# Patient Record
Sex: Female | Born: 1955
Health system: Southern US, Community
[De-identification: ages and names within clinical notes are randomized; demographics above are authoritative.]

## PROBLEM LIST (undated history)

## (undated) DIAGNOSIS — K219 Gastro-esophageal reflux disease without esophagitis: Secondary | ICD-10-CM

## (undated) DIAGNOSIS — G5600 Carpal tunnel syndrome, unspecified upper limb: Secondary | ICD-10-CM

## (undated) DIAGNOSIS — M545 Low back pain, unspecified: Secondary | ICD-10-CM

## (undated) DIAGNOSIS — K259 Gastric ulcer, unspecified as acute or chronic, without hemorrhage or perforation: Secondary | ICD-10-CM

## (undated) DIAGNOSIS — G43909 Migraine, unspecified, not intractable, without status migrainosus: Secondary | ICD-10-CM

## (undated) DIAGNOSIS — R232 Flushing: Secondary | ICD-10-CM

## (undated) DIAGNOSIS — M79671 Pain in right foot: Secondary | ICD-10-CM

## (undated) DIAGNOSIS — I10 Essential (primary) hypertension: Secondary | ICD-10-CM

## (undated) DIAGNOSIS — M62838 Other muscle spasm: Secondary | ICD-10-CM

## (undated) DIAGNOSIS — M79672 Pain in left foot: Secondary | ICD-10-CM

## (undated) DIAGNOSIS — F329 Major depressive disorder, single episode, unspecified: Secondary | ICD-10-CM

## (undated) HISTORY — DX: Pain in left foot: M79.672

## (undated) HISTORY — PX: REVISION TOTAL KNEE ARTHROPLASTY: SHX767

## (undated) HISTORY — DX: Gastric ulcer, unspecified as acute or chronic, without hemorrhage or perforation: K25.9

## (undated) HISTORY — DX: Pain in right foot: M79.671

## (undated) HISTORY — DX: Low back pain, unspecified: M54.50

## (undated) HISTORY — DX: Gastro-esophageal reflux disease without esophagitis: K21.9

## (undated) HISTORY — DX: Low back pain: M54.5

## (undated) HISTORY — DX: Carpal tunnel syndrome, unspecified upper limb: G56.00

## (undated) HISTORY — PX: TONSILLECTOMY: SUR1361

## (undated) HISTORY — DX: Major depressive disorder, single episode, unspecified: F32.9

## (undated) HISTORY — DX: Flushing: R23.2

## (undated) HISTORY — PX: CARPAL TUNNEL RELEASE: SHX101

## (undated) HISTORY — DX: Other muscle spasm: M62.838

## (undated) HISTORY — DX: Migraine, unspecified, not intractable, without status migrainosus: G43.909

## (undated) HISTORY — DX: Essential (primary) hypertension: I10

---

## 1999-02-22 ENCOUNTER — Other Ambulatory Visit: Admission: RE | Admit: 1999-02-22 | Discharge: 1999-02-22 | Payer: Self-pay | Admitting: *Deleted

## 2000-10-22 ENCOUNTER — Encounter (INDEPENDENT_AMBULATORY_CARE_PROVIDER_SITE_OTHER): Payer: Self-pay | Admitting: Specialist

## 2000-10-22 ENCOUNTER — Ambulatory Visit (HOSPITAL_BASED_OUTPATIENT_CLINIC_OR_DEPARTMENT_OTHER): Admission: RE | Admit: 2000-10-22 | Discharge: 2000-10-22 | Payer: Self-pay | Admitting: Plastic Surgery

## 2001-02-11 ENCOUNTER — Other Ambulatory Visit: Admission: RE | Admit: 2001-02-11 | Discharge: 2001-02-11 | Payer: Self-pay | Admitting: Gynecology

## 2001-09-30 ENCOUNTER — Ambulatory Visit (HOSPITAL_COMMUNITY): Admission: RE | Admit: 2001-09-30 | Discharge: 2001-09-30 | Payer: Self-pay | Admitting: Gastroenterology

## 2002-05-18 ENCOUNTER — Other Ambulatory Visit: Admission: RE | Admit: 2002-05-18 | Discharge: 2002-05-18 | Payer: Self-pay | Admitting: Gynecology

## 2004-01-25 ENCOUNTER — Ambulatory Visit: Payer: Self-pay | Admitting: Internal Medicine

## 2004-02-08 ENCOUNTER — Ambulatory Visit: Payer: Self-pay | Admitting: Internal Medicine

## 2004-05-10 ENCOUNTER — Other Ambulatory Visit: Admission: RE | Admit: 2004-05-10 | Discharge: 2004-05-10 | Payer: Self-pay | Admitting: Gynecology

## 2005-03-23 ENCOUNTER — Other Ambulatory Visit: Admission: RE | Admit: 2005-03-23 | Discharge: 2005-03-23 | Payer: Self-pay | Admitting: Obstetrics and Gynecology

## 2006-01-01 ENCOUNTER — Ambulatory Visit: Payer: Self-pay | Admitting: Internal Medicine

## 2006-01-08 ENCOUNTER — Encounter: Admission: RE | Admit: 2006-01-08 | Discharge: 2006-01-08 | Payer: Self-pay | Admitting: Internal Medicine

## 2006-02-04 ENCOUNTER — Ambulatory Visit: Payer: Self-pay | Admitting: Internal Medicine

## 2006-07-16 ENCOUNTER — Ambulatory Visit: Payer: Self-pay | Admitting: Internal Medicine

## 2006-09-02 ENCOUNTER — Ambulatory Visit: Payer: Self-pay | Admitting: Internal Medicine

## 2006-09-02 LAB — CONVERTED CEMR LAB
AST: 18 units/L (ref 0–37)
Albumin: 3.6 g/dL (ref 3.5–5.2)
Alkaline Phosphatase: 85 units/L (ref 39–117)
Basophils Absolute: 0 10*3/uL (ref 0.0–0.1)
Chloride: 113 meq/L — ABNORMAL HIGH (ref 96–112)
Creatinine, Ser: 0.8 mg/dL (ref 0.4–1.2)
Eosinophils Relative: 2.5 % (ref 0.0–5.0)
HCT: 38.5 % (ref 36.0–46.0)
Hgb A1c MFr Bld: 5.6 % (ref 4.6–6.0)
LDL Cholesterol: 96 mg/dL (ref 0–99)
MCHC: 35.4 g/dL (ref 30.0–36.0)
Neutrophils Relative %: 56.6 % (ref 43.0–77.0)
RBC: 4.4 M/uL (ref 3.87–5.11)
RDW: 12.3 % (ref 11.5–14.6)
Sodium: 143 meq/L (ref 135–145)
Total Bilirubin: 0.5 mg/dL (ref 0.3–1.2)
Total CHOL/HDL Ratio: 3.5
WBC: 6.1 10*3/uL (ref 4.5–10.5)

## 2006-09-10 ENCOUNTER — Ambulatory Visit: Payer: Self-pay | Admitting: Internal Medicine

## 2006-09-30 DIAGNOSIS — J309 Allergic rhinitis, unspecified: Secondary | ICD-10-CM | POA: Insufficient documentation

## 2006-10-16 ENCOUNTER — Ambulatory Visit: Payer: Self-pay | Admitting: Internal Medicine

## 2007-03-24 ENCOUNTER — Ambulatory Visit: Payer: Self-pay | Admitting: Internal Medicine

## 2007-03-24 DIAGNOSIS — N951 Menopausal and female climacteric states: Secondary | ICD-10-CM | POA: Insufficient documentation

## 2007-03-24 DIAGNOSIS — F32A Depression, unspecified: Secondary | ICD-10-CM | POA: Insufficient documentation

## 2007-03-24 DIAGNOSIS — R51 Headache: Secondary | ICD-10-CM | POA: Insufficient documentation

## 2007-03-24 DIAGNOSIS — F339 Major depressive disorder, recurrent, unspecified: Secondary | ICD-10-CM | POA: Insufficient documentation

## 2007-03-24 DIAGNOSIS — R519 Headache, unspecified: Secondary | ICD-10-CM | POA: Insufficient documentation

## 2007-03-24 DIAGNOSIS — F329 Major depressive disorder, single episode, unspecified: Secondary | ICD-10-CM | POA: Insufficient documentation

## 2007-03-24 HISTORY — DX: Depression, unspecified: F32.A

## 2007-05-20 ENCOUNTER — Ambulatory Visit: Payer: Self-pay | Admitting: Internal Medicine

## 2007-05-20 DIAGNOSIS — I10 Essential (primary) hypertension: Secondary | ICD-10-CM | POA: Insufficient documentation

## 2007-07-28 ENCOUNTER — Encounter: Payer: Self-pay | Admitting: Internal Medicine

## 2007-09-10 ENCOUNTER — Telehealth: Payer: Self-pay | Admitting: Internal Medicine

## 2007-09-11 ENCOUNTER — Ambulatory Visit: Payer: Self-pay | Admitting: Internal Medicine

## 2007-10-09 ENCOUNTER — Ambulatory Visit: Payer: Self-pay | Admitting: Internal Medicine

## 2007-12-24 ENCOUNTER — Encounter: Payer: Self-pay | Admitting: Internal Medicine

## 2008-01-21 ENCOUNTER — Telehealth: Payer: Self-pay | Admitting: Internal Medicine

## 2008-01-22 ENCOUNTER — Ambulatory Visit: Payer: Self-pay | Admitting: Internal Medicine

## 2008-01-29 ENCOUNTER — Telehealth: Payer: Self-pay | Admitting: *Deleted

## 2008-02-11 ENCOUNTER — Telehealth: Payer: Self-pay | Admitting: *Deleted

## 2008-02-18 ENCOUNTER — Ambulatory Visit: Payer: Self-pay

## 2008-10-11 ENCOUNTER — Encounter: Payer: Self-pay | Admitting: Internal Medicine

## 2009-01-27 ENCOUNTER — Telehealth: Payer: Self-pay | Admitting: Internal Medicine

## 2009-02-15 ENCOUNTER — Ambulatory Visit: Payer: Self-pay | Admitting: Internal Medicine

## 2009-12-21 ENCOUNTER — Encounter: Payer: Self-pay | Admitting: Internal Medicine

## 2010-01-09 ENCOUNTER — Telehealth: Payer: Self-pay | Admitting: Internal Medicine

## 2010-02-24 ENCOUNTER — Ambulatory Visit: Payer: Self-pay | Admitting: Family Medicine

## 2010-03-10 ENCOUNTER — Telehealth: Payer: Self-pay | Admitting: Internal Medicine

## 2010-04-11 NOTE — Consult Note (Signed)
Summary: [facility]  [facility] [place]   Imported By: Maryln Gottron 12/29/2009 13:54:09  _____________________________________________________________________  External Attachment:    Type:   Image     Comment:   External Document

## 2010-04-11 NOTE — Progress Notes (Signed)
Summary: see my son tomorrow?  Phone Note Call from Patient   Caller: vm Summary of Call: Need sport physical for my son.  Any way I can get him in tomorrow pm?  Debra Trevino, 4 yr. old, Also to schedulers.   Initial call taken by: Rudy Jew, RN,  January 09, 2010 4:51 PM  Follow-up for Phone Call        may add at 4 pm Follow-up by: Stacie Glaze MD,  January 09, 2010 5:06 PM

## 2010-04-13 NOTE — Assessment & Plan Note (Signed)
Summary: EAR PAIN // RS   Vital Signs:  Patient profile:   55 year old female Weight:      148 pounds Temp:     97.8 degrees F oral BP sitting:   112 / 70  (left arm) Cuff size:   regular  Vitals Entered By: Sid Falcon LPN (February 24, 2010 2:47 PM)  Hearing Screen  20db HL: Left  500 hz: 20db 1000 hz: 20db 2000 hz: 20db 4000 hz: 5 Right  500 hz: 15 1000 hz: 15 2000 hz: 15 4000 hz: 10   Hearing Testing Entered By: Sid Falcon LPN (February 24, 2010 3:14 PM)   History of Present Illness: Several months hx of R ear pain and pressure.  ?mild loss of hearing. No vertigo.  Chronic sinus congestion.  No tinnitus. Pain is poorly localized.  Not exacerbated with eating. Aleve helped.  No other alleviating factors.  Allergies: 1)  ! Penicillin 2)  ! Codeine  Past History:  Past Medical History: Last updated: 05/20/2007 Allergic rhinitis memnopause Depression Headache carple tunnel Hypertension  Past Surgical History: Last updated: 09/30/2006 Tonsillectomy EGD-09/30/2001  Family History: Last updated: 03/24/2007 mother    MVA   renal cell CA father    CMT neuromuscular dz  Social History: Last updated: 09/30/2006 Never Smoked Alcohol use-yes Drug use-no Regular exercise-yes Married  Risk Factors: Exercise: yes (09/30/2006)  Risk Factors: Smoking Status: never (02/15/2009) Passive Smoke Exposure: no (03/24/2007) PMH-FH-SH reviewed for relevance  Review of Systems  The patient denies anorexia, fever, weight loss, vision loss, chest pain, and headaches.    Physical Exam  General:  Well-developed,well-nourished,in no acute distress; alert,appropriate and cooperative throughout examination Head:  Normocephalic and atraumatic without obvious abnormalities. No apparent alopecia or balding.  Ears:  External ear exam shows no significant lesions or deformities.  Otoscopic examination reveals clear canals, tympanic membranes are intact bilaterally  without bulging, retraction, inflammation or discharge. Hearing is grossly normal bilaterally. Mouth:  Oral mucosa and oropharynx without lesions or exudates.  Teeth in good repair.  Click with palpation over R TMJ.\par Neck:  No deformities, masses, or tenderness noted. Lungs:  Normal respiratory effort, chest expands symmetrically. Lungs are clear to auscultation, no crackles or wheezes. Heart:  Normal rate and regular rhythm. S1 and S2 normal without gallop, murmur, click, rub or other extra sounds. Cervical Nodes:  No lymphadenopathy noted   Impression & Recommendations:  Problem # 1:  FACIAL PAIN, ATYPICAL (ICD-350.2) Assessment New suspect sec to TMJ.  No signs of ear infection. Try antiinflammatory.  consider night use of mouth guard. Oral surgeon referral if persists.  Problem # 2:  DECREASED HEARING (ICD-389.9) subjective.  Audiometry does not confirm hearing loss. Orders: Audiometry [phone])  Complete Medication List: 1)  Prempro 0.45-1.5 Mg Tabs (Conj estrog-medroxyprogest ace) .... .take as directed 2)  Ziac 2.5-6.25 Mg Tabs (Bisoprolol-hydrochlorothiazide) .... Once daily 3)  Pristiq 50 Mg Tb24 (Desvenlafaxine succinate) .... One by mouth daily 4)  Meloxicam 15 Mg Tabs (Meloxicam) .... One by mouth once daily as needed  Patient Instructions: 1)  Continue antiinflammatory such as Aleve. 2)  Avoid hard to chew foods. 3)  Consider mouth guard for any teeth grinding. Prescriptions: MELOXICAM 15 MG TABS (MELOXICAM) one by mouth once daily as needed  #30 x 2   Entered and Authorized by:   Evelena Peat MD   Signed by:   Evelena Peat MD on 02/24/2010   Method used:   Electronically to  [facility] # [address] (retail)       [address]       [place], [place]  [address]       Ph: [phone]       Fax: [phone]   RxID:   [id number]    Orders Added: 1)  Audiometry [92552] 2)  Est. Patient Level III [84132]

## 2010-04-13 NOTE — Progress Notes (Signed)
Summary: cough  Phone Note Call from Patient   Caller: Patient Call For: Dr. Caryl Never Reason for Call: Referral Summary of Call: [facility] [phone] Has had URI and now a non productive cough that keeps her up all night. Asking for a cough syrup. Initial call taken by: Lynann Beaver CMA AAMA,  March 10, 2010 10:56 AM  Follow-up for Phone Call        try Mucinex DM.  Needs to be seen if that is not sufficient Follow-up by: Evelena Peat MD,  March 10, 2010 1:09 PM    New/Updated Medications: MUCINEX DM 30-600 MG XR12H-TAB (DEXTROMETHORPHAN-GUAIFENESIN) as directed Prescriptions: MUCINEX DM 30-600 MG XR12H-TAB (DEXTROMETHORPHAN-GUAIFENESIN) as directed  #20 x prn   Entered by:   Lynann Beaver CMA AAMA   Authorized by:   Evelena Peat MD   Signed by:   Lynann Beaver CMA AAMA on 03/10/2010   Method used:   Electronically to        [id number] (retail)       [address]       [place], [place]  [address]       Ph: [phone]       Fax: [phone]   RxID:   [id number]

## 2010-04-13 NOTE — Therapy (Signed)
Summary: Audiometry/[facility]  Audiometry/[facility]   Imported By: Lanelle Bal 03/03/2010 09:16:27  _____________________________________________________________________  External Attachment:    Type:   Image     Comment:   External Document

## 2010-07-21 ENCOUNTER — Other Ambulatory Visit: Payer: Self-pay | Admitting: Internal Medicine

## 2010-07-28 NOTE — Op Note (Signed)
[place]  Patient:    Debra Trevino, Debra Trevino                         MRN: [id number] Proc. Date: 10/22/00 Attending:  Mary A. Contogiannis, M.D.                           Operative Report  PREOPERATIVE DIAGNOSIS:  A 1.3 cm basal cell carcinoma, left lower leg.  POSTOPERATIVE DIAGNOSIS:  A 1.5 cm basal cell carcinoma, left lower leg.  PROCEDURES: 1. Excision of 1.5 cm basal cell carcinoma, left lower leg, with    intraoperative frozen section diagnosis. 2. Complex closure of 4.5 cm left lower leg incision.  SURGEON:  Mary A. Contogiannis, M.D.  ANESTHESIA:  1% lidocaine with epinephrine.  COMPLICATIONS:  None.  INDICATIONS FOR PROCEDURE:  The patient is a 55 year old Caucasian female who had a skin lesion on the lower leg biopsied by Dr. Campbell Stall.  The biopsy indicates it is a basal cell carcinoma.  She presents to have this lesion re-excised.  DESCRIPTION OF PROCEDURE:  The patient was brought in the operating room and placed on the OR table in supine position.  The left lower extremity was prepped with Betadine and draped in a sterile fashion.  The skin and subcutaneous tissues in the area of the basal cell cancer were injected with 1% lidocaine with epinephrine.  After adequate hemostasis and anesthesia had taken effect, the procedure was begun.  Using loupe magnification, the skin lesion was excised with what appeared to be 1 mm of normal skin all around the cancer.  This was excised full-thickness through the skin into the subcutaneous tissues.  The specimen was then marked at the 12 oclock position and sent to the pathology department for an intraoperative frozen section diagnosis.  After consultation with the pathologist, Dr. Francoise Schaumann, he was able to clear the 12 and 6 oclock margins.  However, the pathologist noted that he could not fully clear the 3 and 9 oclock margins, mostly because of difficulty with preparing the frozen section  specimen.  He, however, felt that if I took another millimeter or so of skin for a margin in this area and sent it for permanent pathologic section evaluation, that I would be clear of the cancer.  This new margin would be too small to prepare an adequate frozen section on it.  I discussed this with the patient, and she understood and was in agreement with it.  I therefore proceeded to re-excise the 12 oclock to 6 oclock and then the 6 oclock to 12 oclock margins, which included the 3 oclock and 9 oclock margin, respectively.  These were then properly marked with ink on the internal aspect of the margin.  The wound was then undermined for closure.  The incision was closed in complex fashion.  The deeper subcutaneous and superficial fascial layers were closed using 3-0 Monocryl suture.  The dermal layer was then closed with 3-0 Monocryl suture.  The skin was closed with 4-0 Monocryl in an intracuticular stitch.  The incision did have to be lengthened a little in order to provide a nice closure for the patient.  The incision was then dressed with benzoin and Steri-Strips, followed by 4 x 4 gauze.  There were no complications.  The patient tolerated the procedure well.  The patient was then taught proper wound care and discharged home in stable condition.  Follow-up appointment will be tomorrow in the office. DD:  10/22/00 TD:  10/23/00 Job: [id number]

## 2010-09-05 ENCOUNTER — Other Ambulatory Visit: Payer: Self-pay | Admitting: Family Medicine

## 2010-09-05 ENCOUNTER — Other Ambulatory Visit: Payer: Self-pay | Admitting: Internal Medicine

## 2010-09-07 NOTE — Telephone Encounter (Signed)
rx already sent. 

## 2010-09-08 ENCOUNTER — Other Ambulatory Visit: Payer: Self-pay | Admitting: *Deleted

## 2010-09-08 MED ORDER — MELOXICAM 15 MG PO TABS: 15.0000 mg | ORAL_TABLET | Freq: Every day | ORAL | Status: DC

## 2010-11-25 ENCOUNTER — Other Ambulatory Visit: Payer: Self-pay | Admitting: Internal Medicine

## 2010-12-22 ENCOUNTER — Encounter: Payer: Self-pay | Admitting: Internal Medicine

## 2010-12-30 ENCOUNTER — Other Ambulatory Visit: Payer: Self-pay | Admitting: Internal Medicine

## 2011-02-20 ENCOUNTER — Other Ambulatory Visit: Payer: Self-pay | Admitting: Internal Medicine

## 2011-04-01 ENCOUNTER — Other Ambulatory Visit: Payer: Self-pay | Admitting: Internal Medicine

## 2011-04-19 ENCOUNTER — Other Ambulatory Visit: Payer: Self-pay | Admitting: Internal Medicine

## 2011-06-01 ENCOUNTER — Other Ambulatory Visit: Payer: Self-pay | Admitting: Internal Medicine

## 2011-07-01 ENCOUNTER — Other Ambulatory Visit: Payer: Self-pay | Admitting: Internal Medicine

## 2011-11-08 ENCOUNTER — Other Ambulatory Visit: Payer: Self-pay | Admitting: Internal Medicine

## 2011-11-13 ENCOUNTER — Telehealth: Payer: Self-pay | Admitting: Internal Medicine

## 2011-11-13 MED ORDER — LORAZEPAM 0.5 MG PO TABS: 0.5000 mg | ORAL_TABLET | Freq: Two times a day (BID) | ORAL | Status: AC | PRN

## 2011-11-13 MED ORDER — MELOXICAM 15 MG PO TABS: 15.0000 mg | ORAL_TABLET | Freq: Every day | ORAL | Status: DC

## 2011-11-13 NOTE — Telephone Encounter (Signed)
Caller: [name]/Patient; Patient Name: Debra Trevino; PCP: Darryll Capers (Adults only); Best Callback Phone Number: [phone]; Is having anxiety and is requesting medication for this and adjustment with Primpro 0.45-1.5. Initially was given the Primpro for headaches that controlled them but the headaches have started coming back-Primpro does not seem to help anymore. Also requesting refill of Mobic 15 mg. She believes Dr. Lovell Sheehan is the presciber for it.  She has a few pills left and only takes prn.  Life events with teenagers making patient not sleeping well and stomach is churning; Triaged using Anxiety with a disposition to see in 24 hours due to feeling overwhelmed and unable to calm down. Care advice given. Patient demonstrated understanding.  Per protocol, with Dr. Lovell Sheehan being out of office,  attempted to schedule in 24 hours with Adline Mango PA with no appointments available. Patient's last appointment with office was on 02/24/2010.  OFFICE: PLEASE FOLLOW UP WITH THE PATIENT FOR THE FOLLOWING; 1- HAS NEED TO BE SEEN WITHIN 24 HOURS FOR ANXIETY WITH NO APPOINTMENT AVAILABLE WITH EITHER PROVIDER TO BE USED PER PROTOCOL; 2-REQUESTING PRIMPRO 0.45-1.5 TO POSSIBLE BE ADJUSTED BECAUSE IS NO LONGER CONTROLLING HEADACHES; 3-MOBIC 15 MG NEEDS REFILLING-PATIENT HAS SEVERAL LEFT AND USES PRN.  DR. Lovell Sheehan PRESCRIBED FOR BOTH MEDICA TONS.  LAST APPOINTMENT IS 02/24/2010.  [facility] AT [phone].

## 2011-11-13 NOTE — Telephone Encounter (Signed)
Per Dr. Amador Cunas pt's Mobic can be refilled and lorazepam 0.5 mg take bid can be called in for anxiety. Pt is to schedule f/u with Dr. Lovell Sheehan in 2-4 weeks.  Called and spoke with pt and pt is aware.    Mobic and Lorazepam called in to pharmacy.

## 2011-11-27 ENCOUNTER — Encounter: Payer: Self-pay | Admitting: Internal Medicine

## 2011-11-27 ENCOUNTER — Ambulatory Visit (INDEPENDENT_AMBULATORY_CARE_PROVIDER_SITE_OTHER): Payer: BC Managed Care – PPO | Admitting: Internal Medicine

## 2011-11-27 VITALS — BP 110/70 | HR 72 | Temp 98.2°F | Resp 16 | Ht 65.0 in | Wt 144.0 lb

## 2011-11-27 DIAGNOSIS — Z23 Encounter for immunization: Secondary | ICD-10-CM

## 2011-11-27 DIAGNOSIS — I10 Essential (primary) hypertension: Secondary | ICD-10-CM

## 2011-11-27 DIAGNOSIS — G44019 Episodic cluster headache, not intractable: Secondary | ICD-10-CM

## 2011-11-27 DIAGNOSIS — N951 Menopausal and female climacteric states: Secondary | ICD-10-CM

## 2011-11-27 MED ORDER — CITALOPRAM HYDROBROMIDE 20 MG PO TABS: 10.0000 mg | ORAL_TABLET | Freq: Every day | ORAL | Status: DC

## 2011-11-27 MED ORDER — ALPRAZOLAM 0.25 MG PO TABS: 0.2500 mg | ORAL_TABLET | Freq: Two times a day (BID) | ORAL | Status: DC | PRN

## 2011-11-27 MED ORDER — CONJ ESTROG-MEDROXYPROGEST ACE 0.625-2.5 MG PO TABS: 1.0000 | ORAL_TABLET | Freq: Every day | ORAL | Status: DC

## 2011-11-27 NOTE — Progress Notes (Signed)
Subjective:    Patient ID: Debra Trevino, female    DOB: 08-12-1955, 56 y.o.   MRN: [id number]  HPI Increased HA with prior relationship to HRT dosing also complicated by increased stressors in her life she states that she may children have caused quite a bit of stress and anxiety lately and she is having episodic panic attacks.  She called prior to this office visit to reestablish and asks for something to be called in Ativan was called and she states that she had minor hallucinosis with Ativan     Review of Systems  Constitutional: Negative for activity change, appetite change and fatigue.  HENT: Negative for ear pain, congestion, neck pain, postnasal drip and sinus pressure.   Eyes: Negative for redness and visual disturbance.  Respiratory: Negative for cough, shortness of breath and wheezing.   Gastrointestinal: Negative for abdominal pain and abdominal distention.  Genitourinary: Negative for dysuria, frequency and menstrual problem.  Musculoskeletal: Negative for myalgias, joint swelling and arthralgias.  Skin: Negative for rash and wound.  Neurological: Negative for dizziness, weakness and headaches.  Hematological: Negative for adenopathy. Does not bruise/bleed easily.  Psychiatric/Behavioral: Negative for disturbed wake/sleep cycle and decreased concentration.   No past medical history on file.  History   Social History  . Marital Status: Married    Spouse Name: N/A    Number of Children: N/A  . Years of Education: N/A   Occupational History  . Not on file.   Social History Main Topics  . Smoking status: Never Smoker   . Smokeless tobacco: Not on file  . Alcohol Use: Not on file  . Drug Use: Not on file  . Sexually Active: Not on file   Other Topics Concern  . Not on file   Social History Narrative  . No narrative on file    No past surgical history on file.  No family history on file.  Allergies  Allergen Reactions  . Codeine   . Penicillins      Current Outpatient Prescriptions on File Prior to Visit  Medication Sig Dispense Refill  . bisoprolol-hydrochlorothiazide (ZIAC) 2.5-6.25 MG per tablet TAKE ONE TABLET BY MOUTH ONE TIME DAILY  30 tablet  6  . citalopram (CELEXA) 20 MG tablet Take 0.5 tablets (10 mg total) by mouth daily.  30 tablet  3  . meloxicam (MOBIC) 15 MG tablet Take 1 tablet (15 mg total) by mouth daily.  30 tablet  1    BP 110/70  Pulse 72  Temp 98.2 F (36.8 C)  Resp 16  Ht 5\' 5"  (1.651 m)  Wt 144 lb (65.318 kg)  BMI 23.96 kg/m2        Objective:   Physical Exam  Constitutional: She is oriented to person, place, and time. She appears well-developed and well-nourished. No distress.  HENT:  Head: Normocephalic and atraumatic.  Right Ear: External ear normal.  Left Ear: External ear normal.  Nose: Nose normal.  Mouth/Throat: Oropharynx is clear and moist.  Eyes: Conjunctivae normal and EOM are normal. Pupils are equal, round, and reactive to light.  Neck: Normal range of motion. Neck supple. No JVD present. No tracheal deviation present. No thyromegaly present.  Cardiovascular: Normal rate, regular rhythm, normal heart sounds and intact distal pulses.   No murmur heard. Pulmonary/Chest: Effort normal and breath sounds normal. She has no wheezes. She exhibits no tenderness.  Abdominal: Soft. Bowel sounds are normal.  Musculoskeletal: Normal range of motion. She exhibits no edema and no  tenderness.  Lymphadenopathy:    She has no cervical adenopathy.  Neurological: She is alert and oriented to person, place, and time. She has normal reflexes. No cranial nerve deficit.  Skin: Skin is warm and dry. She is not diaphoretic.  Psychiatric: She has a normal mood and affect. Her behavior is normal.          Assessment & Plan:  Plan is to treat the underlying anxiety disorder with low-dose Celexa and see if her hormones and flashes even out on a serotonin uptake inhibitor if they do not then we will  titrate the Prempro to the next dose 6.25 of Premarin.  We discussed implications of panic anxiety postmenopausal disorders and vasomotor motor symptoms and the risks and benefits of hormone replacement versus treatment of anxiety.  I have spent more than 30 minutes examining this patient face-to-face of which over half was spent in counseling

## 2011-11-27 NOTE — Patient Instructions (Addendum)
The patient is instructed to continue all medications as prescribed. Schedule followup with check out clerk upon leaving the clinic  

## 2012-01-11 ENCOUNTER — Other Ambulatory Visit: Payer: Self-pay | Admitting: Internal Medicine

## 2012-01-28 ENCOUNTER — Ambulatory Visit: Payer: BC Managed Care – PPO | Admitting: Internal Medicine

## 2012-05-28 ENCOUNTER — Other Ambulatory Visit: Payer: Self-pay | Admitting: Internal Medicine

## 2012-06-05 ENCOUNTER — Other Ambulatory Visit: Payer: Self-pay | Admitting: Internal Medicine

## 2012-07-23 ENCOUNTER — Other Ambulatory Visit: Payer: Self-pay | Admitting: Internal Medicine

## 2012-09-30 ENCOUNTER — Other Ambulatory Visit: Payer: Self-pay | Admitting: Internal Medicine

## 2012-11-17 ENCOUNTER — Telehealth: Payer: Self-pay | Admitting: Internal Medicine

## 2012-11-17 NOTE — Telephone Encounter (Signed)
Ok with me 

## 2012-11-17 NOTE — Telephone Encounter (Signed)
PT would like to see Dr. Caryl Never for her physical, due to Dr. Lovell Sheehan availability. May I schedule this?

## 2012-11-18 ENCOUNTER — Other Ambulatory Visit: Payer: Self-pay | Admitting: Internal Medicine

## 2012-11-18 NOTE — Telephone Encounter (Signed)
Scheduled

## 2012-11-27 ENCOUNTER — Other Ambulatory Visit (INDEPENDENT_AMBULATORY_CARE_PROVIDER_SITE_OTHER): Payer: BC Managed Care – PPO

## 2012-11-27 ENCOUNTER — Other Ambulatory Visit: Payer: Self-pay | Admitting: Internal Medicine

## 2012-11-27 DIAGNOSIS — Z0000 Encounter for general adult medical examination without abnormal findings: Secondary | ICD-10-CM

## 2012-11-27 LAB — TSH: TSH: 1.66 u[IU]/mL (ref 0.35–5.50)

## 2012-11-27 LAB — HEPATIC FUNCTION PANEL
ALT: 19 U/L (ref 0–35)
AST: 18 U/L (ref 0–37)
Bilirubin, Direct: 0 mg/dL (ref 0.0–0.3)
Total Bilirubin: 0.2 mg/dL — ABNORMAL LOW (ref 0.3–1.2)

## 2012-11-27 LAB — BASIC METABOLIC PANEL
BUN: 12 mg/dL (ref 6–23)
CO2: 27 mEq/L (ref 19–32)
Chloride: 107 mEq/L (ref 96–112)
Creatinine, Ser: 0.7 mg/dL (ref 0.4–1.2)
Glucose, Bld: 82 mg/dL (ref 70–99)

## 2012-11-27 LAB — CBC WITH DIFFERENTIAL/PLATELET
Eosinophils Absolute: 0.2 10*3/uL (ref 0.0–0.7)
MCHC: 34.2 g/dL (ref 30.0–36.0)
MCV: 90.8 fl (ref 78.0–100.0)
Monocytes Absolute: 0.7 10*3/uL (ref 0.1–1.0)
Neutrophils Relative %: 58.5 % (ref 43.0–77.0)
Platelets: 235 10*3/uL (ref 150.0–400.0)
RDW: 13.3 % (ref 11.5–14.6)

## 2012-11-27 LAB — POCT URINALYSIS DIPSTICK
Bilirubin, UA: NEGATIVE
Blood, UA: NEGATIVE
Glucose, UA: NEGATIVE
Nitrite, UA: NEGATIVE
Spec Grav, UA: 1.025

## 2012-11-27 LAB — LIPID PANEL
Cholesterol: 150 mg/dL (ref 0–200)
Triglycerides: 72 mg/dL (ref 0.0–149.0)

## 2012-12-04 ENCOUNTER — Ambulatory Visit (INDEPENDENT_AMBULATORY_CARE_PROVIDER_SITE_OTHER): Payer: BC Managed Care – PPO | Admitting: Family Medicine

## 2012-12-04 ENCOUNTER — Encounter: Payer: Self-pay | Admitting: Family Medicine

## 2012-12-04 VITALS — BP 118/70 | HR 64 | Temp 97.8°F | Wt 155.0 lb

## 2012-12-04 DIAGNOSIS — Z23 Encounter for immunization: Secondary | ICD-10-CM

## 2012-12-04 DIAGNOSIS — Z0000 Encounter for general adult medical examination without abnormal findings: Secondary | ICD-10-CM

## 2012-12-04 MED ORDER — CITALOPRAM HYDROBROMIDE 20 MG PO TABS: ORAL_TABLET | ORAL | Status: DC

## 2012-12-04 MED ORDER — MELOXICAM 15 MG PO TABS: 15.0000 mg | ORAL_TABLET | Freq: Every day | ORAL | Status: DC

## 2012-12-04 NOTE — Patient Instructions (Addendum)
Schedule mammogram Check on insurance coverage for shingles vaccine Confirm date of last colonoscopy Set up GYN followup regarding Pap smear

## 2012-12-04 NOTE — Progress Notes (Signed)
  Subjective:    Patient ID: Debra Trevino, female    DOB: Aug 20, 1955, 57 y.o.   MRN: [id number]  HPI Patient seen for complete physical. She plans to set up GYN followup soon. She states she had colonoscopy less than 10 years ago. Nonsmoker. Plays tennis for exercise. Needs flu vaccine. Last tetanus over 10 years ago. Last mammogram 2 years ago.  Family history reviewed. No family history of premature CAD. Patient sees dermatologist yearly for skin cancer screening.  No past medical history on file. No past surgical history on file.  reports that she has never smoked. She does not have any smokeless tobacco history on file. Her alcohol and drug histories are not on file. family history is not on file. Allergies  Allergen Reactions  . Codeine   . Penicillins       Review of Systems  Constitutional: Negative for fever, activity change, appetite change, fatigue and unexpected weight change.  HENT: Negative for hearing loss, ear pain, sore throat and trouble swallowing.   Eyes: Negative for visual disturbance.  Respiratory: Negative for cough and shortness of breath.   Cardiovascular: Negative for chest pain and palpitations.  Gastrointestinal: Negative for abdominal pain, diarrhea, constipation and blood in stool.  Endocrine: Negative for polydipsia and polyuria.  Genitourinary: Negative for dysuria and hematuria.  Musculoskeletal: Negative for myalgias, back pain and arthralgias.  Skin: Negative for rash.  Neurological: Negative for dizziness, syncope and headaches.  Hematological: Negative for adenopathy.  Psychiatric/Behavioral: Negative for confusion and dysphoric mood.       Objective:   Physical Exam  Constitutional: She is oriented to person, place, and time. She appears well-developed and well-nourished.  HENT:  Head: Normocephalic and atraumatic.  Eyes: EOM are normal. Pupils are equal, round, and reactive to light.  Neck: Normal range of motion. Neck supple. No  thyromegaly present.  Cardiovascular: Normal rate, regular rhythm and normal heart sounds.   No murmur heard. Pulmonary/Chest: Breath sounds normal. No respiratory distress. She has no wheezes. She has no rales.  Abdominal: Soft. Bowel sounds are normal. She exhibits no distension and no mass. There is no tenderness. There is no rebound and no guarding.  Genitourinary:  Per GYN  Musculoskeletal: Normal range of motion. She exhibits no edema.  Lymphadenopathy:    She has no cervical adenopathy.  Neurological: She is alert and oriented to person, place, and time. She displays normal reflexes. No cranial nerve deficit.  Skin: No rash noted.  Psychiatric: She has a normal mood and affect. Her behavior is normal. Judgment and thought content normal.          Assessment & Plan:  Complete physical. Vaccine given. Tetanus booster given. She will check on coverage for shingles vaccine. Set up mammogram. Set up GYN followup

## 2013-03-04 ENCOUNTER — Other Ambulatory Visit: Payer: Self-pay | Admitting: Internal Medicine

## 2013-03-31 ENCOUNTER — Other Ambulatory Visit: Payer: Self-pay | Admitting: Internal Medicine

## 2013-04-24 ENCOUNTER — Ambulatory Visit (INDEPENDENT_AMBULATORY_CARE_PROVIDER_SITE_OTHER): Payer: BC Managed Care – PPO | Admitting: Family Medicine

## 2013-04-24 ENCOUNTER — Telehealth: Payer: Self-pay | Admitting: Internal Medicine

## 2013-04-24 ENCOUNTER — Encounter: Payer: Self-pay | Admitting: Family Medicine

## 2013-04-24 VITALS — BP 120/64 | HR 71 | Temp 97.7°F | Ht 65.0 in | Wt 156.0 lb

## 2013-04-24 DIAGNOSIS — H8110 Benign paroxysmal vertigo, unspecified ear: Secondary | ICD-10-CM

## 2013-04-24 NOTE — Telephone Encounter (Signed)
Patient came into the office asking if someone could check her blood pressure. She said she was feeling dizzy all morning and did not take her blood pressure medicine because she was afraid to. I called [facility] to see if she could take her blood pressure and she told me that the patient would need an appointment. I told her what Stevie Kern had said to me and the pt told me she had to leave to pick up her son at [place]. I offered her an appointment for today and found an available appointment with Dr. Sarajane Jews at 1:30pm today, which I scheduled her for. It is now 12:28 and she told me that she would be back in an hour for her appointment and left.

## 2013-04-24 NOTE — Progress Notes (Signed)
   Subjective:    Patient ID: Debra Trevino, female    DOB: [birth year], 58 y.o.   MRN: [id number]  HPI Here for periods of "dizziness" which are difficult to classify. They started 2 months ago and have gradually been getting more frequent. Now for the past week she has been getting them every day. She describes them as feeling "out of sorts", sometimes lightheaded, sometimes just fatigue. She denies any chest pain or SOB or palpitations. No HA or blurred vision. Her BP is stable. She is active and plays a lot of tennis with no difficulties.    Review of Systems  Constitutional: Positive for fatigue.  HENT: Negative.   Eyes: Negative.   Respiratory: Negative.   Cardiovascular: Negative.   Gastrointestinal: Negative.   Endocrine: Negative.   Neurological: Positive for dizziness and light-headedness. Negative for tremors, seizures, syncope, facial asymmetry, speech difficulty, weakness, numbness and headaches.       Objective:   Physical Exam  Constitutional: She is oriented to person, place, and time. She appears well-developed and well-nourished.  HENT:  Right Ear: External ear normal.  Left Ear: External ear normal.  Nose: Nose normal.  Mouth/Throat: Oropharynx is clear and moist.  Eyes: Conjunctivae are normal. Pupils are equal, round, and reactive to light.  Neck: No thyromegaly present.  Cardiovascular: Normal rate, regular rhythm, normal heart sounds and intact distal pulses.   Pulmonary/Chest: Effort normal and breath sounds normal.  Lymphadenopathy:    She has no cervical adenopathy.  Neurological: She is alert and oriented to person, place, and time.          Assessment & Plan:  I am not sure how to characterize these symptoms. She may have very mild vertigo at times. Try Claritin D daily for the next 2 weeks. Recheck prn

## 2013-04-24 NOTE — Progress Notes (Signed)
Pre visit review using our clinic review tool, if applicable. No additional management support is needed unless otherwise documented below in the visit note. 

## 2013-06-29 ENCOUNTER — Other Ambulatory Visit: Payer: Self-pay | Admitting: Family Medicine

## 2013-07-03 ENCOUNTER — Other Ambulatory Visit: Payer: Self-pay | Admitting: Internal Medicine

## 2013-09-06 ENCOUNTER — Other Ambulatory Visit: Payer: Self-pay | Admitting: Internal Medicine

## 2013-10-02 ENCOUNTER — Other Ambulatory Visit: Payer: Self-pay | Admitting: Family Medicine

## 2014-01-22 ENCOUNTER — Other Ambulatory Visit: Payer: Self-pay | Admitting: Family Medicine

## 2014-03-11 ENCOUNTER — Other Ambulatory Visit: Payer: Self-pay | Admitting: Family Medicine

## 2014-04-07 ENCOUNTER — Other Ambulatory Visit: Payer: Self-pay

## 2014-04-07 NOTE — Telephone Encounter (Signed)
Rx request for Ziac table 2.5-6.25 mg tablet- Take 1 tablet by mouth one time daily #30  Pharm:  [facility]   Pls advise.

## 2014-04-08 MED ORDER — BISOPROLOL-HYDROCHLOROTHIAZIDE 2.5-6.25 MG PO TABS: 1.0000 | ORAL_TABLET | Freq: Every day | ORAL | Status: DC

## 2014-04-27 ENCOUNTER — Other Ambulatory Visit: Payer: Self-pay | Admitting: Family Medicine

## 2014-05-27 ENCOUNTER — Ambulatory Visit (INDEPENDENT_AMBULATORY_CARE_PROVIDER_SITE_OTHER)
Admission: RE | Admit: 2014-05-27 | Discharge: 2014-05-27 | Disposition: A | Discharge status: Home / Self Care | Payer: BLUE CROSS/BLUE SHIELD | Source: Ambulatory Visit | Attending: Family Medicine | Admitting: Family Medicine

## 2014-05-27 ENCOUNTER — Encounter: Payer: Self-pay | Admitting: Family Medicine

## 2014-05-27 ENCOUNTER — Ambulatory Visit (INDEPENDENT_AMBULATORY_CARE_PROVIDER_SITE_OTHER): Payer: BLUE CROSS/BLUE SHIELD | Admitting: Family Medicine

## 2014-05-27 VITALS — BP 112/68 | HR 63 | Temp 97.6°F | Ht 64.0 in | Wt 160.7 lb

## 2014-05-27 DIAGNOSIS — K219 Gastro-esophageal reflux disease without esophagitis: Secondary | ICD-10-CM

## 2014-05-27 DIAGNOSIS — R5382 Chronic fatigue, unspecified: Secondary | ICD-10-CM

## 2014-05-27 DIAGNOSIS — M25531 Pain in right wrist: Secondary | ICD-10-CM

## 2014-05-27 DIAGNOSIS — R232 Flushing: Secondary | ICD-10-CM

## 2014-05-27 DIAGNOSIS — N951 Menopausal and female climacteric states: Secondary | ICD-10-CM

## 2014-05-27 DIAGNOSIS — M545 Low back pain: Secondary | ICD-10-CM

## 2014-05-27 DIAGNOSIS — I10 Essential (primary) hypertension: Secondary | ICD-10-CM | POA: Diagnosis not present

## 2014-05-27 DIAGNOSIS — G8929 Other chronic pain: Secondary | ICD-10-CM

## 2014-05-27 DIAGNOSIS — F329 Major depressive disorder, single episode, unspecified: Secondary | ICD-10-CM

## 2014-05-27 DIAGNOSIS — F32A Depression, unspecified: Secondary | ICD-10-CM

## 2014-05-27 DIAGNOSIS — M62838 Other muscle spasm: Secondary | ICD-10-CM

## 2014-05-27 DIAGNOSIS — M25579 Pain in unspecified ankle and joints of unspecified foot: Secondary | ICD-10-CM

## 2014-05-27 HISTORY — DX: Gastro-esophageal reflux disease without esophagitis: K21.9

## 2014-05-27 LAB — BASIC METABOLIC PANEL
BUN: 15 mg/dL (ref 6–23)
CO2: 30 mEq/L (ref 19–32)
Calcium: 9.6 mg/dL (ref 8.4–10.5)
Chloride: 105 mEq/L (ref 96–112)
Creatinine, Ser: 0.89 mg/dL (ref 0.40–1.20)
GFR: 68.56 mL/min (ref >60.00)
Glucose, Bld: 103 mg/dL — ABNORMAL HIGH (ref 70–99)
POTASSIUM: 4.6 meq/L (ref 3.5–5.1)
SODIUM: 139 meq/L (ref 135–145)

## 2014-05-27 LAB — CBC
HEMATOCRIT: 41.5 % (ref 36.0–46.0)
Hemoglobin: 14.4 g/dL (ref 12.0–15.0)
MCHC: 34.7 g/dL (ref 30.0–36.0)
MCV: 90.7 fl (ref 78.0–100.0)
Platelets: 262 10*3/uL (ref 150.0–400.0)
RBC: 4.58 Mil/uL (ref 3.87–5.11)
RDW: 13.1 % (ref 11.5–15.5)
WBC: 7.6 10*3/uL (ref 4.0–10.5)

## 2014-05-27 LAB — TSH: TSH: 1.09 u[IU]/mL (ref 0.35–4.50)

## 2014-05-27 MED ORDER — TIZANIDINE HCL 2 MG PO CAPS: 2.0000 mg | ORAL_CAPSULE | Freq: Two times a day (BID) | ORAL | Status: DC | PRN

## 2014-05-27 NOTE — Progress Notes (Signed)
Pre visit review using our clinic review tool, if applicable. No additional management support is needed unless otherwise documented below in the visit note. 

## 2014-05-27 NOTE — Progress Notes (Signed)
HPI:  Debra Trevino is here to establish care. Used to see Dr. Arnoldo Morale. Has not had female gyn physical in some time. Was going to see gyn but would prefer to come here if possible.  Has no PMG in chart but reports many  chronic problems today:  GERD: -sees Dr. Earlean Shawl -has EGD scheduled next week with her GI doctor   Bilat Foot Pain: -plays tennis and overdid it -has morton neuroma -seeing podiatrist for this -meloxicam intermittently if has tennis match and wears orthotics and had inj  Chronic low back pain: -bilateral for a long time -constant mild, sometimes worse with certain acivites - tennis match, etc and uses valium for this - wants muscle relaxer to use prn -denies: radiation of back pain, weakness, numbness, bowel or bladder incon  Tight hamstrings: -takes muscle relaxer prn as needed -very active with tennis sometimes takes valium  CTS: -CTS R, saw hand specialist in the past - one told her to do surgery one told her not to -not using wrist braces  HTN: -meds: bisoprolol-hctz 2.5-6.25 -reports stable  Depressed Mood/Chronic fatigue: -anhedonia, depressed at time, feels like going back to sleep -on celexa for hot flashes  Sleep disorder: mild, wakes up at 3 Interest deficit/anhedonia: yes Guilt (worthlessness, hopelessness, regret):no Energy deficit:  yes Concentration deficit: yes Appetite disorder: no Psychomotor retardation or agitation: sluggish Suicidality: no -denies: fevers, weight loss, joint swelling  Hot flashes: -really bad every time she tries to wean the prempro  Migraines: -resolved on HRT for hot flashes  ROS negative for unless reported above: fevers, unintentional weight loss, hearing or vision loss, chest pain, palpitations, struggling to breath, hemoptysis, melena, hematochezia, hematuria, falls, loc, si, thoughts of self harm  Past Medical History  Diagnosis Date  . GERD (gastroesophageal reflux disease) 05/27/2014    Sees  Dr. Earlean Shawl   . Depression 03/24/2007    Qualifier: Diagnosis of  By: Arnoldo Morale MD, Balinda Quails   . Hot flashes     recur when stops HRT  . Migraines     resolved on HRT for hot flases  . HTN (hypertension)   . Bilateral foot pain     morton neuroma, seeing podiatrist  . Muscle spasm     very active, muscle spasm after tennis matches  . CTS (carpal tunnel syndrome)     has seen hand specilist  . Low back pain     chronic    Past Surgical History  Procedure Laterality Date  . Tonsillectomy      Family History  Problem Relation Age of Onset  . Charcot-Marie-Tooth disease Father   . Renal cancer Mother   . Hypertension    . Schizophrenia Brother     History   Social History  . Marital Status: Married    Spouse Name: N/A  . Number of Children: N/A  . Years of Education: N/A   Social History Main Topics  . Smoking status: Never Smoker   . Smokeless tobacco: Never Used  . Alcohol Use: Yes     Comment: glass of wine each day  . Drug Use: No  . Sexual Activity: Not on file   Other Topics Concern  . None   Social History Narrative   Work or School: 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      Home Situation: live with husband - boys off at college      Spiritual Beliefs: catholic      Lifestyle: lots  of exercise - tennis; diet is healthy           Current outpatient prescriptions:  .  aspirin 81 MG tablet, Take 81 mg by mouth daily., Disp: , Rfl:  .  bisoprolol-hydrochlorothiazide (ZIAC) 2.5-6.25 MG per tablet, Take 1 tablet by mouth daily., Disp: 30 tablet, Rfl: 2 .  citalopram (CELEXA) 20 MG tablet, TAKE ONE TABLET BY MOUTH ONE TIME DAILY , Disp: 30 tablet, Rfl: 0 .  meloxicam (MOBIC) 15 MG tablet, TAKE ONE TABLET BY MOUTH ONE TIME DAILY , Disp: 30 tablet, Rfl: 5 .  PREMPRO 0.45-1.5 MG per tablet, Take as instructed by physician and/or package instructions., Disp: 28 tablet, Rfl: 5 .  tizanidine (ZANAFLEX) 2 MG capsule, Take 1 capsule (2 mg total) by mouth 2  (two) times daily as needed for muscle spasms., Disp: 30 capsule, Rfl: 0  EXAM:  Filed Vitals:   05/27/14 1125  BP: 112/68  Pulse: 63  Temp: 97.6 F (36.4 C)    Body mass index is 27.57 kg/(m^2).  GENERAL: vitals reviewed and listed above, alert, oriented, appears well hydrated and in no acute distress  HEENT: atraumatic, conjunttiva clear, no obvious abnormalities on inspection of external nose and ears  NECK: no obvious masses on inspection  LUNGS: clear to auscultation bilaterally, no wheezes, rales or rhonchi, good air movement  CV: HRRR, no peripheral edema  MS: moves all extremities without noticeable abnormality, TTP in bilat lower lumbar paraspinal muscles, normal gait  PSYCH: pleasant and cooperative, no obvious depression or anxiety  ASSESSMENT AND PLAN:  Discussed the following assessment and plan:  Hot flashes -discussed risks long term HRT, she opted to continue  Essential hypertension -cont current tx, labs  Chronic low back pain - Plan: tizanidine (ZANAFLEX) 2 MG capsule, DG Lumbar Spine Complete -ok with prn muscle relaxer if used sparingly -discussed risks with nsaids - advised tylenol and decreasing use of Nsaids -advised plain films lumbar -given her multiple other MS, while unlikely, discuss and offered eval with rheum to rule out underlying systemic process, she prefers to hold off on this  Chronic fatigue - Plan: TSH, Basic metabolic panel, CBC (no diff) Depression -basic labs, CPE -discussed tx option for depression - may increase celexa or do CBT, she will let us know  Gastroesophageal reflux disease, esophagitis presence not specified  Pain in joint, ankle and foot, unspecified laterality -sees podiatrist  Right wrist pain -advised cock up brace as she is not interested in surgery, loose application  -We reviewed the PMH, PSH, FH, SH, Meds and Allergies. -We provided refills for any medications we will prescribe as needed. -We  addressed current concerns per orders and patient instructions. -We have asked for records for pertinent exams, studies, vaccines and notes from previous providers. -We have advised patient to follow up per instructions below.   -Patient advised to return or notify a doctor immediately if symptoms worsen or persist or new concerns arise.  Patient Instructions  BEFORE YOU LEAVE: -labs -physical in about 3 months - come fasting  -xray sheet -low back exercises  Vit D3 1000 IU daily  Where wrist brace very loosely at night  Back exercises at least 3 days per week  We recommend the following healthy lifestyle measures: - eat a healthy diet consisting of lots of vegetables, fruits, beans, nuts, seeds, healthy meats such as white chicken and fish and whole grains.  - avoid fried foods, fast food, processed foods, sodas, red meet and other fattening foods.  -  get a least 150 minutes of aerobic exercise per week.   Schedule mammogram  If you decide to see rheumatologist or sports medicine doctor please let me know        Colin Benton R.

## 2014-05-27 NOTE — Patient Instructions (Addendum)
BEFORE YOU LEAVE: -labs -physical in about 3 months - come fasting  -xray sheet -low back exercises  Vit D3 1000 IU daily  Where wrist brace very loosely at night  Back exercises at least 3 days per week  We recommend the following healthy lifestyle measures: - eat a healthy diet consisting of lots of vegetables, fruits, beans, nuts, seeds, healthy meats such as white chicken and fish and whole grains.  - avoid fried foods, fast food, processed foods, sodas, red meet and other fattening foods.  - get a least 150 minutes of aerobic exercise per week.   Schedule mammogram  If you decide to see rheumatologist or sports medicine doctor please let me know

## 2014-06-08 ENCOUNTER — Other Ambulatory Visit: Payer: Self-pay | Admitting: Family Medicine

## 2014-06-17 LAB — HM MAMMOGRAPHY: HM Mammogram: NEGATIVE

## 2014-06-22 ENCOUNTER — Encounter: Payer: Self-pay | Admitting: Family Medicine

## 2014-06-29 ENCOUNTER — Encounter: Payer: Self-pay | Admitting: Family Medicine

## 2014-07-08 ENCOUNTER — Telehealth: Payer: Self-pay | Admitting: *Deleted

## 2014-07-08 MED ORDER — CONJ ESTROG-MEDROXYPROGEST ACE 0.45-1.5 MG PO TABS: ORAL_TABLET | ORAL | Status: DC

## 2014-07-08 NOTE — Telephone Encounter (Signed)
Received fax from [place] requesting refill on Prempro 0.45-1.5 mg tablet.

## 2014-07-16 ENCOUNTER — Other Ambulatory Visit: Payer: Self-pay | Admitting: Specialist

## 2014-07-16 DIAGNOSIS — M545 Low back pain: Secondary | ICD-10-CM

## 2014-07-19 ENCOUNTER — Other Ambulatory Visit: Payer: Self-pay | Admitting: Family Medicine

## 2014-07-20 ENCOUNTER — Encounter: Payer: Self-pay | Admitting: Family Medicine

## 2014-07-20 ENCOUNTER — Other Ambulatory Visit: Payer: Self-pay | Admitting: *Deleted

## 2014-07-20 DIAGNOSIS — M549 Dorsalgia, unspecified: Principal | ICD-10-CM

## 2014-07-20 DIAGNOSIS — G8929 Other chronic pain: Secondary | ICD-10-CM

## 2014-07-29 ENCOUNTER — Ambulatory Visit
Admission: RE | Admit: 2014-07-29 | Discharge: 2014-07-29 | Disposition: A | Discharge status: Home / Self Care | Payer: BLUE CROSS/BLUE SHIELD | Source: Ambulatory Visit | Attending: Specialist | Admitting: Specialist

## 2014-07-29 DIAGNOSIS — M545 Low back pain: Secondary | ICD-10-CM

## 2014-08-16 ENCOUNTER — Ambulatory Visit (INDEPENDENT_AMBULATORY_CARE_PROVIDER_SITE_OTHER): Payer: BLUE CROSS/BLUE SHIELD | Admitting: Family Medicine

## 2014-08-16 ENCOUNTER — Encounter: Payer: Self-pay | Admitting: Family Medicine

## 2014-08-16 VITALS — BP 100/72 | HR 69 | Temp 97.5°F | Ht 63.75 in | Wt 156.3 lb

## 2014-08-16 DIAGNOSIS — Z0000 Encounter for general adult medical examination without abnormal findings: Secondary | ICD-10-CM | POA: Diagnosis not present

## 2014-08-16 DIAGNOSIS — K219 Gastro-esophageal reflux disease without esophagitis: Secondary | ICD-10-CM | POA: Diagnosis not present

## 2014-08-16 DIAGNOSIS — I10 Essential (primary) hypertension: Secondary | ICD-10-CM | POA: Diagnosis not present

## 2014-08-16 DIAGNOSIS — N951 Menopausal and female climacteric states: Secondary | ICD-10-CM

## 2014-08-16 DIAGNOSIS — F32A Depression, unspecified: Secondary | ICD-10-CM

## 2014-08-16 DIAGNOSIS — Z23 Encounter for immunization: Secondary | ICD-10-CM

## 2014-08-16 DIAGNOSIS — F329 Major depressive disorder, single episode, unspecified: Secondary | ICD-10-CM | POA: Diagnosis not present

## 2014-08-16 LAB — LIPID PANEL
CHOL/HDL RATIO: 3
Cholesterol: 172 mg/dL (ref 0–200)
HDL: 56.2 mg/dL (ref >39.00)
LDL CALC: 86 mg/dL (ref 0–99)
NonHDL: 115.8
Triglycerides: 149 mg/dL (ref 0.0–149.0)
VLDL: 29.8 mg/dL (ref 0.0–40.0)

## 2014-08-16 LAB — HEMOGLOBIN A1C: HEMOGLOBIN A1C: 5.5 % (ref 4.6–6.5)

## 2014-08-16 NOTE — Addendum Note (Signed)
Addended by: Agnes Lawrence on: 08/16/2014 10:18 AM   Modules accepted: Orders

## 2014-08-16 NOTE — Patient Instructions (Signed)
BEFORE YOU LEAVE: -labs -shingles vaccine -schedule follow up in 4-6 months  For pain use tylenol 500-1000mg  up to 3 times per day if needed for pain  Follow up with your gynecologist for pap, female exam and for management of your hot flashes  Can use AFRIN 2 times daily for 3 days for ear problems, do not use longer then 3 days   -We have ordered labs or studies at this visit. It can take up to 1-2 weeks for results and processing. We will contact you with instructions IF your results are abnormal. Normal results will be released to your [facility] If you have not heard from [place] or can not find your results in [facility] in 2 weeks please contact our office.  We recommend the following healthy lifestyle measures: - eat a healthy diet consisting of lots of vegetables, fruits, beans, nuts, seeds, healthy meats such as white chicken and fish and whole grains.  - avoid fried foods, fast food, processed foods, sodas, red meet and other fattening foods.  - get a least 150 minutes of aerobic exercise per week.

## 2014-08-16 NOTE — Addendum Note (Signed)
Addended by: Elmer Picker on: 08/16/2014 10:04 AM   Modules accepted: Orders

## 2014-08-16 NOTE — Progress Notes (Signed)
Pre visit review using our clinic review tool, if applicable. No additional management support is needed unless otherwise documented below in the visit note. 

## 2014-08-16 NOTE — Progress Notes (Signed)
HPI:  Here for CPE:  -Concerns and/or follow up today:   R ear: -infrequent pressure intermittently, for about 1 month -denies: pain, hearing loss, drainage -mild allergy symptoms - PND, nasal congestion -has not tried anything  GERD: -sees Dr. Earlean Shawl -reports had esophageal dilation in 2016, also has ulcers -was advised not to take asa and nsaids  Bilat Foot Pain: -has morton neuroma -seeing podiatrist for this -meloxicam intermittently in the past, now using tylenol  Chronic low back pain: -bilateral for a long time -referred to ortho after last visit per her request - MRI done per records, some transitional anatomy, anterolithesis l5-s1 and mild DDD -advised to minimize use of mobic and muscle relaxer  CTS: -CTS R, saw hand specialist in the past - one told her to do surgery one told her not to -not using wrist braces  HTN: -meds: bisoprolol-hctz 2.5-6.25 -reports stable  Depressed Mood/Chronic fatigue: -anhedonia, depressed at times, feels like going back to sleep -on celexa for hot flashes and this -stable  Hot flashes: -really bad every time she tries to wean the prempro -going to see gyn for this -concerned about side effects and told can't take asa anymore due to GI ulcers -? Hx DVT - reports very remote, not on PMH, reports treated with asa by prior PCP - Dr. Arnoldo Morale  Migraines: -resolved on HRT for hot flashes  -Diet: variety of foods, balance and well rounded, larger portion sizes  -Exercise: no regular exercise  -Taking folic acid, vitamin D or calcium: taking vitamin D  -Diabetes and Dyslipidemia Screening: FASTNG today  -Vaccines: wants shingles vaccine today  -pap history: plans to see Marylynn Pearson   -FDLMP: n/a  -sexual activity: yes, female partner, no new partners  -wants STI testing (Hep C if born [birth year]): no  -FH breast, colon or ovarian ca: see FH Last mammogram: done Last colon cancer screening: done  Sees gyn for  breast health  -Alcohol, Tobacco, drug use: see social history  Review of Systems - no fevers, unintentional weight loss, vision loss, hearing loss, chest pain, sob, hemoptysis, melena, hematochezia, hematuria, genital discharge, changing or concerning skin lesions, bleeding, bruising, loc, thoughts of self harm or SI  Past Medical History  Diagnosis Date  . GERD (gastroesophageal reflux disease) 05/27/2014    Sees Dr. Earlean Shawl   . Depression 03/24/2007    Qualifier: Diagnosis of  By: Arnoldo Morale MD, Balinda Quails   . Hot flashes     recur when stops HRT  . Migraines     resolved on HRT for hot flases  . HTN (hypertension)   . Bilateral foot pain     morton neuroma, seeing podiatrist  . Muscle spasm     very active, muscle spasm after tennis matches  . CTS (carpal tunnel syndrome)     has seen hand specilist  . Low back pain     chronic  . Gastric ulcer     per patient    Past Surgical History  Procedure Laterality Date  . Tonsillectomy      Family History  Problem Relation Age of Onset  . Charcot-Marie-Tooth disease Father   . Renal cancer Mother   . Hypertension    . Schizophrenia Brother     History   Social History  . Marital Status: Married    Spouse Name: N/A  . Number of Children: N/A  . Years of Education: N/A   Social History Main Topics  . Smoking status: Never Smoker   .  Smokeless tobacco: Never Used  . Alcohol Use: Yes     Comment: glass of wine each day  . Drug Use: No  . Sexual Activity: Not on file   Other Topics Concern  . None   Social History Narrative   Work or School: 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      Home Situation: live with husband - boys off at college      Spiritual Beliefs: catholic      Lifestyle: lots of exercise - tennis; diet is healthy           Current outpatient prescriptions:  .  bisoprolol-hydrochlorothiazide (ZIAC) 2.5-6.25 MG per tablet, TAKE ONE TABLET BY MOUTH ONE TIME DAILY, Disp: 30 tablet, Rfl: 2 .   citalopram (CELEXA) 20 MG tablet, TAKE ONE TABLET BY MOUTH ONE TIME DAILY , Disp: 90 tablet, Rfl: 1 .  estrogen, conjugated,-medroxyprogesterone (PREMPRO) 0.45-1.5 MG per tablet, Take as instructed by physician and/or package instructions., Disp: 28 tablet, Rfl: 5 .  tizanidine (ZANAFLEX) 2 MG capsule, Take 1 capsule (2 mg total) by mouth 2 (two) times daily as needed for muscle spasms., Disp: 30 capsule, Rfl: 0 .  traMADol (ULTRAM) 50 MG tablet, , Disp: , Rfl: 0  EXAM:  Filed Vitals:   08/16/14 0859  BP: 100/72  Pulse: 69  Temp: 97.5 F (36.4 C)    GENERAL: vitals reviewed and listed below, alert, oriented, appears well hydrated and in no acute distress  HEENT: head atraumatic, PERRLA, normal appearance of eyes, ears, nose and mouth. moist mucus membranes, normal appearance of ear canals and TMs, clear nasal congestion, mild post oropharyngeal erythema with PND, no tonsillar edema or exudate, no sinus TTP  NECK: supple, no masses or lymphadenopathy  LUNGS: clear to auscultation bilaterally, no rales, rhonchi or wheeze  CV: HRRR, no peripheral edema or cyanosis, normal pedal pulses  BREAST: declined  ABDOMEN: bowel sounds normal, soft, non tender to palpation, no masses, no rebound or guarding  GU: declined - does with gyn  SKIN: no rash or abnormal lesions  MS: normal gait, moves all extremities normally  NEURO: CN II-XII grossly intact, normal muscle strength and sensation to light touch on extremities  PSYCH: normal affect, pleasant and cooperative  ASSESSMENT AND PLAN:  Discussed the following assessment and plan:  Visit for preventive health examination -Discussed and advised all [place] preventive services health task force level A and B recommendations for age, sex and risks. -Advised at least 150 minutes of exercise per week and a healthy diet low in saturated fats and sweets and consisting of fresh fruits and vegetables, lean meats such as fish and white chicken and  whole grains. -FASTING labs, studies and vaccines per orders this encounter  Gastroesophageal reflux disease, esophagitis presence not specified -cont current tx per GI  Essential hypertension -cont current tx  Depression -discussed other options that may also help with hot flashes or increasing dose - she plans to discuss with gyn  [place] -see above -advised management with gyn given hx, if DVT would advise against this - but no mention of this in [place] and unlikely if treated with asa as this is not a usual tx for DVT -discussed and advised management with gyn  No orders of the defined types were placed in this encounter.    Patient advised to return to clinic immediately if symptoms worsen or persist or new concerns.  Patient Instructions  BEFORE YOU LEAVE: -labs -shingles  vaccine -schedule follow up in 4-6 months  For pain use tylenol 500-1000mg  up to 3 times per day if needed for pain  Follow up with your gynecologist for pap, female exam and for management of your hot flashes  Can use AFRIN 2 times daily for 3 days for ear problems, do not use longer then 3 days   -We have ordered labs or studies at this visit. It can take up to 1-2 weeks for results and processing. We will contact you with instructions IF your results are abnormal. Normal results will be released to your [facility]. If you have not heard from [place] or can not find your results in [facility] in 2 weeks please contact our office.  We recommend the following healthy lifestyle measures: - eat a healthy diet consisting of lots of vegetables, fruits, beans, nuts, seeds, healthy meats such as white chicken and fish and whole grains.  - avoid fried foods, fast food, processed foods, sodas, red meet and other fattening foods.  - get a least 150 minutes of aerobic exercise per week.         No Follow-up on file.  Colin Benton R.

## 2014-08-17 ENCOUNTER — Encounter: Payer: Self-pay | Admitting: Family Medicine

## 2014-10-01 ENCOUNTER — Encounter: Payer: Self-pay | Admitting: Family Medicine

## 2014-10-17 ENCOUNTER — Other Ambulatory Visit: Payer: Self-pay | Admitting: Family Medicine

## 2014-10-20 ENCOUNTER — Telehealth: Payer: Self-pay | Admitting: Cardiovascular Disease

## 2014-10-20 NOTE — Telephone Encounter (Signed)
Received records from [place] for appointment on 12/14/14 with Dr Claiborne Billings.  Records given to [facility] (medical records) for Dr Evette Georges schedule on 12/14/14. lp

## 2014-11-23 ENCOUNTER — Ambulatory Visit (INDEPENDENT_AMBULATORY_CARE_PROVIDER_SITE_OTHER): Payer: BLUE CROSS/BLUE SHIELD | Admitting: Cardiovascular Disease

## 2014-11-23 ENCOUNTER — Encounter: Payer: Self-pay | Admitting: Cardiovascular Disease

## 2014-11-23 VITALS — BP 128/90 | HR 80 | Ht 63.0 in | Wt 162.1 lb

## 2014-11-23 DIAGNOSIS — K2100 Gastro-esophageal reflux disease with esophagitis, without bleeding: Secondary | ICD-10-CM

## 2014-11-23 DIAGNOSIS — R011 Cardiac murmur, unspecified: Secondary | ICD-10-CM

## 2014-11-23 DIAGNOSIS — R0609 Other forms of dyspnea: Secondary | ICD-10-CM | POA: Diagnosis not present

## 2014-11-23 DIAGNOSIS — R0602 Shortness of breath: Secondary | ICD-10-CM

## 2014-11-23 DIAGNOSIS — I10 Essential (primary) hypertension: Secondary | ICD-10-CM

## 2014-11-23 DIAGNOSIS — R0789 Other chest pain: Secondary | ICD-10-CM | POA: Diagnosis not present

## 2014-11-23 DIAGNOSIS — R0600 Dyspnea, unspecified: Secondary | ICD-10-CM

## 2014-11-23 NOTE — Patient Instructions (Addendum)
Your physician has requested that you have an echocardiogram. Echocardiography is a painless test that uses sound waves to create images of your heart. It provides your doctor with information about the size and shape of your heart and how well your heart's chambers and valves are working. This procedure takes approximately one hour. There are no restrictions for this procedure.   Your physician has requested that you have an exercise tolerance test. For further information please visit [email or web address]. Please also follow instruction sheet, as given.   Your physician recommends that you schedule a follow-up appointment PENDING TESTS RESULTS.

## 2014-11-25 ENCOUNTER — Encounter: Payer: Self-pay | Admitting: Cardiovascular Disease

## 2014-11-25 DIAGNOSIS — R0602 Shortness of breath: Secondary | ICD-10-CM | POA: Insufficient documentation

## 2014-11-25 DIAGNOSIS — R0789 Other chest pain: Secondary | ICD-10-CM | POA: Insufficient documentation

## 2014-11-25 NOTE — Progress Notes (Signed)
Patient ID: Debra Trevino, female   DOB: October 19, 1955, 59 y.o.   MRN: [id number]     Primary MD: Dr. Colin Benton  Referring M.D.: Dr. Richmond Campbell  PATIENT PROFILE: Debra Trevino is a 59 y.o. female who is referred by Dr. Richmond Campbell for cardiology evaluation of chest pain.   HPI:  Debra Trevino has a history of hypertension for proximal 10 years but denies any other known cardiac disease.  The patient has had a history of dysphasia and has undergone esophageal dilatation for esophageal stricture by Debra Trevino.  Subsequent week, she had developed an episode of chest pressure which lasted all day, night in the next day, which he described as a pressure which was nonexertional.  She ultimately saw Debra Trevino back in follow-up and endoscopy did not show any recurrent stricture.  An ECG was done in Dr. Liliane Channel office which showed sinus bradycardia but otherwise no abnormality.  She admits to several  episodes of mild shortness of breath and occasional dizziness.  She is unaware of any palpitations or arrhythmia, but has noticed periods where heart rate speeds up more rapidly with activity.  She has seen Dr. Rita Ohara for low back pain and has experienced sciatica symptomatology and apparently has abnormality to her L4-5 disc. She has undergone 2 previous injections.  Presently, she denies any classic exertional chest pressure.  She denies any radiation of pain to jaw, neck or arms.  There is no syncope.  There is no significant family history for CAD.   Past Medical History  Diagnosis Date  . GERD (gastroesophageal reflux disease) 05/27/2014    Sees Debra Trevino; hx erosive esophagitis  . Depression 03/24/2007    Qualifier: Diagnosis of  By: Arnoldo Morale MD, Balinda Quails   . Hot flashes     recur when stops HRT  . Migraines     resolved on HRT for hot flases  . HTN (hypertension)   . Bilateral foot pain     morton neuroma, seeing podiatrist  . Muscle spasm     very active, muscle spasm after tennis  matches  . CTS (carpal tunnel syndrome)     has seen hand specilist  . Low back pain     chronic  . Gastric ulcer     per patient    Past Surgical History  Procedure Laterality Date  . Tonsillectomy      Allergies  Allergen Reactions  . Codeine   . Penicillins     Current Outpatient Prescriptions  Medication Sig Dispense Refill  . bisoprolol-hydrochlorothiazide (ZIAC) 2.5-6.25 MG per tablet TAKE ONE TABLET BY MOUTH ONE TIME DAILY 30 tablet 4  . citalopram (CELEXA) 20 MG tablet TAKE ONE TABLET BY MOUTH ONE TIME DAILY  90 tablet 1  . omeprazole (PRILOSEC) 40 MG capsule Take 40 mg by mouth daily.  3  . tizanidine (ZANAFLEX) 2 MG capsule Take 1 capsule (2 mg total) by mouth 2 (two) times daily as needed for muscle spasms. 30 capsule 0  . traMADol (ULTRAM) 50 MG tablet Take 50 mg by mouth every 12 (twelve) hours as needed (pain).   0   No current facility-administered medications for this visit.    Social History   Social History  . Marital Status: Married    Spouse Name: N/A  . Number of Children: N/A  . Years of Education: N/A   Occupational History  . Not on file.   Social History Main Topics  . Smoking status: Never  Smoker   . Smokeless tobacco: Never Used  . Alcohol Use: Yes     Comment: glass of wine each day  . Drug Use: No  . Sexual Activity: Not on file   Other Topics Concern  . Not on file   Social History Narrative   Work or School: 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      Home Situation: live with husband - boys off at college      Spiritual Beliefs: catholic      Lifestyle: lots of exercise - tennis; diet is healthy         Additional social history is that she is married for 21 years.  She has 2 children.  She completed 16 years of education.  There is no tobacco use.  She drinks occasional wine.  She plays tennis 3-4 times per week as not been as active due to her back discomfort and sciatica symptoms.  Family History  Problem  Relation Age of Onset  . Charcot-Marie-Tooth disease Father   . Renal cancer Mother   . Hypertension    . Schizophrenia Brother    Additional family history is notable that mother died of renal cell carcinoma.  Father had Charcot Lelan Pons tooth disorder.  One brother died at age 60 with congestive heart failure.  ROS General: Negative; No fevers, chills, or night sweats HEENT: Negative; No changes in vision or hearing, sinus congestion, difficulty swallowing Pulmonary: Negative; No cough, wheezing, shortness of breath, hemoptysis Cardiovascular:  See HPI; GI: History of esophageal stricture status post dilatation GU: Negative; No dysuria, hematuria, or difficulty voiding Musculoskeletal: Negative; no myalgias, joint pain, or weakness Hematologic/Oncologic: Negative; no easy bruising, bleeding Endocrine: Negative; no heat/cold intolerance; no diabetes Neuro: Negative; no changes in balance, headaches Skin: Negative; No rashes or skin lesions Psychiatric: Negative; No behavioral problems, depression Sleep: Negative; No daytime sleepiness, hypersomnolence, bruxism, restless legs, hypnogagnic hallucinations Other comprehensive 14 point system review is negative   Physical Exam BP 128/90 mmHg  Pulse 80  Ht _0  (1.6 m)  Wt 162 lb 1.6 oz (73.528 kg)  BMI 28.72 kg/m2  Wt Readings from Last 3 Encounters:  11/23/14 162 lb 1.6 oz (73.528 kg)  08/16/14 156 lb 4.8 oz (70.897 kg)  05/27/14 160 lb 11.2 oz (72.893 kg)   General: Alert, oriented, no distress.  Skin: normal turgor, no rashes, warm and dry HEENT: Normocephalic, atraumatic. Pupils equal round and reactive to light; sclera anicteric; extraocular muscles intact; Fundi without hemorrhages or exudates Nose without nasal septal hypertrophy Mouth/Parynx benign; Mallinpatti scale Neck: No JVD, no carotid bruits; normal carotid upstroke Lungs: clear to ausculatation and percussion; no wheezing or rales Chest wall: without tenderness  to palpitation Heart: PMI not displaced, RRR, s1 s2 normal, faint 1/6 systolic murmur, no diastolic murmur, no rubs, gallops, thrills, or heaves Abdomen: soft, nontender; no hepatosplenomehaly, BS+; abdominal aorta nontender and not dilated by palpation. Back: no CVA tenderness Pulses 2+ Musculoskeletal: full range of motion, normal strength, no joint deformities Extremities: no clubbing cyanosis or edema, Homan's sign negative  Neurologic: grossly nonfocal; Cranial nerves grossly wnl Psychologic: Normal mood and affect   ECG (independently read by me): Normal sinus rhythm at 80 bpm.  Small nondiagnostic Q waves II, III, and F.  LABS:  BMP Latest Ref Rng 05/27/2014 11/27/2012 09/02/2006  Glucose 70 - 99 mg/dL 103(H) 82 96  BUN 6 - 23 mg/dL _1 Creatinine 0.40 - 1.20 mg/dL 0.89 0.7  0.8  Sodium 135 - 145 mEq/L 139 138 143  Potassium 3.5 - 5.1 mEq/L 4.6 4.3 4.1  Chloride 96 - 112 mEq/L 105 107 113(H)  CO2 19 - 32 mEq/L _0 Calcium 8.4 - 10.5 mg/dL 9.6 8.8 8.8     Hepatic Function Latest Ref Rng 11/27/2012 09/02/2006  Total Protein 6.0 - 8.3 g/dL 6.3 6.9  Albumin 3.5 - 5.2 g/dL 3.5 3.6  AST 0 - 37 U/L 18 18  ALT 0 - 35 U/L 19 16  Alk Phosphatase 39 - 117 U/L 59 85  Total Bilirubin 0.3 - 1.2 mg/dL 0.2(L) 0.5  Bilirubin, Direct 0.0 - 0.3 mg/dL 0.0 0.1    CBC Latest Ref Rng 05/27/2014 11/27/2012 09/02/2006  WBC 4.0 - 10.5 K/uL 7.6 7.6 6.1  Hemoglobin 12.0 - 15.0 g/dL 14.4 12.7 13.6  Hematocrit 36.0 - 46.0 % 41.5 37.2 38.5  Platelets 150.0 - 400.0 K/uL 262.0 235.0 275   Lab Results  Component Value Date   MCV 90.7 05/27/2014   MCV 90.8 11/27/2012   MCV 87.5 09/02/2006   Lab Results  Component Value Date   TSH 1.09 05/27/2014   Lab Results  Component Value Date   HGBA1C 5.5 08/16/2014     BNP No results found for: BNP  ProBNP No results found for: PROBNP   Lipid Panel     Component Value Date/Time   CHOL 172 08/16/2014 1004   TRIG 149.0 08/16/2014 1004    HDL 56.20 08/16/2014 1004   CHOLHDL 3 08/16/2014 1004   VLDL 29.8 08/16/2014 1004   LDLCALC 86 08/16/2014 1004    RADIOLOGY: No results found.   ASSESSMENT AND PLAN: Ms. Azarria Balint is a 59 year old Caucasian female who has a 10 year history of hypertension for which she has been taking bisoprolol HCT 2.5/6.25 mg daily.  Her blood pressure today is controlled.  Her resting pulse is 80.  She is status post recent esophageal dilatation for esophageal stricture.  She has noticed episodes of an episode of chest pressure without radiation and subsequent endoscopy has not disclosed any recurrent stricture.  She denies difficulty with swallowing.  She denies exertional chest pain symptomatology.  She has noticed some mild exertional dyspnea.  She has a faint systolic murmur.  I'm scheduling her for 2-D echo Doppler study to evaluate both systolic and diastolic function as well as valvular architecture.  She is active and plays tennis but has not been active recently due to her back discomfort.  Her chest pain has atypical features for ischemic chest pain.  I question whether or not there may have been a component of esophageal spasm contributed to her symptomatology.  I will schedule her for a routine graded exercise treadmill test for risk stratification of her somewhat atypical chest pain.  I will see her in the office in follow-up and further recommendations will be made at that time.   Troy Sine, MD, [facility] 11/25/2014 6:04 PM

## 2014-11-29 ENCOUNTER — Ambulatory Visit (HOSPITAL_COMMUNITY): Payer: BLUE CROSS/BLUE SHIELD | Attending: Cardiology

## 2014-11-29 ENCOUNTER — Other Ambulatory Visit: Payer: Self-pay

## 2014-11-29 ENCOUNTER — Other Ambulatory Visit: Payer: Self-pay | Admitting: Family Medicine

## 2014-11-29 DIAGNOSIS — R011 Cardiac murmur, unspecified: Secondary | ICD-10-CM | POA: Insufficient documentation

## 2014-11-29 DIAGNOSIS — I10 Essential (primary) hypertension: Secondary | ICD-10-CM | POA: Diagnosis not present

## 2014-11-29 DIAGNOSIS — R0609 Other forms of dyspnea: Secondary | ICD-10-CM

## 2014-11-29 DIAGNOSIS — R0600 Dyspnea, unspecified: Secondary | ICD-10-CM

## 2014-11-29 DIAGNOSIS — I071 Rheumatic tricuspid insufficiency: Secondary | ICD-10-CM | POA: Insufficient documentation

## 2014-11-30 ENCOUNTER — Encounter (HOSPITAL_COMMUNITY): Payer: BLUE CROSS/BLUE SHIELD

## 2014-12-14 ENCOUNTER — Ambulatory Visit: Payer: BLUE CROSS/BLUE SHIELD | Admitting: Cardiovascular Disease

## 2014-12-21 ENCOUNTER — Inpatient Hospital Stay (HOSPITAL_COMMUNITY): Admission: RE | Admit: 2014-12-21 | Payer: BLUE CROSS/BLUE SHIELD | Source: Ambulatory Visit

## 2015-03-02 ENCOUNTER — Telehealth: Payer: Self-pay | Admitting: Family Medicine

## 2015-03-02 NOTE — Telephone Encounter (Signed)
Pt son sees dr Elease Hashimoto and pt would like to switch to dr burchette. Can I sch?

## 2015-03-02 NOTE — Telephone Encounter (Signed)
[place] with me if ok with Bruce - have only seen her a couple of times. Though, I think Bruce is quite full.

## 2015-03-11 NOTE — Telephone Encounter (Signed)
Pt is aware.  

## 2015-03-11 NOTE — Telephone Encounter (Signed)
lmom for pt to call back

## 2015-03-13 HISTORY — PX: MEDIAL PARTIAL KNEE REPLACEMENT: SHX5965

## 2015-04-07 ENCOUNTER — Other Ambulatory Visit: Payer: Self-pay | Admitting: Family Medicine

## 2015-04-12 ENCOUNTER — Other Ambulatory Visit: Payer: Self-pay | Admitting: Family Medicine

## 2015-05-12 ENCOUNTER — Other Ambulatory Visit: Payer: Self-pay | Admitting: Family Medicine

## 2015-05-29 ENCOUNTER — Other Ambulatory Visit: Payer: Self-pay | Admitting: Family Medicine

## 2015-07-08 DIAGNOSIS — M25561 Pain in right knee: Secondary | ICD-10-CM | POA: Diagnosis not present

## 2015-07-08 DIAGNOSIS — G8929 Other chronic pain: Secondary | ICD-10-CM | POA: Diagnosis not present

## 2015-08-05 DIAGNOSIS — M1711 Unilateral primary osteoarthritis, right knee: Secondary | ICD-10-CM | POA: Diagnosis not present

## 2015-08-31 ENCOUNTER — Other Ambulatory Visit: Payer: Self-pay | Admitting: Family Medicine

## 2015-09-01 ENCOUNTER — Other Ambulatory Visit: Payer: Self-pay | Admitting: Orthopedic Surgery

## 2015-09-01 DIAGNOSIS — M1711 Unilateral primary osteoarthritis, right knee: Secondary | ICD-10-CM

## 2015-09-11 ENCOUNTER — Other Ambulatory Visit: Payer: Self-pay | Admitting: Family Medicine

## 2015-10-06 ENCOUNTER — Ambulatory Visit: Payer: BLUE CROSS/BLUE SHIELD

## 2015-10-13 ENCOUNTER — Ambulatory Visit
Admission: RE | Admit: 2015-10-13 | Discharge: 2015-10-13 | Disposition: A | Discharge status: Home / Self Care | Payer: BLUE CROSS/BLUE SHIELD | Source: Ambulatory Visit | Attending: Orthopedic Surgery | Admitting: Orthopedic Surgery

## 2015-10-13 DIAGNOSIS — M25461 Effusion, right knee: Secondary | ICD-10-CM | POA: Diagnosis not present

## 2015-10-13 DIAGNOSIS — M1711 Unilateral primary osteoarthritis, right knee: Secondary | ICD-10-CM

## 2015-10-13 DIAGNOSIS — M179 Osteoarthritis of knee, unspecified: Secondary | ICD-10-CM | POA: Diagnosis not present

## 2015-10-24 DIAGNOSIS — L814 Other melanin hyperpigmentation: Secondary | ICD-10-CM | POA: Diagnosis not present

## 2015-10-24 DIAGNOSIS — L821 Other seborrheic keratosis: Secondary | ICD-10-CM | POA: Diagnosis not present

## 2015-10-24 DIAGNOSIS — D045 Carcinoma in situ of skin of trunk: Secondary | ICD-10-CM | POA: Diagnosis not present

## 2015-10-24 DIAGNOSIS — L820 Inflamed seborrheic keratosis: Secondary | ICD-10-CM | POA: Diagnosis not present

## 2015-10-24 DIAGNOSIS — Z85828 Personal history of other malignant neoplasm of skin: Secondary | ICD-10-CM | POA: Diagnosis not present

## 2015-11-07 DIAGNOSIS — Z789 Other specified health status: Secondary | ICD-10-CM | POA: Diagnosis not present

## 2015-11-07 DIAGNOSIS — M1711 Unilateral primary osteoarthritis, right knee: Secondary | ICD-10-CM | POA: Diagnosis not present

## 2015-11-07 DIAGNOSIS — F339 Major depressive disorder, recurrent, unspecified: Secondary | ICD-10-CM | POA: Diagnosis not present

## 2015-11-07 DIAGNOSIS — Z23 Encounter for immunization: Secondary | ICD-10-CM | POA: Diagnosis not present

## 2015-11-07 DIAGNOSIS — I10 Essential (primary) hypertension: Secondary | ICD-10-CM | POA: Diagnosis not present

## 2015-11-16 DIAGNOSIS — D045 Carcinoma in situ of skin of trunk: Secondary | ICD-10-CM | POA: Diagnosis not present

## 2015-11-21 DIAGNOSIS — Z1231 Encounter for screening mammogram for malignant neoplasm of breast: Secondary | ICD-10-CM | POA: Diagnosis not present

## 2015-11-30 DIAGNOSIS — M1711 Unilateral primary osteoarthritis, right knee: Secondary | ICD-10-CM | POA: Diagnosis not present

## 2015-12-06 DIAGNOSIS — Z96651 Presence of right artificial knee joint: Secondary | ICD-10-CM | POA: Diagnosis not present

## 2015-12-06 DIAGNOSIS — M1711 Unilateral primary osteoarthritis, right knee: Secondary | ICD-10-CM | POA: Diagnosis not present

## 2015-12-08 DIAGNOSIS — M25661 Stiffness of right knee, not elsewhere classified: Secondary | ICD-10-CM | POA: Diagnosis not present

## 2015-12-12 DIAGNOSIS — M25661 Stiffness of right knee, not elsewhere classified: Secondary | ICD-10-CM | POA: Diagnosis not present

## 2015-12-15 DIAGNOSIS — M25661 Stiffness of right knee, not elsewhere classified: Secondary | ICD-10-CM | POA: Diagnosis not present

## 2015-12-19 DIAGNOSIS — M25661 Stiffness of right knee, not elsewhere classified: Secondary | ICD-10-CM | POA: Diagnosis not present

## 2015-12-22 DIAGNOSIS — M25661 Stiffness of right knee, not elsewhere classified: Secondary | ICD-10-CM | POA: Diagnosis not present

## 2015-12-26 DIAGNOSIS — M25661 Stiffness of right knee, not elsewhere classified: Secondary | ICD-10-CM | POA: Diagnosis not present

## 2015-12-30 DIAGNOSIS — M25661 Stiffness of right knee, not elsewhere classified: Secondary | ICD-10-CM | POA: Diagnosis not present

## 2016-01-02 ENCOUNTER — Emergency Department (HOSPITAL_COMMUNITY)
Admission: EM | Admit: 2016-01-02 | Discharge: 2016-01-02 | Disposition: A | Discharge status: Home / Self Care | Payer: BLUE CROSS/BLUE SHIELD | Attending: Emergency Medicine | Admitting: Emergency Medicine

## 2016-01-02 ENCOUNTER — Emergency Department (HOSPITAL_COMMUNITY): Payer: BLUE CROSS/BLUE SHIELD

## 2016-01-02 ENCOUNTER — Emergency Department (HOSPITAL_BASED_OUTPATIENT_CLINIC_OR_DEPARTMENT_OTHER)
Admit: 2016-01-02 | Discharge: 2016-01-02 | Disposition: A | Discharge status: Home / Self Care | Payer: BLUE CROSS/BLUE SHIELD | Attending: Emergency Medicine | Admitting: Emergency Medicine

## 2016-01-02 ENCOUNTER — Encounter (HOSPITAL_COMMUNITY): Payer: Self-pay

## 2016-01-02 DIAGNOSIS — R55 Syncope and collapse: Secondary | ICD-10-CM | POA: Diagnosis not present

## 2016-01-02 DIAGNOSIS — M79609 Pain in unspecified limb: Secondary | ICD-10-CM | POA: Diagnosis not present

## 2016-01-02 DIAGNOSIS — I10 Essential (primary) hypertension: Secondary | ICD-10-CM | POA: Insufficient documentation

## 2016-01-02 DIAGNOSIS — M7989 Other specified soft tissue disorders: Secondary | ICD-10-CM | POA: Diagnosis not present

## 2016-01-02 DIAGNOSIS — T819XXA Unspecified complication of procedure, initial encounter: Secondary | ICD-10-CM | POA: Diagnosis not present

## 2016-01-02 LAB — BASIC METABOLIC PANEL
Anion gap: 8 (ref 5–15)
BUN: 14 mg/dL (ref 6–20)
CHLORIDE: 106 mmol/L (ref 101–111)
CO2: 26 mmol/L (ref 22–32)
CREATININE: 0.7 mg/dL (ref 0.44–1.00)
Calcium: 9.8 mg/dL (ref 8.9–10.3)
GFR calc Af Amer: >60 mL/min (ref >60)
GFR calc non Af Amer: >60 mL/min (ref >60)
GLUCOSE: 85 mg/dL (ref 65–99)
POTASSIUM: 4.9 mmol/L (ref 3.5–5.1)
SODIUM: 140 mmol/L (ref 135–145)

## 2016-01-02 LAB — CBC
HEMATOCRIT: 41.2 % (ref 36.0–46.0)
Hemoglobin: 13.8 g/dL (ref 12.0–15.0)
MCH: 29.9 pg (ref 26.0–34.0)
MCHC: 33.5 g/dL (ref 30.0–36.0)
MCV: 89.4 fL (ref 78.0–100.0)
PLATELETS: 297 10*3/uL (ref 150–400)
RBC: 4.61 MIL/uL (ref 3.87–5.11)
RDW: 12.6 % (ref 11.5–15.5)
WBC: 8.5 10*3/uL (ref 4.0–10.5)

## 2016-01-02 LAB — D-DIMER, QUANTITATIVE: D-Dimer, Quant: 2.5 ug/mL-FEU — ABNORMAL HIGH (ref 0.00–0.50)

## 2016-01-02 MED ORDER — IOPAMIDOL (ISOVUE-370) INJECTION 76%
INTRAVENOUS | Status: AC
  Administered 2016-01-02: 66 mL
  Filled 2016-01-02: qty 100

## 2016-01-02 NOTE — ED Provider Notes (Signed)
[facility] DEPT Provider Note   CSN: [id number] Arrival date & time: 01/02/16  1507     History   Chief Complaint Chief Complaint  Patient presents with  . Near Syncope  . Leg Swelling    HPI TASHARI WOODKE is a 60 y.o. female.  Patient presents with syncopal episode this morning while standing and brushing her teeth. Denies preceding symptoms including chest pain, dyspnea, palpitations. She is asymptomatic now. She does not two days of generalized weakness/fatigue. She was seen by a PA at her PCP's office who found an elevated d-dimer and sent here here for evaluation of PE/DVT as she had knee surgery about a month ago. She does not typically get syncopal episodes and did note that she hadn't eaten breakfast that day but otherwise reports nothing abnormal about the morning.   The history is provided by the patient and medical records.  Loss of Consciousness   This is a new problem. The current episode started 6 to 12 hours ago. The problem occurs rarely. The problem has been resolved. She lost consciousness for a period of less than one minute. The problem is associated with normal activity. Associated symptoms include light-headedness. Pertinent negatives include abdominal pain, chest pain, diaphoresis, fever, focal weakness, nausea, visual change and vomiting. She has tried nothing for the symptoms. Her past medical history does not include CAD, DM, HTN or seizures.    Past Medical History:  Diagnosis Date  . Bilateral foot pain    morton neuroma, seeing podiatrist  . CTS (carpal tunnel syndrome)    has seen hand specilist  . Depression 03/24/2007   Qualifier: Diagnosis of  By: Arnoldo Morale MD, Balinda Quails   . Gastric ulcer    per patient  . GERD (gastroesophageal reflux disease) 05/27/2014   Sees Dr. Earlean Shawl; hx erosive esophagitis  . Hot flashes    recur when stops HRT  . HTN (hypertension)   . Low back pain    chronic  . Migraines    resolved on HRT for hot flases  .  Muscle spasm    very active, muscle spasm after tennis matches    Patient Active Problem List   Diagnosis Date Noted  . Atypical chest pain 11/25/2014  . Exertional shortness of breath 11/25/2014  . GERD (gastroesophageal reflux disease) 05/27/2014  . Essential hypertension 05/20/2007  . Depression 03/24/2007  . SYMPTOMATIC MENOPAUSAL/FEMALE CLIMACTERIC STATES 03/24/2007  . ALLERGIC RHINITIS 09/30/2006    Past Surgical History:  Procedure Laterality Date  . TONSILLECTOMY      OB History    No data available       Home Medications    Prior to Admission medications   Medication Sig Start Date End Date Taking? Authorizing Provider  bisoprolol-hydrochlorothiazide [facility]) 2.5-6.25 MG tablet TAKE ONE TABLET BY MOUTH ONE TIME DAILY 04/13/15  Yes Lucretia Kern, DO  citalopram (CELEXA) 20 MG tablet TAKE ONE TABLET BY MOUTH ONE TIME DAILY 05/30/15  Yes Lucretia Kern, DO  omeprazole (PRILOSEC) 40 MG capsule Take 40 mg by mouth daily. 10/29/14  Yes Historical Provider, MD  tizanidine (ZANAFLEX) 2 MG capsule Take 1 capsule (2 mg total) by mouth 2 (two) times daily as needed for muscle spasms. 05/27/14  Yes Lucretia Kern, DO  traMADol (ULTRAM) 50 MG tablet Take 50 mg by mouth every 12 (twelve) hours as needed (pain).  08/07/14  Yes Historical Provider, MD  bisoprolol-hydrochlorothiazide (ZIAC) 2.5-6.25 MG tablet TAKE ONE TABLET BY MOUTH ONE TIME DAILY Patient  not taking: Reported on 01/02/2016 09/12/15   Lucretia Kern, DO    Family History Family History  Problem Relation Age of Onset  . Charcot-Marie-Tooth disease Father   . Renal cancer Mother   . Schizophrenia Brother   . Hypertension      Social History Social History  Substance Use Topics  . Smoking status: Never Smoker  . Smokeless tobacco: Never Used  . Alcohol use Yes     Comment: glass of wine each day     Allergies   Codeine and Penicillins   Review of Systems Review of Systems  Constitutional: Negative for diaphoresis  and fever.  HENT: Negative.   Respiratory: Negative for cough and shortness of breath.   Cardiovascular: Positive for leg swelling and syncope. Negative for chest pain.  Gastrointestinal: Negative for abdominal pain, nausea and vomiting.  Genitourinary: Negative.   Musculoskeletal: Negative.   Skin: Negative.   Allergic/Immunologic: Negative for immunocompromised state.  Neurological: Positive for light-headedness. Negative for focal weakness.  Hematological: Does not bruise/bleed easily.  Psychiatric/Behavioral: Negative.      Physical Exam Updated Vital Signs BP 108/59   Pulse 70   Temp 97.5 F (36.4 C) (Oral)   Resp 16   Ht 5\' 3"  (1.6 m)   Wt 72.6 kg   SpO2 96%   BMI 28.34 kg/m   Physical Exam  Constitutional: She is oriented to person, place, and time. She appears well-developed and well-nourished. No distress.  HENT:  Head: Normocephalic and atraumatic.  Eyes: Conjunctivae and EOM are normal. No scleral icterus.  Neck: Normal range of motion. Neck supple.  Cardiovascular: Normal rate, regular rhythm, normal heart sounds and intact distal pulses.  Exam reveals no gallop and no friction rub.   No murmur heard. Pulmonary/Chest: Effort normal and breath sounds normal. No respiratory distress. She has no wheezes. She has no rales.  Abdominal: Soft. She exhibits no distension and no mass. There is no tenderness. There is no rebound and no guarding.  Musculoskeletal: Normal range of motion.  R knee with well-healing incision, mild edema but no warmth or tenderness to touch. No calf tenderness. 2+ DP pulse.  Neurological: She is alert and oriented to person, place, and time.  Skin: Skin is warm and dry. Capillary refill takes less than 2 seconds. No rash noted. She is not diaphoretic. No pallor.  Psychiatric: She has a normal mood and affect. Her behavior is normal. Judgment and thought content normal.     ED Treatments / Results  Labs (all labs ordered are listed, but  only abnormal results are displayed) Labs Reviewed  D-DIMER, QUANTITATIVE (NOT AT [facility]) - Abnormal; Notable for the following:       Result Value   D-Dimer, Quant 2.50 (*)    All other components within normal limits  BASIC METABOLIC PANEL  CBC  URINALYSIS, ROUTINE W REFLEX MICROSCOPIC (NOT AT [facility])  CBG MONITORING, ED    EKG  EKG Interpretation None       Radiology Ct Angio Chest Pe W And/or Wo Contrast  Result Date: 01/02/2016 CLINICAL DATA:  Recent knee surgery, history of fall with syncopal episode EXAM: CT ANGIOGRAPHY CHEST WITH CONTRAST TECHNIQUE: Multidetector CT imaging of the chest was performed using the standard protocol during bolus administration of intravenous contrast. Multiplanar CT image reconstructions and MIPs were obtained to evaluate the vascular anatomy. CONTRAST:  66 mL Isovue 370 intravenous COMPARISON:  None. FINDINGS: Cardiovascular: There is no evidence for filling defect within the central  or segmental pulmonary arteries to suggest the presence of acute pulmonary embolus. Thoracic aorta is non aneurysmal. No dissection is seen. There is borderline to mild cardiomegaly. No pericardial effusion. Mediastinum/Nodes: No significantly enlarged mediastinal or hilar lymph nodes. No axillary adenopathy. Trachea and mainstem bronchi are within normal limits. Small distal esophageal hiatal hernia. Lungs/Pleura: Patchy dependent atelectasis within the posterior lower lobes. No acute infiltrate or effusion. No consolidation. Upper Abdomen: No acute abnormality. Musculoskeletal: No chest wall abnormality. No acute or significant osseous findings. Review of the MIP images confirms the above findings. IMPRESSION: 1. No CT evidence for acute pulmonary embolus.  No dissection. 2. No acute infiltrate, effusion, or consolidation. 3. Borderline to mild cardiomegaly. Electronically Signed   By: Donavan Foil M.D.   On: 01/02/2016 21:01    Procedures Procedures (including critical  care time)  Medications Ordered in ED Medications  iopamidol (ISOVUE-370) 76 % injection (66 mLs  Contrast Given 01/02/16 2018)     Initial Impression / Assessment and Plan / ED Course  I have reviewed the triage vital signs and the nursing notes.  Pertinent labs & imaging results that were available during my care of the patient were reviewed by me and considered in my medical decision making (see chart for details).  Clinical Course    Patient presents referred by PCP's office for evaluation of PE/DVT after an unexplained near-syncopal episode this morning. She had knee surgery a month ago for a partial knee replacement on the right side and has had swelling since then. She had an elevated D-dimer at her PCP's office. She denies chest pain or dyspnea. She is well-appearing and ambulatory on presentation, with normal vital signs and no hypoxia. Normal work of breathing. EKG unremarkable without signs of ischemic change or arrhythmia. LE duplex ultrasound negative for DVT. CTA PE study was negative for pulmonary embolism. Unclear etiology of pre-syncopal episode though it is unlikely to be cardiac given no preceding symptoms. May have been due to not eating breakfast or standing for too long. She was given return precautions for worsening symptoms and expressed understanding. She is in good condition for discharge home.  Final Clinical Impressions(s) / ED Diagnoses   Final diagnoses:  Near syncope    New Prescriptions Discharge Medication List as of 01/02/2016  9:22 PM       Harlin Heys, MD 01/02/16 2313    Veryl Speak, MD 01/02/16 [phone]

## 2016-01-02 NOTE — ED Triage Notes (Signed)
Pt. Was sent to [place] due to an elevated D-dimer. Pt. Had a partial rt. Knee replacement 4 weeks ago.   Pt. Had a near syncopal episode today and went to see her MD [place] and was sent to [place].  Pt. Denies any any chest pain or sob.  Skin is warm,pink and dry.    Rt. Knee and leg is swollen and discolored,. Warm to touch. Alert and oriented X4.

## 2016-01-02 NOTE — Progress Notes (Signed)
**  Preliminary report by tech**'  Right lower extremity venous duplex completed. There is no evidence of deep or superficial vein thrombosis involving the right lower extremity. All visualized vessels appear patent and compressible. There is no evidence of a Baker's cyst on the right.  01/02/16 6:12 PM Debra Trevino RVT

## 2016-01-02 NOTE — ED Notes (Signed)
Pt transported to CT ?

## 2016-01-05 DIAGNOSIS — M25661 Stiffness of right knee, not elsewhere classified: Secondary | ICD-10-CM | POA: Diagnosis not present

## 2016-01-09 DIAGNOSIS — M25661 Stiffness of right knee, not elsewhere classified: Secondary | ICD-10-CM | POA: Diagnosis not present

## 2016-01-12 DIAGNOSIS — M25661 Stiffness of right knee, not elsewhere classified: Secondary | ICD-10-CM | POA: Diagnosis not present

## 2016-01-17 ENCOUNTER — Other Ambulatory Visit: Payer: Self-pay | Admitting: Family Medicine

## 2016-02-26 ENCOUNTER — Other Ambulatory Visit: Payer: Self-pay | Admitting: Family Medicine

## 2016-04-05 ENCOUNTER — Other Ambulatory Visit: Payer: Self-pay | Admitting: Family Medicine

## 2016-04-30 ENCOUNTER — Other Ambulatory Visit: Payer: Self-pay | Admitting: Family Medicine

## 2016-05-15 DIAGNOSIS — F339 Major depressive disorder, recurrent, unspecified: Secondary | ICD-10-CM | POA: Diagnosis not present

## 2016-05-15 DIAGNOSIS — K130 Diseases of lips: Secondary | ICD-10-CM | POA: Diagnosis not present

## 2016-05-15 DIAGNOSIS — I10 Essential (primary) hypertension: Secondary | ICD-10-CM | POA: Diagnosis not present

## 2016-05-15 DIAGNOSIS — F1010 Alcohol abuse, uncomplicated: Secondary | ICD-10-CM | POA: Diagnosis not present

## 2016-05-23 DIAGNOSIS — K130 Diseases of lips: Secondary | ICD-10-CM | POA: Diagnosis not present

## 2016-05-28 DIAGNOSIS — M25562 Pain in left knee: Secondary | ICD-10-CM | POA: Diagnosis not present

## 2016-06-25 ENCOUNTER — Other Ambulatory Visit: Payer: Self-pay | Admitting: Physician Assistant

## 2016-06-25 DIAGNOSIS — M25562 Pain in left knee: Secondary | ICD-10-CM

## 2016-06-27 ENCOUNTER — Ambulatory Visit
Admission: RE | Admit: 2016-06-27 | Discharge: 2016-06-27 | Disposition: A | Discharge status: Home / Self Care | Payer: BLUE CROSS/BLUE SHIELD | Source: Ambulatory Visit | Attending: Physician Assistant | Admitting: Physician Assistant

## 2016-06-27 DIAGNOSIS — M25562 Pain in left knee: Secondary | ICD-10-CM

## 2016-06-27 DIAGNOSIS — S83241A Other tear of medial meniscus, current injury, right knee, initial encounter: Secondary | ICD-10-CM | POA: Diagnosis not present

## 2016-07-27 DIAGNOSIS — M1712 Unilateral primary osteoarthritis, left knee: Secondary | ICD-10-CM | POA: Diagnosis not present

## 2016-08-15 DIAGNOSIS — M25571 Pain in right ankle and joints of right foot: Secondary | ICD-10-CM | POA: Diagnosis not present

## 2016-09-20 DIAGNOSIS — M1712 Unilateral primary osteoarthritis, left knee: Secondary | ICD-10-CM | POA: Diagnosis not present

## 2016-10-10 DIAGNOSIS — M1712 Unilateral primary osteoarthritis, left knee: Secondary | ICD-10-CM | POA: Diagnosis not present

## 2016-10-17 DIAGNOSIS — M1712 Unilateral primary osteoarthritis, left knee: Secondary | ICD-10-CM | POA: Diagnosis not present

## 2016-10-24 DIAGNOSIS — M1712 Unilateral primary osteoarthritis, left knee: Secondary | ICD-10-CM | POA: Diagnosis not present

## 2016-11-29 ENCOUNTER — Encounter: Payer: Self-pay | Admitting: Family Medicine

## 2016-12-12 DIAGNOSIS — Z7189 Other specified counseling: Secondary | ICD-10-CM | POA: Diagnosis not present

## 2016-12-12 DIAGNOSIS — Z23 Encounter for immunization: Secondary | ICD-10-CM | POA: Diagnosis not present

## 2017-02-06 ENCOUNTER — Telehealth: Payer: Self-pay | Admitting: Family Medicine

## 2017-02-06 NOTE — Telephone Encounter (Signed)
[place] with me. Looks like she saw bruce before and saw me twice a long time ago.

## 2017-02-06 NOTE — Telephone Encounter (Signed)
Ok with me 

## 2017-02-06 NOTE — Telephone Encounter (Signed)
Copied from [place]. Topic: Inquiry >> Feb 06, 2017 10:30 AM Neva Seat wrote: Pt is scheduled to see Dr. Elease Hashimoto for a new pt visit on Dec. 27.  Her son Deserie Dirks is a pt of Dr. Elease Hashimoto. She was given Dr. Maudie Mercury when her dr retired.  Hhowever she feels she doesn't feel comfortable with Dr. Maudie Mercury.

## 2017-02-07 DIAGNOSIS — R1310 Dysphagia, unspecified: Secondary | ICD-10-CM | POA: Diagnosis not present

## 2017-02-07 DIAGNOSIS — K222 Esophageal obstruction: Secondary | ICD-10-CM | POA: Diagnosis not present

## 2017-02-07 DIAGNOSIS — K449 Diaphragmatic hernia without obstruction or gangrene: Secondary | ICD-10-CM | POA: Diagnosis not present

## 2017-02-07 NOTE — Telephone Encounter (Signed)
Noted  

## 2017-02-11 ENCOUNTER — Encounter: Payer: Self-pay | Admitting: Family Medicine

## 2017-03-07 ENCOUNTER — Ambulatory Visit: Payer: BLUE CROSS/BLUE SHIELD | Admitting: Family Medicine

## 2017-03-07 ENCOUNTER — Encounter: Payer: Self-pay | Admitting: Family Medicine

## 2017-03-07 VITALS — BP 112/62 | HR 76 | Temp 97.6°F | Ht 63.0 in | Wt 162.2 lb

## 2017-03-07 DIAGNOSIS — I10 Essential (primary) hypertension: Secondary | ICD-10-CM | POA: Diagnosis not present

## 2017-03-07 DIAGNOSIS — M1710 Unilateral primary osteoarthritis, unspecified knee: Secondary | ICD-10-CM

## 2017-03-07 DIAGNOSIS — M179 Osteoarthritis of knee, unspecified: Secondary | ICD-10-CM

## 2017-03-07 DIAGNOSIS — F329 Major depressive disorder, single episode, unspecified: Secondary | ICD-10-CM

## 2017-03-07 DIAGNOSIS — Z8249 Family history of ischemic heart disease and other diseases of the circulatory system: Secondary | ICD-10-CM

## 2017-03-07 DIAGNOSIS — K2100 Gastro-esophageal reflux disease with esophagitis, without bleeding: Secondary | ICD-10-CM

## 2017-03-07 DIAGNOSIS — F32A Depression, unspecified: Secondary | ICD-10-CM

## 2017-03-07 MED ORDER — BISOPROLOL-HYDROCHLOROTHIAZIDE 2.5-6.25 MG PO TABS: 1.0000 | ORAL_TABLET | Freq: Every day | ORAL | 3 refills | Status: DC

## 2017-03-07 NOTE — Progress Notes (Signed)
Subjective:     Patient ID: Debra Trevino, female   DOB: [birth year], 61 y.o.   MRN: [id number]  HPI Patient seen to reestablish care. She had been seen here at this practice for years and recently had been seen by another physician (outside practice) and is now reestablishing. Chronic problems include history of hypertension, GERD, history of esophageal stricture, history of depression, and allergic rhinitis. She had recent EGD with dilatation of esophagus and is doing well at this time on omeprazole. She takes Ziac 2.5/6.25 milligrams once daily for blood pressure and this has been well controlled. She is on Celexa for depression issues  Recently noticed some right ear fullness. No pain. No dizziness.  Frequent nasal congestion.  She's had 2 brothers that died of MI at age 65. Patient had echocardiogram couple years ago which was unremarkable. She was scheduled for stress test but never went. She's not had any exertional dyspnea or chest pains. No history of diabetes. Nonsmoker.  Past Medical History:  Diagnosis Date  . Bilateral foot pain    morton neuroma, seeing podiatrist  . CTS (carpal tunnel syndrome)    has seen hand specilist  . Depression 03/24/2007   Qualifier: Diagnosis of  By: Arnoldo Morale MD, Balinda Quails   . Gastric ulcer    per patient  . GERD (gastroesophageal reflux disease) 05/27/2014   Sees Dr. Earlean Shawl; hx erosive esophagitis  . Hot flashes    recur when stops HRT  . HTN (hypertension)   . Low back pain    chronic  . Migraines    resolved on HRT for hot flases  . Muscle spasm    very active, muscle spasm after tennis matches   Past Surgical History:  Procedure Laterality Date  . TONSILLECTOMY      reports that  has never smoked. she has never used smokeless tobacco. She reports that she drinks alcohol. She reports that she does not use drugs. family history includes Charcot-Marie-Tooth disease in her father; Hypertension in her unknown relative; Renal cancer in her mother;  Schizophrenia in her brother. Allergies  Allergen Reactions  . Codeine     unknown  . Penicillins Swelling    Has patient had a PCN reaction causing immediate rash, facial/tongue/throat swelling, SOB or lightheadedness with hypotension:YES Has patient had a PCN reaction causing severe rash involving mucus membranes or skin necrosis: NO Has patient had a PCN reaction that required hospitalization NO Has patient had a PCN reaction occurring within the last 10 years: NO If all of the above answers are "NO", then may proceed with Cephalosporin use.     Review of Systems  Constitutional: Negative for fatigue.  Eyes: Negative for visual disturbance.  Respiratory: Negative for cough, chest tightness, shortness of breath and wheezing.   Cardiovascular: Negative for chest pain, palpitations and leg swelling.  Neurological: Negative for dizziness, seizures, syncope, weakness, light-headedness and headaches.       Objective:   Physical Exam  Constitutional: She appears well-developed and well-nourished.  Eyes: Pupils are equal, round, and reactive to light.  Neck: Neck supple. No JVD present. No thyromegaly present.  Cardiovascular: Normal rate and regular rhythm. Exam reveals no gallop.  Pulmonary/Chest: Effort normal and breath sounds normal. No respiratory distress. She has no wheezes. She has no rales.  Musculoskeletal: She exhibits no edema.  Neurological: She is alert.       Assessment:     #1 hypertension stable and at goal  #2 history of GERD with esophageal  stricture with recent EGD and dilatation  #3 history of depression  #4 osteoarthritis with history of right partial knee replacement  #5 positive family history of CAD.    Plan:     -Refill Ziac for one year -Discussed potential further screening for CAD with positive family history as above. Consider stress testing and she will let us know if interested.  She is undecided at this point. -Consider new shingles  vaccine and check on insurance coverage  Eulas Post MD [facility] at [facility]

## 2017-03-08 ENCOUNTER — Other Ambulatory Visit: Payer: Self-pay | Admitting: Family Medicine

## 2017-03-09 NOTE — Telephone Encounter (Signed)
Refill for one year 

## 2017-03-11 MED ORDER — CITALOPRAM HYDROBROMIDE 20 MG PO TABS: 20.0000 mg | ORAL_TABLET | Freq: Every day | ORAL | 3 refills | Status: DC

## 2017-03-11 NOTE — Telephone Encounter (Signed)
Rx done. 

## 2017-03-15 DIAGNOSIS — M1712 Unilateral primary osteoarthritis, left knee: Secondary | ICD-10-CM | POA: Diagnosis not present

## 2017-04-12 DIAGNOSIS — H15101 Unspecified episcleritis, right eye: Secondary | ICD-10-CM | POA: Diagnosis not present

## 2017-04-19 ENCOUNTER — Ambulatory Visit: Payer: BLUE CROSS/BLUE SHIELD | Admitting: Family Medicine

## 2017-04-19 ENCOUNTER — Encounter: Payer: Self-pay | Admitting: Family Medicine

## 2017-04-19 VITALS — BP 130/80 | HR 94 | Temp 99.6°F | Wt 164.0 lb

## 2017-04-19 DIAGNOSIS — J111 Influenza due to unidentified influenza virus with other respiratory manifestations: Secondary | ICD-10-CM

## 2017-04-19 LAB — POCT INFLUENZA A/B
INFLUENZA A, POC: POSITIVE — AB
Influenza B, POC: NEGATIVE

## 2017-04-19 MED ORDER — OSELTAMIVIR PHOSPHATE 75 MG PO CAPS: 75.0000 mg | ORAL_CAPSULE | Freq: Two times a day (BID) | ORAL | 0 refills | Status: DC

## 2017-04-19 MED ORDER — HYDROCODONE-HOMATROPINE 5-1.5 MG/5ML PO SYRP: 5.0000 mL | ORAL_SOLUTION | Freq: Four times a day (QID) | ORAL | 0 refills | Status: AC | PRN

## 2017-04-19 NOTE — Patient Instructions (Signed)

## 2017-04-19 NOTE — Progress Notes (Signed)
Subjective:     Patient ID: Debra Trevino, female   DOB: Jul 24, 1955, 62 y.o.   MRN: [id number]  HPI Patient seen for acute illness. Onset Wednesday night of headache, body aches, sinus congestion, weakness, fatigue, nausea, cough. She's had couple episodes of vomiting but not consistently. No diarrhea. Had flu vaccine this fall  Past Medical History:  Diagnosis Date  . Bilateral foot pain    morton neuroma, seeing podiatrist  . CTS (carpal tunnel syndrome)    has seen hand specilist  . Depression 03/24/2007   Qualifier: Diagnosis of  By: Arnoldo Morale MD, Balinda Quails   . Gastric ulcer    per patient  . GERD (gastroesophageal reflux disease) 05/27/2014   Sees Dr. Earlean Shawl; hx erosive esophagitis  . Hot flashes    recur when stops HRT  . HTN (hypertension)   . Low back pain    chronic  . Migraines    resolved on HRT for hot flases  . Muscle spasm    very active, muscle spasm after tennis matches   Past Surgical History:  Procedure Laterality Date  . TONSILLECTOMY      reports that  has never smoked. she has never used smokeless tobacco. She reports that she drinks alcohol. She reports that she does not use drugs. family history includes Charcot-Marie-Tooth disease in her father; Hypertension in her unknown relative; Renal cancer in her mother; Schizophrenia in her brother. Allergies  Allergen Reactions  . Codeine     unknown  . Penicillins Swelling    Has patient had a PCN reaction causing immediate rash, facial/tongue/throat swelling, SOB or lightheadedness with hypotension:YES Has patient had a PCN reaction causing severe rash involving mucus membranes or skin necrosis: NO Has patient had a PCN reaction that required hospitalization NO Has patient had a PCN reaction occurring within the last 10 years: NO If all of the above answers are "NO", then may proceed with Cephalosporin use.     Review of Systems  Constitutional: Positive for chills, fatigue and fever.  HENT: Positive for  congestion and sinus pressure.   Respiratory: Positive for cough.   Musculoskeletal: Positive for myalgias.  Neurological: Positive for headaches.       Objective:   Physical Exam  Constitutional: She appears well-developed and well-nourished.  HENT:  Right Ear: External ear normal.  Left Ear: External ear normal.  Mouth/Throat: Oropharynx is clear and moist.  Neck: Neck supple.  Cardiovascular: Normal rate and regular rhythm.  Pulmonary/Chest: Effort normal and breath sounds normal. No respiratory distress. She has no wheezes. She has no rales.       Assessment:     Influenza. Influenza screen is positive.    Plan:     -Tamiflu 75 mg twice daily for 5 days -Hycodan cough syrup 1 teaspoon every 6 hours as needed for severe cough -Tylenol or Motrin as needed for body aches and fever -Follow-up for any persistent or worsening symptoms  Eulas Post MD [facility] at [facility]

## 2017-05-18 DIAGNOSIS — G5602 Carpal tunnel syndrome, left upper limb: Secondary | ICD-10-CM | POA: Diagnosis not present

## 2017-05-18 DIAGNOSIS — M180 Bilateral primary osteoarthritis of first carpometacarpal joints: Secondary | ICD-10-CM | POA: Diagnosis not present

## 2017-05-18 DIAGNOSIS — M79641 Pain in right hand: Secondary | ICD-10-CM | POA: Diagnosis not present

## 2017-05-18 DIAGNOSIS — M79642 Pain in left hand: Secondary | ICD-10-CM | POA: Diagnosis not present

## 2017-05-18 DIAGNOSIS — G5601 Carpal tunnel syndrome, right upper limb: Secondary | ICD-10-CM | POA: Diagnosis not present

## 2017-06-19 DIAGNOSIS — M1712 Unilateral primary osteoarthritis, left knee: Secondary | ICD-10-CM | POA: Diagnosis not present

## 2017-06-26 DIAGNOSIS — M1712 Unilateral primary osteoarthritis, left knee: Secondary | ICD-10-CM | POA: Diagnosis not present

## 2017-07-05 DIAGNOSIS — M1712 Unilateral primary osteoarthritis, left knee: Secondary | ICD-10-CM | POA: Diagnosis not present

## 2017-07-16 DIAGNOSIS — M150 Primary generalized (osteo)arthritis: Secondary | ICD-10-CM | POA: Diagnosis not present

## 2017-07-16 DIAGNOSIS — M2550 Pain in unspecified joint: Secondary | ICD-10-CM | POA: Diagnosis not present

## 2017-07-25 DIAGNOSIS — H1045 Other chronic allergic conjunctivitis: Secondary | ICD-10-CM | POA: Diagnosis not present

## 2017-08-26 DIAGNOSIS — H3530 Unspecified macular degeneration: Secondary | ICD-10-CM | POA: Diagnosis not present

## 2017-09-10 IMAGING — MR MR KNEE*L* W/O CM
4 of 5 series · 18 of 40 positions shown · non-contrast
Comparison: None.

CLINICAL DATA: The patient reports a hamstring pull 1 month ago.
Trip and fall onto the left knee 1 week ago. Pain.

EXAM:
MRI OF THE LEFT KNEE WITHOUT CONTRAST
TECHNIQUE: Multiplanar, multisequence MR imaging of the knee was performed. No
intravenous contrast was administered.

[Series 4: PD fat-sat · coronal · 3.5mm · 0.29mm/px · 7 of 22 slices shown (1 of 3)]
[im 1/22]
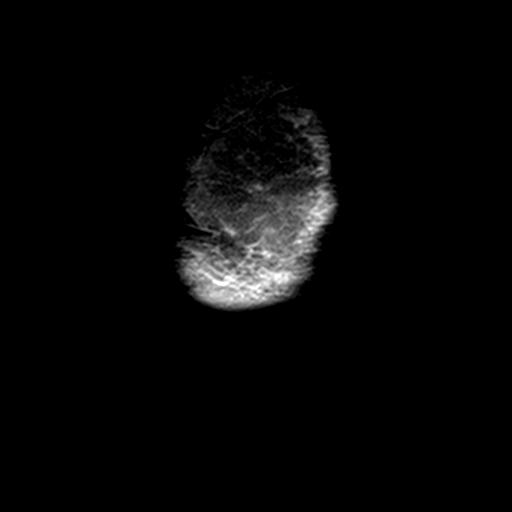
[im 4/22]
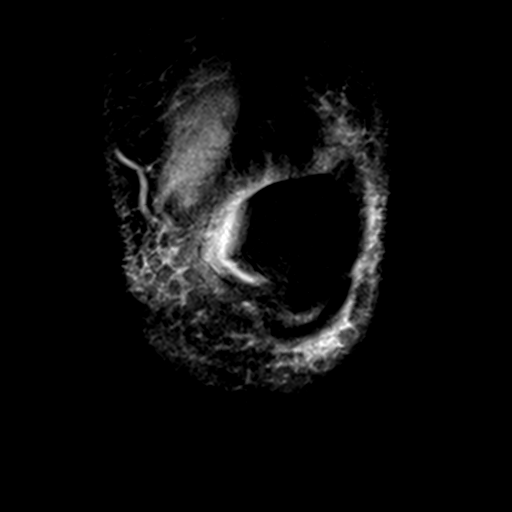
[im 8/22]
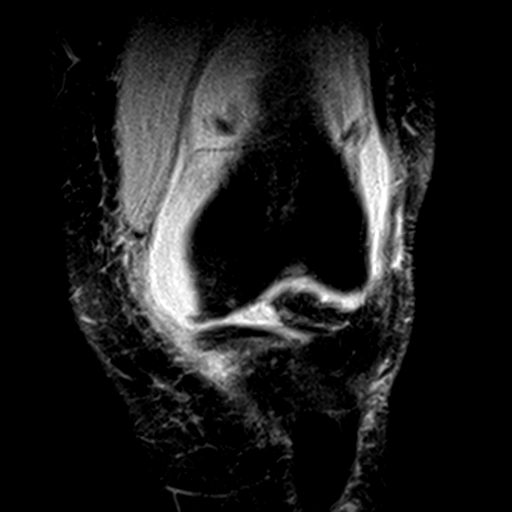
[im 11/22]
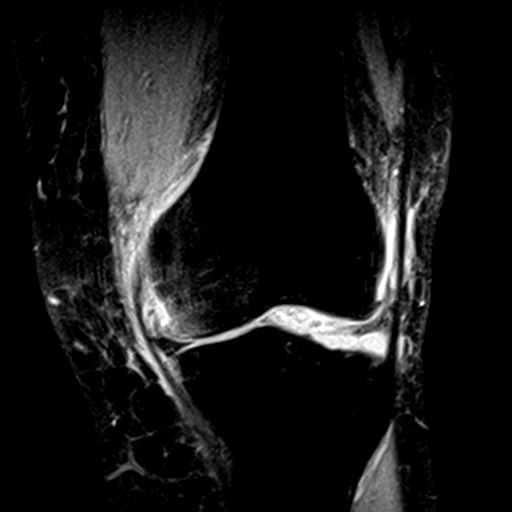
[im 15/22]
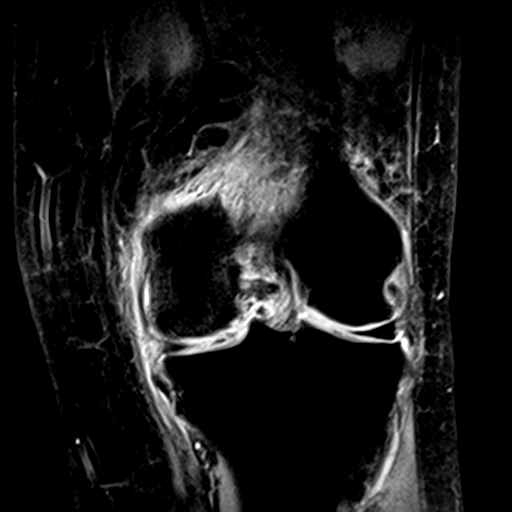
[im 18/22]
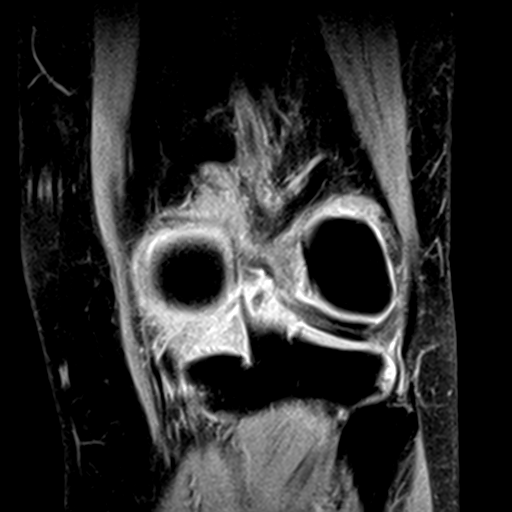
[im 22/22]
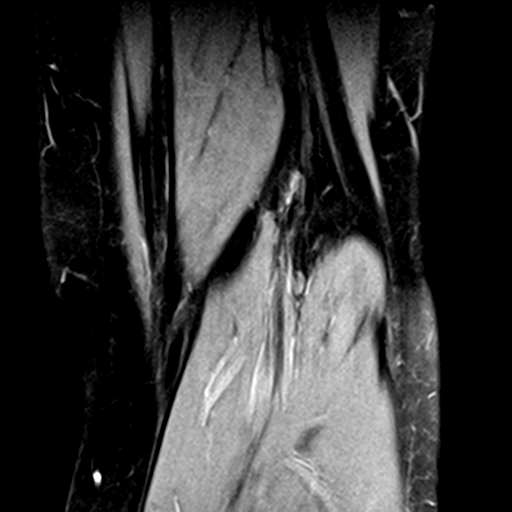

[Series 5: T2 fat-sat · coronal · 3.5mm · 0.29mm/px · 3 of 22 slices shown]
[im 4/22]
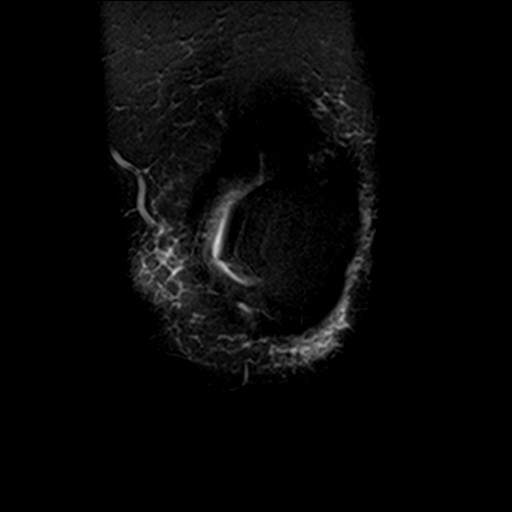
[im 11/22]
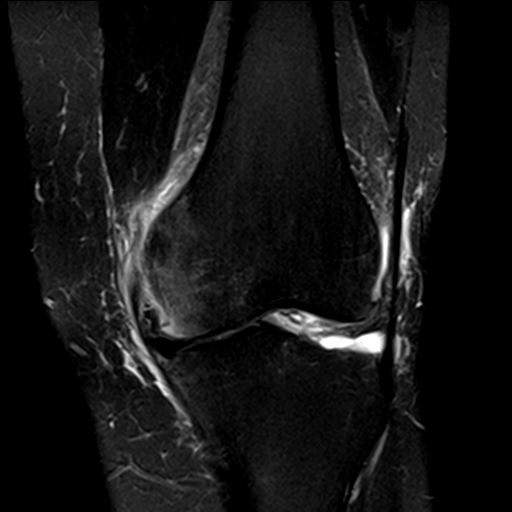
[im 18/22]
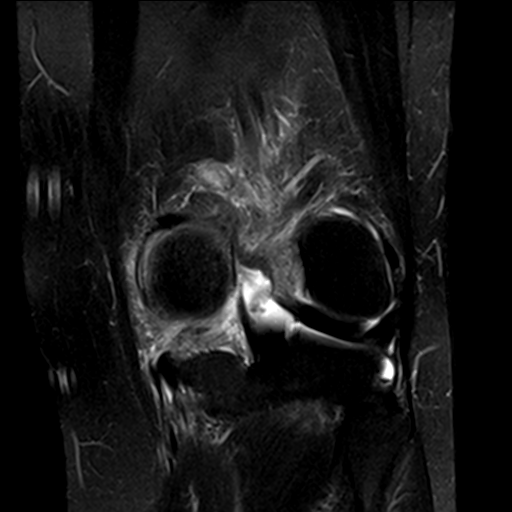

[Series 7: PD fat-sat · sagittal · 3.2mm · 0.29mm/px · 5 of 26 slices shown (2 of 3)]
[im 1/26]
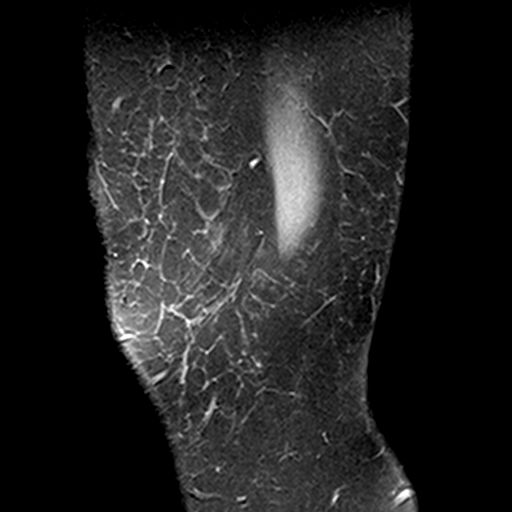
[im 4/26]
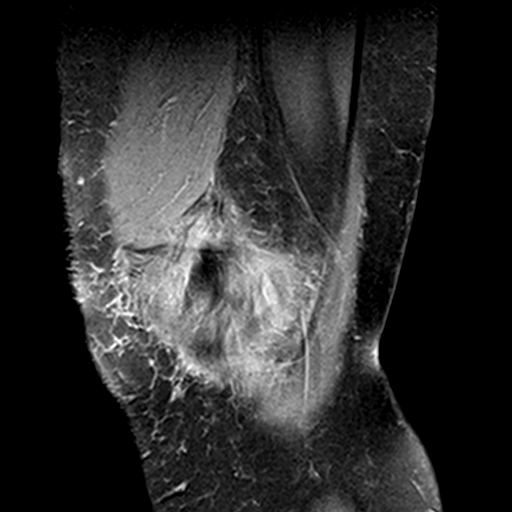
[im 7/26]
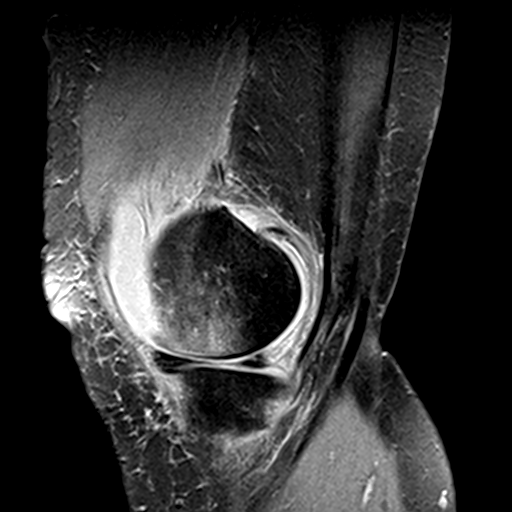
[im 13/26]
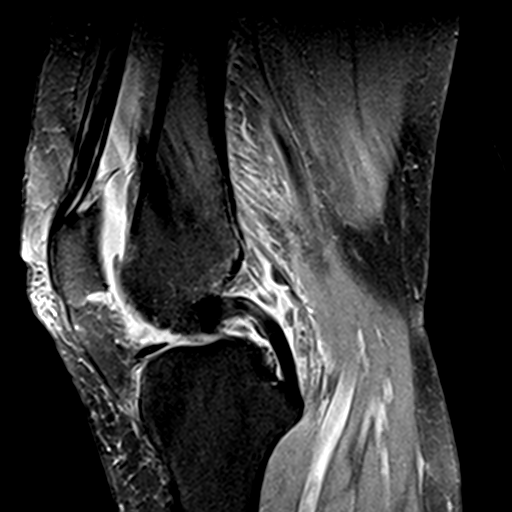
[im 22/26]
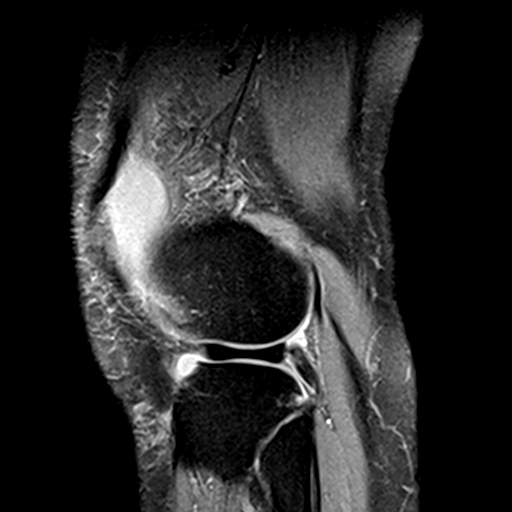

[Series 8: PD fat-sat · axial · 3.5mm · 0.31mm/px · z∈[-36,+40]mm · 3 of 25 slices shown (3 of 3)]
[im 4/25]
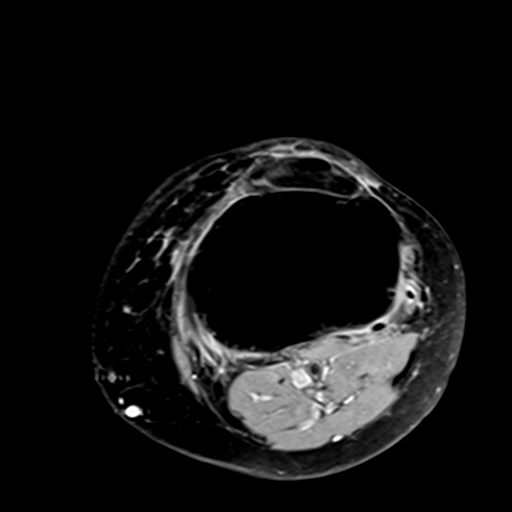
[im 13/25]
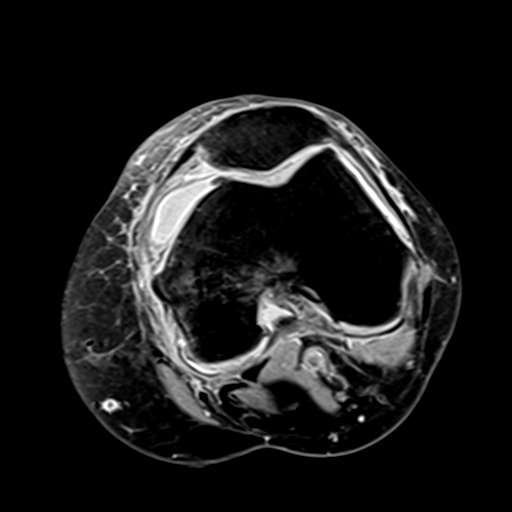
[im 22/25]
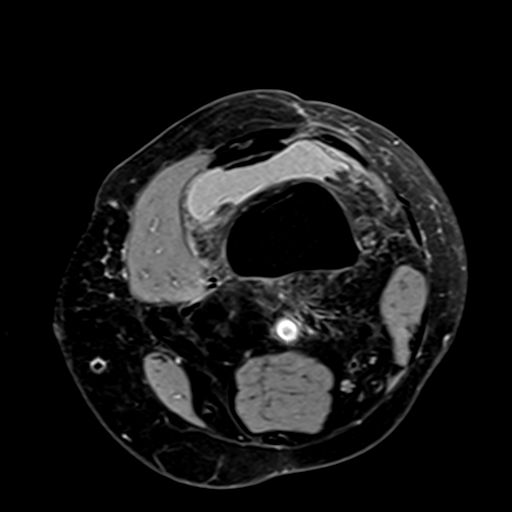

[18 of 40 positions shown; findings below may reference images not displayed]

FINDINGS: MENISCI

Medial meniscus: There is tearing throughout the posterior horn and
body of the medial meniscus. In the posterior horn, the tear is
horizontal in orientation reaching the femoral articular surface. In
the body, the tear has a large flap component with a meniscal
fragment displaced along the medial femoral condyle.

Lateral meniscus: Partial discoid configuration is identified. No
tear.

LIGAMENTS

Cruciates:  Intact.

Collaterals: Edema is present about the MCL consistent with grade 1
sprain. No tear. Lateral collateral ligament complex is intact.

CARTILAGE

Patellofemoral: Extensive cartilage loss about the patella is worst
at the apex.

Medial: Markedly thinned throughout with associated joint space
narrowing.

Lateral:  Mildly degenerated.

Joint: Moderate effusion. Edema is seen about the medial compartment
most consistent with synovitis.

Popliteal Fossa:  No Baker's cyst.

Extensor Mechanism:  Intact.

Bones: The patient has a nondisplaced subchondral fracture of the
medial femoral condyle with associated marrow edema.

Other: None.
IMPRESSION: Extensive tearing posterior horn and body of the medial meniscus.
Large flap component in the body with a meniscal fragment displaced
along the medial femoral condyle is noted.

Findings consistent with an acute or subacute nondisplaced
subchondral fracture of the medial femoral condyle.

Tricompartmental osteoarthritis appears advanced in the medial and
patellofemoral compartments.

Synovitis about the medial compartment is consistent with change
related to subchondral fracture meniscal tearing.

## 2017-09-26 DIAGNOSIS — H15101 Unspecified episcleritis, right eye: Secondary | ICD-10-CM | POA: Diagnosis not present

## 2017-09-26 DIAGNOSIS — H01009 Unspecified blepharitis unspecified eye, unspecified eyelid: Secondary | ICD-10-CM | POA: Diagnosis not present

## 2017-09-26 DIAGNOSIS — H2513 Age-related nuclear cataract, bilateral: Secondary | ICD-10-CM | POA: Diagnosis not present

## 2017-10-09 ENCOUNTER — Telehealth: Payer: Self-pay | Admitting: Cardiovascular Disease

## 2017-10-09 ENCOUNTER — Encounter (HOSPITAL_COMMUNITY): Payer: Self-pay | Admitting: Emergency Medicine

## 2017-10-09 ENCOUNTER — Emergency Department (HOSPITAL_COMMUNITY)
Admission: EM | Admit: 2017-10-09 | Discharge: 2017-10-09 | Disposition: A | Discharge status: Home / Self Care | Payer: BLUE CROSS/BLUE SHIELD | Attending: Emergency Medicine | Admitting: Emergency Medicine

## 2017-10-09 ENCOUNTER — Other Ambulatory Visit: Payer: Self-pay

## 2017-10-09 ENCOUNTER — Emergency Department (HOSPITAL_COMMUNITY): Payer: BLUE CROSS/BLUE SHIELD

## 2017-10-09 DIAGNOSIS — I10 Essential (primary) hypertension: Secondary | ICD-10-CM | POA: Insufficient documentation

## 2017-10-09 DIAGNOSIS — R079 Chest pain, unspecified: Secondary | ICD-10-CM | POA: Diagnosis not present

## 2017-10-09 DIAGNOSIS — Z79899 Other long term (current) drug therapy: Secondary | ICD-10-CM | POA: Diagnosis not present

## 2017-10-09 DIAGNOSIS — R0789 Other chest pain: Secondary | ICD-10-CM | POA: Insufficient documentation

## 2017-10-09 DIAGNOSIS — Z7982 Long term (current) use of aspirin: Secondary | ICD-10-CM | POA: Insufficient documentation

## 2017-10-09 LAB — BASIC METABOLIC PANEL
Anion gap: 8 (ref 5–15)
BUN: 17 mg/dL (ref 8–23)
CO2: 26 mmol/L (ref 22–32)
Calcium: 9.3 mg/dL (ref 8.9–10.3)
Chloride: 107 mmol/L (ref 98–111)
Creatinine, Ser: 0.8 mg/dL (ref 0.44–1.00)
GFR calc non Af Amer: >60 mL/min (ref >60)
Glucose, Bld: 102 mg/dL — ABNORMAL HIGH (ref 70–99)
Potassium: 4.4 mmol/L (ref 3.5–5.1)
Sodium: 141 mmol/L (ref 135–145)

## 2017-10-09 LAB — CBC
HCT: 43.4 % (ref 36.0–46.0)
Hemoglobin: 14.6 g/dL (ref 12.0–15.0)
MCH: 31.8 pg (ref 26.0–34.0)
MCHC: 33.6 g/dL (ref 30.0–36.0)
MCV: 94.6 fL (ref 78.0–100.0)
PLATELETS: 258 10*3/uL (ref 150–400)
RBC: 4.59 MIL/uL (ref 3.87–5.11)
RDW: 12.8 % (ref 11.5–15.5)
WBC: 8 10*3/uL (ref 4.0–10.5)

## 2017-10-09 LAB — I-STAT TROPONIN, ED: Troponin i, poc: 0 ng/mL (ref 0.00–0.08)

## 2017-10-09 LAB — TROPONIN I: Troponin I: <0.03 ng/mL (ref <0.03)

## 2017-10-09 NOTE — Telephone Encounter (Signed)
New Message:       Pt c/o of Chest Pain: STAT if CP now or developed within 24 hours  1. Are you having CP right now? Yes chest discomfort  2. Are you experiencing any other symptoms (ex. SOB, nausea, vomiting, sweating)? SOB  3. How long have you been experiencing CP? Two days  4. Is your CP continuous or coming and going? Coming and going  5. Have you taken Nitroglycerin? No      Pt states she is having chest discomfort but really can't describe how it's making her feel. ?

## 2017-10-09 NOTE — Telephone Encounter (Signed)
Spoke with pt who states she has been experiencing off and on chest pain that started last night. She reports today it has been a little more frequent. She states she has some SOB but at her baseline. Pt requesting to been seen in office today. Pt instructed if currently have pain, I would recommend going to ED for further evaluations. Pt verbalized understanding

## 2017-10-09 NOTE — Discharge Instructions (Signed)
See your Physician for recheck in 2-3 days.  Return if symptoms worsen or change

## 2017-10-09 NOTE — ED Triage Notes (Signed)
Patient complains of constant left sided chest pressure that started last night. Denies other symptoms. Patient alert, oriented, and in no apparent distress at this time.

## 2017-10-10 NOTE — ED Provider Notes (Signed)
[facility] EMERGENCY DEPARTMENT Provider Note   CSN: [id number] Arrival date & time: 10/09/17  1211     History   Chief Complaint Chief Complaint  Patient presents with  . Chest Pain    HPI Debra Trevino is a 62 y.o. female.  The history is provided by the patient. No language interpreter was used.  Chest Pain   This is a new problem. The current episode started 3 to 5 hours ago. The problem has been resolved. The pain is associated with exertion. The pain is mild. The quality of the pain is described as brief. The pain does not radiate. Pertinent negatives include no abdominal pain and no nausea. She has tried nothing for the symptoms. The treatment provided no relief. There are no known risk factors.  Her past medical history is significant for hypertension.  Procedure history is positive for exercise treadmill test.    Past Medical History:  Diagnosis Date  . Bilateral foot pain    morton neuroma, seeing podiatrist  . CTS (carpal tunnel syndrome)    has seen hand specilist  . Depression 03/24/2007   Qualifier: Diagnosis of  By: Arnoldo Morale MD, Balinda Quails   . Gastric ulcer    per patient  . GERD (gastroesophageal reflux disease) 05/27/2014   Sees Dr. Earlean Shawl; hx erosive esophagitis  . Hot flashes    recur when stops HRT  . HTN (hypertension)   . Low back pain    chronic  . Migraines    resolved on HRT for hot flases  . Muscle spasm    very active, muscle spasm after tennis matches    Patient Active Problem List   Diagnosis Date Noted  . Osteoarthritis, knee 03/07/2017  . Family history of premature CAD 03/07/2017  . Atypical chest pain 11/25/2014  . Exertional shortness of breath 11/25/2014  . GERD (gastroesophageal reflux disease) 05/27/2014  . Essential hypertension 05/20/2007  . Depression 03/24/2007  . SYMPTOMATIC MENOPAUSAL/FEMALE CLIMACTERIC STATES 03/24/2007  . ALLERGIC RHINITIS 09/30/2006    Past Surgical History:  Procedure  Laterality Date  . TONSILLECTOMY       OB History   None      Home Medications    Prior to Admission medications   Medication Sig Start Date End Date Taking? Authorizing Provider  aspirin 81 MG chewable tablet Chew by mouth daily.   Yes [provider]  bisoprolol-hydrochlorothiazide (ZIAC) 2.5-6.25 MG tablet Take 1 tablet by mouth daily. 03/07/17  Yes Burchette, Alinda Sierras, MD  citalopram (CELEXA) 20 MG tablet Take 1 tablet (20 mg total) by mouth daily. 03/11/17  Yes Burchette, Alinda Sierras, MD  cyclobenzaprine (FLEXERIL) 10 MG tablet Take 10 mg by mouth 3 (three) times daily as needed for muscle spasms.   Yes [provider]  fluorometholone (FML) 0.1 % ophthalmic suspension Place 1 drop into both eyes 4 (four) times daily. 10/03/17  Yes [provider]  meloxicam (MOBIC) 15 MG tablet Take 15 mg by mouth daily.   Yes [provider]  Menthol-Methyl Salicylate (GRX ANALGESIC BALM) OINT Apply 1 application topically as needed (pain).   Yes [provider]  naproxen sodium (ALEVE) 220 MG tablet Take 220 mg by mouth as needed (pain).   Yes [provider]  omeprazole (PRILOSEC) 40 MG capsule Take 40 mg by mouth daily. 10/29/14  Yes [provider]  Turmeric 400 MG CAPS Take 1 capsule by mouth daily.   Yes [provider]  Family History Family History  Problem Relation Age of Onset  . Charcot-Marie-Tooth disease Father   . Renal cancer Mother   . Schizophrenia Brother   . Hypertension Unknown     Social History Social History   Tobacco Use  . Smoking status: Never Smoker  . Smokeless tobacco: Never Used  Substance Use Topics  . Alcohol use: Yes    Comment: glass of wine each day  . Drug use: No     Allergies   Penicillins   Review of Systems Review of Systems  Cardiovascular: Positive for chest pain.  Gastrointestinal: Negative for abdominal pain and nausea.  All other systems reviewed and are  negative.    Physical Exam Updated Vital Signs BP 123/63   Pulse (!) 52   Temp (!) 97.4 F (36.3 C) (Oral)   Resp 18   SpO2 97%   Physical Exam  Constitutional: She is oriented to person, place, and time. She appears well-developed and well-nourished.  HENT:  Head: Normocephalic.  Eyes: EOM are normal.  Neck: Normal range of motion.  Cardiovascular: Normal rate, regular rhythm and normal pulses.  Pulmonary/Chest: Effort normal. She has no decreased breath sounds.  Abdominal: Soft. She exhibits no distension.  Musculoskeletal: Normal range of motion.  Neurological: She is alert and oriented to person, place, and time.  Psychiatric: She has a normal mood and affect.  Nursing note and vitals reviewed.    ED Treatments / Results  Labs (all labs ordered are listed, but only abnormal results are displayed) Labs Reviewed  BASIC METABOLIC PANEL - Abnormal; Notable for the following components:      Result Value   Glucose, Bld 102 (*)    All other components within normal limits  CBC  TROPONIN I  I-STAT TROPONIN, ED    EKG EKG Interpretation  Date/Time:  Wednesday October 09 2017 12:15:55 EDT Ventricular Rate:  65 PR Interval:  180 QRS Duration: 88 QT Interval:  410 QTC Calculation: 426 R Axis:   68 Text Interpretation:  Normal sinus rhythm with sinus arrhythmia Normal ECG No significant change since last tracing Confirmed by Merrily Pew [id number]) on 10/09/2017 3:13:46 PM   Radiology Dg Chest 2 View  Result Date: 10/09/2017 CLINICAL DATA:  Chest pain since yesterday EXAM: CHEST - 2 VIEW COMPARISON:  Chest CT 01/02/2016 FINDINGS: Normal heart size and mediastinal contours. Artifact from EKG leads. There is no edema, consolidation, effusion, or pneumothorax. No osseous findings IMPRESSION: No evidence of active disease. Electronically Signed   By: Monte Fantasia M.D.   On: 10/09/2017 12:48    Procedures Procedures (including critical care time)  Medications Ordered  in ED Medications - No data to display   Initial Impression / Assessment and Plan / ED Course  I have reviewed the triage vital signs and the nursing notes.  Pertinent labs & imaging results that were available during my care of the patient were reviewed by me and considered in my medical decision making (see chart for details).     MDM troponin negative x 2.  EKG nonacute.  Pt is not having chest pain currently.  Pt advised to follow up with her MD for recheck   Final Clinical Impressions(s) / ED Diagnoses   Final diagnoses:  Atypical chest pain    ED Discharge Orders    None    An After Visit Summary was printed and given to the patient.    Fransico Meadow, [place] 10/10/17 0112    Mesner, Corene Cornea,  MD 10/10/17 1504

## 2017-11-22 DIAGNOSIS — G5601 Carpal tunnel syndrome, right upper limb: Secondary | ICD-10-CM | POA: Diagnosis not present

## 2017-11-29 DIAGNOSIS — H01009 Unspecified blepharitis unspecified eye, unspecified eyelid: Secondary | ICD-10-CM | POA: Diagnosis not present

## 2017-11-29 DIAGNOSIS — H1013 Acute atopic conjunctivitis, bilateral: Secondary | ICD-10-CM | POA: Diagnosis not present

## 2017-12-06 DIAGNOSIS — M79641 Pain in right hand: Secondary | ICD-10-CM | POA: Diagnosis not present

## 2017-12-24 DIAGNOSIS — H01009 Unspecified blepharitis unspecified eye, unspecified eyelid: Secondary | ICD-10-CM | POA: Diagnosis not present

## 2017-12-24 DIAGNOSIS — Z1231 Encounter for screening mammogram for malignant neoplasm of breast: Secondary | ICD-10-CM | POA: Diagnosis not present

## 2017-12-24 DIAGNOSIS — H04123 Dry eye syndrome of bilateral lacrimal glands: Secondary | ICD-10-CM | POA: Diagnosis not present

## 2017-12-24 LAB — HM MAMMOGRAPHY

## 2018-02-19 ENCOUNTER — Other Ambulatory Visit: Payer: Self-pay | Admitting: Family Medicine

## 2018-04-27 ENCOUNTER — Other Ambulatory Visit: Payer: Self-pay | Admitting: Family Medicine

## 2018-05-08 ENCOUNTER — Other Ambulatory Visit: Payer: Self-pay | Admitting: Family Medicine

## 2018-05-12 ENCOUNTER — Encounter: Payer: Self-pay | Admitting: Family Medicine

## 2018-05-12 ENCOUNTER — Other Ambulatory Visit: Payer: Self-pay

## 2018-05-12 ENCOUNTER — Ambulatory Visit: Payer: BLUE CROSS/BLUE SHIELD | Admitting: Family Medicine

## 2018-05-12 VITALS — BP 112/70 | HR 63 | Temp 97.5°F | Ht 62.25 in | Wt 165.4 lb

## 2018-05-12 DIAGNOSIS — I10 Essential (primary) hypertension: Secondary | ICD-10-CM | POA: Diagnosis not present

## 2018-05-12 DIAGNOSIS — Z8249 Family history of ischemic heart disease and other diseases of the circulatory system: Secondary | ICD-10-CM

## 2018-05-12 DIAGNOSIS — N951 Menopausal and female climacteric states: Secondary | ICD-10-CM

## 2018-05-12 MED ORDER — BISOPROLOL-HYDROCHLOROTHIAZIDE 2.5-6.25 MG PO TABS: 1.0000 | ORAL_TABLET | Freq: Every day | ORAL | 3 refills | Status: DC

## 2018-05-12 MED ORDER — CITALOPRAM HYDROBROMIDE 20 MG PO TABS: 20.0000 mg | ORAL_TABLET | Freq: Every day | ORAL | 3 refills | Status: DC

## 2018-05-12 NOTE — Progress Notes (Signed)
Subjective:     Patient ID: Debra Trevino, female   DOB: [birth year], 63 y.o.   MRN: [id number]  HPI Patient seen for follow-up needing couple medication refills.  She has hypertension treated with Ziac.  Blood pressure stable.  No headaches or dizziness.  Compliant with therapy.  She had electrolytes last July which were stable  She has history of some anxiety and irritability and apparently was placed on Celexa several years ago for that.  She is postmenopausal and she tried to take herself off Celexa recently but had increased hot flashes and is requesting going back on that.  She has very strong family history of premature CAD.  She had 2 brothers that died at age 28 of CAD issues.  She had echocardiogram few years ago.  Denies any recent chest pain.  No consistent exercise.  She would like to consider seeing cardiology again to discuss possible further screening  Past Medical History:  Diagnosis Date  . Bilateral foot pain    morton neuroma, seeing podiatrist  . CTS (carpal tunnel syndrome)    has seen hand specilist  . Depression 03/24/2007   Qualifier: Diagnosis of  By: Arnoldo Morale MD, Balinda Quails   . Gastric ulcer    per patient  . GERD (gastroesophageal reflux disease) 05/27/2014   Sees Dr. Earlean Shawl; hx erosive esophagitis  . Hot flashes    recur when stops HRT  . HTN (hypertension)   . Low back pain    chronic  . Migraines    resolved on HRT for hot flases  . Muscle spasm    very active, muscle spasm after tennis matches   Past Surgical History:  Procedure Laterality Date  . CARPAL TUNNEL RELEASE     right hand  . MEDIAL PARTIAL KNEE REPLACEMENT  2017   Right knee  . TONSILLECTOMY      reports that she has never smoked. She has never used smokeless tobacco. She reports current alcohol use. She reports that she does not use drugs. family history includes Charcot-Marie-Tooth disease in her father; Hypertension in an other family member; Renal cancer in her mother;  Schizophrenia in her brother. Allergies  Allergen Reactions  . Penicillins Swelling    Has patient had a PCN reaction causing immediate rash, facial/tongue/throat swelling, SOB or lightheadedness with hypotension:YES Has patient had a PCN reaction causing severe rash involving mucus membranes or skin necrosis: NO Has patient had a PCN reaction that required hospitalization NO Has patient had a PCN reaction occurring within the last 10 years: NO If all of the above answers are "NO", then may proceed with Cephalosporin use.     Review of Systems  Constitutional: Negative for fatigue.  Eyes: Negative for visual disturbance.  Respiratory: Negative for cough, chest tightness, shortness of breath and wheezing.   Cardiovascular: Negative for chest pain, palpitations and leg swelling.  Neurological: Negative for dizziness, seizures, syncope, weakness, light-headedness and headaches.       Objective:   Physical Exam Constitutional:      Appearance: She is well-developed.  Eyes:     Pupils: Pupils are equal, round, and reactive to light.  Neck:     Musculoskeletal: Neck supple.     Thyroid: No thyromegaly.     Vascular: No JVD.  Cardiovascular:     Rate and Rhythm: Normal rate and regular rhythm.     Heart sounds: No gallop.   Pulmonary:     Effort: Pulmonary effort is normal. No respiratory distress.  Breath sounds: Normal breath sounds. No wheezing or rales.  Neurological:     Mental Status: She is alert.        Assessment:     #1 hypertension stable and at goal  #2 postmenopausal hot flushes  #3+ family history of premature CAD    Plan:     -Refill Ziac and Celexa for 1 year -Patient would like to consider follow-up with cardiology to discuss pros and cons of further screening for CAD given positive family history as above.  She has not had any recent exertional chest symptoms  Eulas Post MD [facility] at [facility]

## 2018-05-22 DIAGNOSIS — H01009 Unspecified blepharitis unspecified eye, unspecified eyelid: Secondary | ICD-10-CM | POA: Diagnosis not present

## 2018-05-22 DIAGNOSIS — H04123 Dry eye syndrome of bilateral lacrimal glands: Secondary | ICD-10-CM | POA: Diagnosis not present

## 2018-06-19 DIAGNOSIS — H04123 Dry eye syndrome of bilateral lacrimal glands: Secondary | ICD-10-CM | POA: Diagnosis not present

## 2018-06-19 DIAGNOSIS — H0102B Squamous blepharitis left eye, upper and lower eyelids: Secondary | ICD-10-CM | POA: Diagnosis not present

## 2018-06-19 DIAGNOSIS — H0102A Squamous blepharitis right eye, upper and lower eyelids: Secondary | ICD-10-CM | POA: Diagnosis not present

## 2018-06-19 DIAGNOSIS — H1045 Other chronic allergic conjunctivitis: Secondary | ICD-10-CM | POA: Diagnosis not present

## 2018-07-08 DIAGNOSIS — K222 Esophageal obstruction: Secondary | ICD-10-CM | POA: Diagnosis not present

## 2018-07-08 DIAGNOSIS — K219 Gastro-esophageal reflux disease without esophagitis: Secondary | ICD-10-CM | POA: Diagnosis not present

## 2018-07-17 DIAGNOSIS — H04123 Dry eye syndrome of bilateral lacrimal glands: Secondary | ICD-10-CM | POA: Diagnosis not present

## 2018-07-17 DIAGNOSIS — H04211 Epiphora due to excess lacrimation, right lacrimal gland: Secondary | ICD-10-CM | POA: Diagnosis not present

## 2018-07-17 DIAGNOSIS — H04212 Epiphora due to excess lacrimation, left lacrimal gland: Secondary | ICD-10-CM | POA: Diagnosis not present

## 2018-07-17 DIAGNOSIS — H04213 Epiphora due to excess lacrimation, bilateral lacrimal glands: Secondary | ICD-10-CM | POA: Diagnosis not present

## 2018-08-13 ENCOUNTER — Telehealth: Payer: Self-pay | Admitting: Cardiology

## 2018-08-13 NOTE — Telephone Encounter (Signed)
smartphone/ consent/ my chart active/ pre reg completed °

## 2018-08-13 NOTE — Telephone Encounter (Signed)
Follow up  ° ° °Pt is returning call  ° ° °Please call back  °

## 2018-08-15 ENCOUNTER — Telehealth (INDEPENDENT_AMBULATORY_CARE_PROVIDER_SITE_OTHER): Payer: PPO | Admitting: Cardiology

## 2018-08-15 ENCOUNTER — Encounter: Payer: Self-pay | Admitting: Cardiology

## 2018-08-15 VITALS — Wt 160.0 lb

## 2018-08-15 DIAGNOSIS — Z7189 Other specified counseling: Secondary | ICD-10-CM

## 2018-08-15 DIAGNOSIS — Z8249 Family history of ischemic heart disease and other diseases of the circulatory system: Secondary | ICD-10-CM

## 2018-08-15 DIAGNOSIS — I10 Essential (primary) hypertension: Secondary | ICD-10-CM

## 2018-08-15 NOTE — Patient Instructions (Signed)
Medication Instructions:  Your Physician recommend you continue on your current medication as directed.    If you need a refill on your cardiac medications before your next appointment, please call your pharmacy.   Lab work: Your physician recommends that you return for lab (lipid, LPA, CRP, A1C)  If you have labs (blood work) drawn today and your tests are completely normal, you will receive your results only by: Marland Kitchen MyChart Message (if you have MyChart) OR . A paper copy in the mail If you have any lab test that is abnormal or we need to change your treatment, we will call you to review the results.  Testing/Procedures: Calcium Score [place] 300  Follow-Up: At [facility], you and your health needs are our priority.  As part of our continuing mission to provide you with exceptional heart care, we have created designated Provider Care Teams.  These Care Teams include your primary Cardiologist (physician) and Advanced Practice Providers (APPs -  Physician Assistants and Nurse Practitioners) who all work together to provide you with the care you need, when you need it. You will need a follow up appointment in 4 weeks.  Please call our office 2 months in advance to schedule this appointment.  You may see Buford Dresser, MD or one of the following Advanced Practice Providers on your designated Care Team:   Rosaria Ferries, PA-C . Jory Sims, DNP, ANP

## 2018-08-15 NOTE — Progress Notes (Signed)
Virtual Visit via Telephone Note   This visit type was conducted due to national recommendations for restrictions regarding the COVID-19 Pandemic (e.g. social distancing) in an effort to limit this patient's exposure and mitigate transmission in our community.  Due to her co-morbid illnesses, this patient is at least at moderate risk for complications without adequate follow up.  This format is felt to be most appropriate for this patient at this time.  The patient did not have access to video technology/had technical difficulties with video requiring transitioning to audio format only (telephone).  All issues noted in this document were discussed and addressed.  No physical exam could be performed with this format.  Please refer to the patient's chart for her  consent to telehealth for [facility].   Date:  08/15/2018   ID:  Debra Trevino, DOB Aug 26, 1955, MRN [id number]  Patient Location: Home Provider Location: Home  PCP:  Eulas Post, MD  Cardiologist:  Buford Dresser, MD Electrophysiologist:  None   Evaluation Performed:  Consultation - Debra Trevino was referred by Dr. Elease Hashimoto for the evaluation of CV risk factors.  Chief Complaint:  Re-establish care to discuss CV risk evaluation and management  History of Present Illness:    Debra Trevino is a 63 y.o. female with hypertension, strong family history of CAD.  The patient does not have symptoms concerning for COVID-19 infection (fever, chills, cough, or new shortness of breath).   Cardiovascular risk factors: Prior clinical ASCVD: none Comorbid conditions, including hypertension, hyperlipidemia, diabetes, chronic kidney disease: hypertension, well controlled Metabolic syndrome/Obesity: BMI 29 Chronic inflammatory conditions: none (just osteoarthritis, no rheumatoid) Tobacco use history: currently about 1/2 ppd, about the last two years. Smoked a long time ago socially, but then picked it back up.  Alcohol: drinks every day, several drinks, either wine, vodka or gin. No illicits Family history: 2 brothers passed away around age 52 from massive MI. Older brother had some swelling of feet/discoloration, but no known CAD in either prior. Older brother was a smoker, younger brother did not. Mother died age 11 from a MVC, had incidental cancer finding. Father died age 68 from Charcot-Marie-Tooth. Neither parent had ASCVD. Didn't know grandparents, thinks she might have an an uncle with MI. No known premature disease. Prior cardiac testing and/or incidental findings on other testing (ie coronary calcium): echo 2016 normal Exercise level: playing tennis two days/week on the weekend. Does walk occasionally. Current diet: vegetables, salads. Two sons and husband likes carbs/meat but tries to eat healthy.  Last lipids in 2016 (no additional in [place]): Tchol 172, TG 149, HDL 56, LDL 86  On aspirin 81 mg, reviewed current prevention guidelines. Has a history of ulcers in the past, was told to cut back on meloxicam.  Denies chest pain, shortness of breath at rest. No PND, orthopnea, LE edema or unexpected weight gain. No syncope or recent palpitations (had distantly with activity). Has occasional shortness of breath every few days with walking minimal distances (no clear pattern).   Past Medical History:  Diagnosis Date  . Bilateral foot pain    morton neuroma, seeing podiatrist  . CTS (carpal tunnel syndrome)    has seen hand specilist  . Depression 03/24/2007   Qualifier: Diagnosis of  By: Arnoldo Morale MD, Balinda Quails   . Gastric ulcer    per patient  . GERD (gastroesophageal reflux disease) 05/27/2014   Sees Dr. Earlean Shawl; hx erosive esophagitis  . Hot flashes    recur when stops  HRT  . HTN (hypertension)   . Low back pain    chronic  . Migraines    resolved on HRT for hot flases  . Muscle spasm    very active, muscle spasm after tennis matches   Past Surgical History:  Procedure Laterality Date  .  CARPAL TUNNEL RELEASE     right hand  . MEDIAL PARTIAL KNEE REPLACEMENT  2017   Right knee  . TONSILLECTOMY       Current Meds  Medication Sig  . aspirin 81 MG chewable tablet Chew by mouth daily.  . bisoprolol-hydrochlorothiazide (ZIAC) 2.5-6.25 MG tablet Take 1 tablet by mouth daily.  . citalopram (CELEXA) 20 MG tablet Take 1 tablet (20 mg total) by mouth daily.  . cyclobenzaprine (FLEXERIL) 10 MG tablet Take 10 mg by mouth 3 (three) times daily as needed for muscle spasms.  . fluorometholone (FML) 0.1 % ophthalmic suspension Place 1 drop into both eyes 4 (four) times daily.  . meloxicam (MOBIC) 15 MG tablet Take 15 mg by mouth daily.  . Menthol-Methyl Salicylate (GRX ANALGESIC BALM) OINT Apply 1 application topically as needed (pain).  Marland Kitchen omeprazole (PRILOSEC) 40 MG capsule Take 40 mg by mouth daily.     Allergies:   Penicillins   Social History   Tobacco Use  . Smoking status: Never Smoker  . Smokeless tobacco: Never Used  Substance Use Topics  . Alcohol use: Yes    Comment: glass of wine each day  . Drug use: No     Family Hx: The patient's family history includes Charcot-Marie-Tooth disease in her father; Hypertension in an other family member; Renal cancer in her mother; Schizophrenia in her brother.  ROS:   Please see the history of present illness.    Constitutional: Negative for chills, fever, night sweats, unintentional weight loss  HENT: Negative for ear pain and hearing loss.   Eyes: Negative for loss of vision and eye pain.  Respiratory: Negative for sputum, wheezing.  positive for mild cough Cardiovascular: See HPI. Gastrointestinal: Negative for abdominal pain, melena, and hematochezia.  Genitourinary: Negative for dysuria and hematuria.  Musculoskeletal: Negative for falls and myalgias.  Skin: Negative for itching and rash.  Neurological: Negative for focal weakness, focal sensory changes and loss of consciousness.  Endo/Heme/Allergies: Does not  bruise/bleed easily.  All other systems reviewed and are negative.   Prior CV studies:   The following studies were reviewed today: Echo 2016 normal  Labs/Other Tests and Data Reviewed:    EKG:  An ECG dated 10/10/17 was personally reviewed today and demonstrated:  SR, sinus arrhythmia  Recent Labs: 10/09/2017: BUN 17; Creatinine, Ser 0.80; Hemoglobin 14.6; Platelets 258; Potassium 4.4; Sodium 141   Recent Lipid Panel Lab Results  Component Value Date/Time   CHOL 172 08/16/2014 10:04 AM   TRIG 149.0 08/16/2014 10:04 AM   HDL 56.20 08/16/2014 10:04 AM   CHOLHDL 3 08/16/2014 10:04 AM   LDLCALC 86 08/16/2014 10:04 AM    Wt Readings from Last 3 Encounters:  08/15/18 160 lb (72.6 kg)  05/12/18 165 lb 6.4 oz (75 kg)  04/19/17 164 lb (74.4 kg)     Objective:    Vital Signs:  Wt 160 lb (72.6 kg)   BMI 29.03 kg/m    Speaking comfortably over the phone, in NAD  ASSESSMENT & PLAN:   CV risk assessment: strong family history. Wants to know her current risk and how to manage in the future. Discussed options for testing at length,  what they show, how we use them for risk prevention -Bloodwork: lipids, A1c, Lp(a) -Calcium score -reviewed CV risk factors, prevention below -based on results of testing, will discuss statin indication -already on aspirin 81 mg. Reviewed recent data on aspirin in primary prevention. Will assess need to continue vs. Stop based on risk factor prediction from test results  Primary prevention: -recommend heart healthy/Mediterranean diet, with whole grains, fruits, vegetable, fish, lean meats, nuts, and olive oil. Limit salt. -recommend moderate walking, 3-5 times/week for 30-50 minutes each session. Aim for at least 150 minutes.week. Goal should be pace of 3 miles/hours, or walking 1.5 miles in 30 minutes -recommend avoidance of tobacco products. Avoid excess alcohol. -Additional risk factor control:  -Diabetes: A1c is 5.5 in 2016, will update  -Lipids:  last in 2016, will update  -Blood pressure control: history of hypertension, no available BP today  -Weight: BMI 29 -ASCVD risk score: will update with new labs.  COVID-19 Education: The signs and symptoms of COVID-19 were discussed with the patient and how to seek care for testing (follow up with PCP or arrange E-visit).  The importance of social distancing was discussed today.  Time:   Today, I have spent >40 minutes with the patient with telehealth technology discussing the above problems.    Patient Instructions  Medication Instructions:  Your Physician recommend you continue on your current medication as directed.    If you need a refill on your cardiac medications before your next appointment, please call your pharmacy.   Lab work: Your physician recommends that you return for lab (lipid, LPA, CRP, A1C)  If you have labs (blood work) drawn today and your tests are completely normal, you will receive your results only by: Marland Kitchen MyChart Message (if you have MyChart) OR . A paper copy in the mail If you have any lab test that is abnormal or we need to change your treatment, we will call you to review the results.  Testing/Procedures: Calcium Score [place] [address]  Follow-Up: At [facility], you and your health needs are our priority.  As part of our continuing mission to provide you with exceptional heart care, we have created designated Provider Care Teams.  These Care Teams include your primary Cardiologist (physician) and Advanced Practice Providers (APPs -  Physician Assistants and Nurse Practitioners) who all work together to provide you with the care you need, when you need it. You will need a follow up appointment in 4 weeks.  Please call our office 2 months in advance to schedule this appointment.  You may see Buford Dresser, MD or one of the following Advanced Practice Providers on your designated Care Team:   Rosaria Ferries, PA-C . Jory Sims,  DNP, ANP       Medication Adjustments/Labs and Tests Ordered: Current medicines are reviewed at length with the patient today.  Concerns regarding medicines are outlined above.   Tests Ordered: Orders Placed This Encounter  Procedures  . CT CARDIAC SCORING  . Lipid panel  . Lipoprotein A (LPA)  . Hemoglobin A1c  . High sensitivity CRP    Medication Changes: No orders of the defined types were placed in this encounter.   Disposition:  Follow up 4 weeks  Signed, Buford Dresser, MD  08/15/2018    [facility]

## 2018-08-17 LAB — LIPOPROTEIN A (LPA): Lipoprotein (a): 82.3 nmol/L — ABNORMAL HIGH (ref <75.0)

## 2018-08-17 LAB — HEMOGLOBIN A1C
Est. average glucose Bld gHb Est-mCnc: 120 mg/dL
Hgb A1c MFr Bld: 5.8 % — ABNORMAL HIGH (ref 4.8–5.6)

## 2018-08-17 LAB — LIPID PANEL
Chol/HDL Ratio: 3.8 ratio (ref 0.0–4.4)
Cholesterol, Total: 185 mg/dL (ref 100–199)
HDL: 49 mg/dL (ref >39)
LDL Calculated: 87 mg/dL (ref 0–99)
Triglycerides: 244 mg/dL — ABNORMAL HIGH (ref 0–149)
VLDL Cholesterol Cal: 49 mg/dL — ABNORMAL HIGH (ref 5–40)

## 2018-08-17 LAB — HIGH SENSITIVITY CRP: CRP, High Sensitivity: 1.68 mg/L (ref 0.00–3.00)

## 2018-08-20 ENCOUNTER — Telehealth: Payer: Self-pay | Admitting: Licensed Clinical Social Worker

## 2018-08-20 NOTE — Telephone Encounter (Signed)
CSW referred to assist patient with obtaining a BP cuff. CSW contacted patient to inform cuff will be delivered to home next week. Patient grateful for support and assistance. CSW available as needed. Jackie Gid Schoffstall, LCSW, CCSW-MCS [phone]  

## 2018-08-21 DIAGNOSIS — H0102B Squamous blepharitis left eye, upper and lower eyelids: Secondary | ICD-10-CM | POA: Diagnosis not present

## 2018-08-21 DIAGNOSIS — H0102A Squamous blepharitis right eye, upper and lower eyelids: Secondary | ICD-10-CM | POA: Diagnosis not present

## 2018-08-21 DIAGNOSIS — H04213 Epiphora due to excess lacrimation, bilateral lacrimal glands: Secondary | ICD-10-CM | POA: Diagnosis not present

## 2018-08-21 DIAGNOSIS — H04123 Dry eye syndrome of bilateral lacrimal glands: Secondary | ICD-10-CM | POA: Diagnosis not present

## 2018-10-09 ENCOUNTER — Other Ambulatory Visit: Payer: Self-pay

## 2018-10-09 ENCOUNTER — Ambulatory Visit (INDEPENDENT_AMBULATORY_CARE_PROVIDER_SITE_OTHER)
Admission: RE | Admit: 2018-10-09 | Discharge: 2018-10-09 | Disposition: A | Discharge status: Home / Self Care | Payer: Self-pay | Source: Ambulatory Visit | Attending: Cardiology | Admitting: Cardiology

## 2018-10-09 DIAGNOSIS — Z8249 Family history of ischemic heart disease and other diseases of the circulatory system: Secondary | ICD-10-CM

## 2018-10-10 ENCOUNTER — Telehealth: Payer: Self-pay | Admitting: Cardiology

## 2018-10-10 NOTE — Telephone Encounter (Signed)
New Message    Pt is returning a call to [place]    Please call back

## 2018-10-10 NOTE — Telephone Encounter (Signed)
Pt updated with lab results and Dr. Judeth Cornfield response to [employer]. Pt voiced understanding.

## 2018-10-15 ENCOUNTER — Ambulatory Visit: Payer: BLUE CROSS/BLUE SHIELD | Admitting: Cardiovascular Disease

## 2018-11-11 ENCOUNTER — Telehealth: Payer: Self-pay | Admitting: Family Medicine

## 2018-12-10 ENCOUNTER — Other Ambulatory Visit: Payer: Self-pay

## 2018-12-10 DIAGNOSIS — Z20822 Contact with and (suspected) exposure to covid-19: Secondary | ICD-10-CM

## 2018-12-11 LAB — NOVEL CORONAVIRUS, NAA: SARS-CoV-2, NAA: NOT DETECTED

## 2018-12-30 LAB — HM MAMMOGRAPHY

## 2019-03-17 ENCOUNTER — Other Ambulatory Visit: Payer: Self-pay | Admitting: Family Medicine

## 2019-04-03 ENCOUNTER — Ambulatory Visit: Payer: Medicare Other | Attending: Internal Medicine

## 2019-04-03 DIAGNOSIS — Z23 Encounter for immunization: Secondary | ICD-10-CM

## 2019-04-03 NOTE — Progress Notes (Signed)
   Covid-19 Vaccination Clinic  Name:  Debra Trevino    MRN: [id number] DOB: 1955-05-27  04/03/2019  Debra Trevino was observed post Covid-19 immunization for 15 minutes without incidence. She was provided with Vaccine Information Sheet and instruction to access the V-Safe system.   Debra Trevino was instructed to call 911 with any severe reactions post vaccine: Marland Kitchen Difficulty breathing  . Swelling of your face and throat  . A fast heartbeat  . A bad rash all over your body  . Dizziness and weakness    Immunizations Administered    Name Date Dose VIS Date Route   Pfizer COVID-19 Vaccine 04/03/2019 12:26 PM 0.3 mL 02/20/2019 Intramuscular   Manufacturer: [place]   Lot: [id number]   [place]: [id number]

## 2019-04-22 ENCOUNTER — Ambulatory Visit: Payer: Medicare Other | Attending: Internal Medicine

## 2019-04-22 DIAGNOSIS — Z23 Encounter for immunization: Secondary | ICD-10-CM

## 2019-04-22 NOTE — Progress Notes (Signed)
   Covid-19 Vaccination Clinic  Name:  Debra Trevino    MRN: [id number] DOB: Dec 16, 1955  04/22/2019  Debra Trevino was observed post Covid-19 immunization for 15 minutes without incidence. She was provided with Vaccine Information Sheet and instruction to access the V-Safe system.   Debra Trevino was instructed to call 911 with any severe reactions post vaccine: Marland Kitchen Difficulty breathing  . Swelling of your face and throat  . A fast heartbeat  . A bad rash all over your body  . Dizziness and weakness    Immunizations Administered    Name Date Dose VIS Date Route   Pfizer COVID-19 Vaccine 04/22/2019 12:10 PM 0.3 mL 02/20/2019 Intramuscular   Manufacturer: [employer]   Lot: [id number]   [place]: [id number]

## 2019-05-10 ENCOUNTER — Other Ambulatory Visit: Payer: Self-pay | Admitting: Family Medicine

## 2019-05-15 ENCOUNTER — Ambulatory Visit: Payer: Medicare Other | Admitting: Family Medicine

## 2019-05-18 ENCOUNTER — Encounter: Payer: Self-pay | Admitting: Family Medicine

## 2019-05-18 ENCOUNTER — Other Ambulatory Visit: Payer: Self-pay

## 2019-05-18 ENCOUNTER — Ambulatory Visit: Payer: Medicare Other | Admitting: Family Medicine

## 2019-05-18 ENCOUNTER — Ambulatory Visit (INDEPENDENT_AMBULATORY_CARE_PROVIDER_SITE_OTHER): Payer: Medicare Other | Admitting: Family Medicine

## 2019-05-18 VITALS — BP 112/78 | HR 69 | Temp 97.8°F | Ht 62.5 in | Wt 159.1 lb

## 2019-05-18 DIAGNOSIS — Z0000 Encounter for general adult medical examination without abnormal findings: Secondary | ICD-10-CM | POA: Diagnosis not present

## 2019-05-18 DIAGNOSIS — Z8249 Family history of ischemic heart disease and other diseases of the circulatory system: Secondary | ICD-10-CM

## 2019-05-18 DIAGNOSIS — R739 Hyperglycemia, unspecified: Secondary | ICD-10-CM | POA: Diagnosis not present

## 2019-05-18 DIAGNOSIS — I10 Essential (primary) hypertension: Secondary | ICD-10-CM | POA: Diagnosis not present

## 2019-05-18 LAB — BASIC METABOLIC PANEL
BUN: 18 mg/dL (ref 6–23)
CO2: 29 mEq/L (ref 19–32)
Calcium: 9.6 mg/dL (ref 8.4–10.5)
Chloride: 104 mEq/L (ref 96–112)
Creatinine, Ser: 0.78 mg/dL (ref 0.40–1.20)
GFR: 73.92 mL/min (ref >60.00)
Glucose, Bld: 118 mg/dL — ABNORMAL HIGH (ref 70–99)
Potassium: 5 mEq/L (ref 3.5–5.1)
Sodium: 136 mEq/L (ref 135–145)

## 2019-05-18 LAB — HEMOGLOBIN A1C: Hgb A1c MFr Bld: 6 % (ref 4.6–6.5)

## 2019-05-18 MED ORDER — BISOPROLOL-HYDROCHLOROTHIAZIDE 2.5-6.25 MG PO TABS: 1.0000 | ORAL_TABLET | Freq: Every day | ORAL | 3 refills | Status: DC

## 2019-05-18 NOTE — Patient Instructions (Addendum)
Preventive Care 66 Years and Older, Female Preventive care refers to lifestyle choices and visits with your health care provider that can promote health and wellness. This includes:  A yearly physical exam. This is also called an annual well check.  Regular dental and eye exams.  Immunizations.  Screening for certain conditions.  Healthy lifestyle choices, such as diet and exercise. What can I expect for my preventive care visit? Physical exam Your health care provider will check:  Height and weight. These may be used to calculate body mass index (BMI), which is a measurement that tells if you are at a healthy weight.  Heart rate and blood pressure.  Your skin for abnormal spots. Counseling Your health care provider may ask you questions about:  Alcohol, tobacco, and drug use.  Emotional well-being.  Home and relationship well-being.  Sexual activity.  Eating habits.  History of falls.  Memory and ability to understand (cognition).  Work and work Statistician.  Pregnancy and menstrual history. What immunizations do I need?  Influenza (flu) vaccine  This is recommended every year. Tetanus, diphtheria, and pertussis (Tdap) vaccine  You may need a Td booster every 10 years. Varicella (chickenpox) vaccine  You may need this vaccine if you have not already been vaccinated. Zoster (shingles) vaccine  You may need this after age 66. Pneumococcal conjugate (PCV13) vaccine  One dose is recommended after age 66. Pneumococcal polysaccharide (PPSV23) vaccine  One dose is recommended after age 72. Measles, mumps, and rubella (MMR) vaccine  You may need at least one dose of MMR if you were born in 1957 or later. You may also need a second dose. Meningococcal conjugate (MenACWY) vaccine  You may need this if you have certain conditions. Hepatitis A vaccine  You may need this if you have certain conditions or if you travel or work in places where you may be exposed  to hepatitis A. Hepatitis B vaccine  You may need this if you have certain conditions or if you travel or work in places where you may be exposed to hepatitis B. Haemophilus influenzae type b (Hib) vaccine  You may need this if you have certain conditions. You may receive vaccines as individual doses or as more than one vaccine together in one shot (combination vaccines). Talk with your health care provider about the risks and benefits of combination vaccines. What tests do I need? Blood tests  Lipid and cholesterol levels. These may be checked every 5 years, or more frequently depending on your overall health.  Hepatitis C test.  Hepatitis B test. Screening  Lung cancer screening. You may have this screening every year starting at age 66 if you have a 30-pack-year history of smoking and currently smoke or have quit within the past 15 years.  Colorectal cancer screening. All adults should have this screening starting at age 66 and continuing until age 15. Your health care provider may recommend screening at age 23 if you are at increased risk. You will have tests every 1-10 years, depending on your results and the type of screening test.  Diabetes screening. This is done by checking your blood sugar (glucose) after you have not eaten for a while (fasting). You may have this done every 1-3 years.  Mammogram. This may be done every 1-2 years. Talk with your health care provider about how often you should have regular mammograms.  BRCA-related cancer screening. This may be done if you have a family history of breast, ovarian, tubal, or peritoneal cancers.  Other tests  Sexually transmitted disease (STD) testing.  Bone density scan. This is done to screen for osteoporosis. You may have this done starting at age 66. Follow these instructions at home: Eating and drinking  Eat a diet that includes fresh fruits and vegetables, whole grains, lean protein, and low-fat dairy products. Limit  your intake of foods with high amounts of sugar, saturated fats, and salt.  Take vitamin and mineral supplements as recommended by your health care provider.  Do not drink alcohol if your health care provider tells you not to drink.  If you drink alcohol: ? Limit how much you have to 0-1 drink a day. ? Be aware of how much alcohol is in your drink. In the [place] one drink equals one 12 oz bottle of beer (355 mL), one 5 oz glass of wine (148 mL), or one 1 oz glass of hard liquor (44 mL). Lifestyle  Take daily care of your teeth and gums.  Stay active. Exercise for at least 30 minutes on 5 or more days each week.  Do not use any products that contain nicotine or tobacco, such as cigarettes, e-cigarettes, and chewing tobacco. If you need help quitting, ask your health care provider.  If you are sexually active, practice safe sex. Use a condom or other form of protection in order to prevent STIs (sexually transmitted infections).  Talk with your health care provider about taking a low-dose aspirin or statin. What's next?  Go to your health care provider once a year for a well check visit.  Ask your health care provider how often you should have your eyes and teeth checked.  Stay up to date on all vaccines. This information is not intended to replace advice given to you by your health care provider. Make sure you discuss any questions you have with your health care provider. Document Revised: 02/20/2018 Document Reviewed: 02/20/2018 Elsevier Patient Education  [employer].  Consider Shinglrix vaccine at some point this year.    We will set up Cologuard

## 2019-05-18 NOTE — Progress Notes (Signed)
Subjective:     Patient ID: Debra Trevino, female   DOB: Aug 07, 1955, 64 y.o.   MRN: [id number]  HPI   Debra Trevino is seen for Welcome to Medicare wellness visit and for medical follow-up.  She has hypertension which is treated with Ziac.  Her blood pressures have been stable.  Occasional dizziness when she first stands up. She is overdue for electrolytes.  She started smoking around age 66 currently half pack per day.  She had smoked briefly earlier in life but had several years in between without smoking.  Less than 30-pack-year history.  History of prediabetes range blood sugars.  She had A1c several months ago 5.8 which was slightly elevated.  No family history of type 2 diabetes.  1.  Risk factors based on Past Medical , Social, and Family history reviewed and as indicated above with no changes  2.  Limitations in physical activities None.  No recent falls. Plays tennis for exercise.   3.  Depression/mood No active depression or anxiety issues PHQ- 2 =0  4.  Hearing No deficits  5.  ADLs independent in all.  6.  Cognitive function (orientation to time and place, language, writing, speech,memory) no short or long term memory issues.  Language and judgement intact.  7.  Home Safety no issues  8.  Height, weight, and visual acuity.all stable.  Wt Readings from Last 3 Encounters:  05/18/19 159 lb 1.6 oz (72.2 kg)  08/15/18 160 lb (72.6 kg)  05/12/18 165 lb 6.4 oz (75 kg)   Visual acuity was 20/13 right eye, 20/13 left eye, and 20/10 both eyes  9.  Counseling discussed Counseled regarding age and gender appropriate preventative screenings and immunizations.  10. Recommendation of preventive services.  She is overdue for colonoscopy.  She has decided she would prefer to do Cologuard since she had no polyps on her initial colonoscopy.  She is planning to get DEXA scan and Pap smear through her gynecologist in April.  We also discussed Shingrix vaccine which she will consider  11.  Labs based on risk factors-hepatitis C antibody, basic metabolic panel (secondary to thiazide use), hemoglobin A1c (past hx of A1C slightly high at 5.8%).  12. Care Plan-as below  13. Other Providers Exie Chrismer, Alinda Sierras, MD General  Buford Dresser, MD Cardiology   Other Patient Care Team Members Specialty  Richmond Campbell, MD Gastroenterology   Dr Marylynn Pearson- GYN  14. Written schedule of screening/prevention services given to patient. Health Maintenance  Topic Date Due  . Hepatitis C Screening  Dec 01, 1955  . PAP SMEAR-Modifier  03/12/2017  . COLONOSCOPY  03/12/2018  . DEXA SCAN  07/05/2018  . HIV Screening  05/26/2024 (Originally 07/04/1968)  . PNA vac Low Risk Adult (2 of 2 - PCV13) 12/02/2019  . MAMMOGRAM  12/29/2020  . TETANUS/TDAP  12/05/2022  . INFLUENZA VACCINE  Completed   15.  Advanced directives-she has living will as well as medical and [place] designations   Review of Systems  Constitutional: Negative for activity change, appetite change, fatigue, fever and unexpected weight change.  HENT: Negative for ear pain, hearing loss, sore throat and trouble swallowing.   Eyes: Negative for visual disturbance.  Respiratory: Negative for cough and shortness of breath.   Cardiovascular: Negative for chest pain and palpitations.  Gastrointestinal: Negative for abdominal pain, blood in stool, constipation and diarrhea.  Genitourinary: Negative for dysuria and hematuria.  Musculoskeletal: Negative for arthralgias, back pain and myalgias.  Skin: Negative for rash.  Neurological: Negative for dizziness, syncope and headaches.  Hematological: Negative for adenopathy.  Psychiatric/Behavioral: Negative for confusion and dysphoric mood.       Objective:   Physical Exam Constitutional:      General: She is not in acute distress.    Appearance: She is well-developed. She is not ill-appearing or toxic-appearing.  HENT:     Head: Normocephalic and  atraumatic.     Right Ear: Tympanic membrane normal.     Left Ear: Tympanic membrane normal.  Eyes:     Pupils: Pupils are equal, round, and reactive to light.  Neck:     Thyroid: No thyromegaly.  Cardiovascular:     Rate and Rhythm: Normal rate and regular rhythm.     Heart sounds: Normal heart sounds. No murmur.  Pulmonary:     Effort: No respiratory distress.     Breath sounds: Normal breath sounds. No wheezing or rales.  Abdominal:     General: Bowel sounds are normal. There is no distension.     Palpations: Abdomen is soft. There is no mass.     Tenderness: There is no abdominal tenderness. There is no guarding or rebound.  Musculoskeletal:        General: Normal range of motion.     Cervical back: Normal range of motion and neck supple.     Right lower leg: No edema.     Left lower leg: No edema.  Lymphadenopathy:     Cervical: No cervical adenopathy.  Skin:    Findings: No rash.  Neurological:     Mental Status: She is alert and oriented to person, place, and time.     Cranial Nerves: No cranial nerve deficit.     Deep Tendon Reflexes: Reflexes normal.  Psychiatric:        Behavior: Behavior normal.        Thought Content: Thought content normal.        Judgment: Judgment normal.        Assessment:     #1 Welcome to Medicare physical exam.  We discussed several health maintenance issues as below.  We did did not obtain EKG as she has had prior EKG recently  #2 hypertension stable and at goal  #3 history of prediabetes range blood sugars.  #4 ongoing nicotine use    Plan:     -We advise checking labs with hepatitis C antibody, basic metabolic panel, [id number]  -Strongly advised to stop smoking.  We also discussed low-dose CT lung cancer screening but she is not a candidate based on the fact that she has had far less than 30-pack-year history.  We discussed criteria for screening  -Discussed Shingrix vaccine.  She had previous Zostavax.  She will check with  insurance and at pharmacy if interested  -Recommend colon cancer screening.  We discussed options of Cologuard versus colonoscopy and she prefers to do Cologuard.  She is aware of approximately 10% risk of false negatives and false positives with Colo-guard  -She plans to get Pap smear and DEXA scan through GYN.  Her mammogram is up-to-date  -Covid vaccine already given  Eulas Post MD [facility]

## 2019-05-19 LAB — HEPATITIS C ANTIBODY
Hepatitis C Ab: NONREACTIVE
SIGNAL TO CUT-OFF: 0.01 (ref <1.00)

## 2019-05-21 ENCOUNTER — Encounter: Payer: Self-pay | Admitting: Family Medicine

## 2019-06-18 ENCOUNTER — Other Ambulatory Visit: Payer: Self-pay | Admitting: Family Medicine

## 2019-06-24 ENCOUNTER — Other Ambulatory Visit: Payer: Self-pay

## 2019-06-24 ENCOUNTER — Emergency Department (HOSPITAL_COMMUNITY)
Admission: EM | Admit: 2019-06-24 | Discharge: 2019-06-24 | Disposition: A | Discharge status: Home / Self Care | Payer: Medicare Other | Attending: Emergency Medicine | Admitting: Emergency Medicine

## 2019-06-24 ENCOUNTER — Encounter (HOSPITAL_COMMUNITY): Payer: Self-pay

## 2019-06-24 DIAGNOSIS — Z79899 Other long term (current) drug therapy: Secondary | ICD-10-CM | POA: Insufficient documentation

## 2019-06-24 DIAGNOSIS — I10 Essential (primary) hypertension: Secondary | ICD-10-CM | POA: Insufficient documentation

## 2019-06-24 DIAGNOSIS — R42 Dizziness and giddiness: Secondary | ICD-10-CM | POA: Diagnosis present

## 2019-06-24 DIAGNOSIS — F17210 Nicotine dependence, cigarettes, uncomplicated: Secondary | ICD-10-CM | POA: Insufficient documentation

## 2019-06-24 DIAGNOSIS — E860 Dehydration: Secondary | ICD-10-CM

## 2019-06-24 DIAGNOSIS — R531 Weakness: Secondary | ICD-10-CM | POA: Insufficient documentation

## 2019-06-24 LAB — BASIC METABOLIC PANEL
Anion gap: 12 (ref 5–15)
BUN: 21 mg/dL (ref 8–23)
CO2: 23 mmol/L (ref 22–32)
Calcium: 9.2 mg/dL (ref 8.9–10.3)
Chloride: 105 mmol/L (ref 98–111)
Creatinine, Ser: 0.72 mg/dL (ref 0.44–1.00)
GFR calc Af Amer: >60 mL/min (ref >60)
GFR calc non Af Amer: >60 mL/min (ref >60)
Glucose, Bld: 131 mg/dL — ABNORMAL HIGH (ref 70–99)
Potassium: 4.2 mmol/L (ref 3.5–5.1)
Sodium: 140 mmol/L (ref 135–145)

## 2019-06-24 LAB — CBC WITH DIFFERENTIAL/PLATELET
Abs Immature Granulocytes: 0.03 10*3/uL (ref 0.00–0.07)
Basophils Absolute: 0 10*3/uL (ref 0.0–0.1)
Basophils Relative: 1 %
Eosinophils Absolute: 0.2 10*3/uL (ref 0.0–0.5)
Eosinophils Relative: 3 %
HCT: 42.8 % (ref 36.0–46.0)
Hemoglobin: 14.4 g/dL (ref 12.0–15.0)
Immature Granulocytes: 0 %
Lymphocytes Relative: 24 %
Lymphs Abs: 1.9 10*3/uL (ref 0.7–4.0)
MCH: 31.6 pg (ref 26.0–34.0)
MCHC: 33.6 g/dL (ref 30.0–36.0)
MCV: 93.9 fL (ref 80.0–100.0)
Monocytes Absolute: 0.7 10*3/uL (ref 0.1–1.0)
Monocytes Relative: 9 %
Neutro Abs: 4.9 10*3/uL (ref 1.7–7.7)
Neutrophils Relative %: 63 %
Platelets: 230 10*3/uL (ref 150–400)
RBC: 4.56 MIL/uL (ref 3.87–5.11)
RDW: 12.8 % (ref 11.5–15.5)
WBC: 7.7 10*3/uL (ref 4.0–10.5)
nRBC: 0 % (ref 0.0–0.2)

## 2019-06-24 LAB — URINALYSIS, ROUTINE W REFLEX MICROSCOPIC
Bilirubin Urine: NEGATIVE
Glucose, UA: NEGATIVE mg/dL
Hgb urine dipstick: NEGATIVE
Ketones, ur: NEGATIVE mg/dL
Leukocytes,Ua: NEGATIVE
Nitrite: NEGATIVE
Protein, ur: NEGATIVE mg/dL
Specific Gravity, Urine: 1.008 (ref 1.005–1.030)
pH: 6 (ref 5.0–8.0)

## 2019-06-24 LAB — CBG MONITORING, ED: Glucose-Capillary: 132 mg/dL — ABNORMAL HIGH (ref 70–99)

## 2019-06-24 LAB — ETHANOL: Alcohol, Ethyl (B): <10 mg/dL

## 2019-06-24 MED ORDER — SODIUM CHLORIDE 0.9 % IV BOLUS
1000.0000 mL | Freq: Once | INTRAVENOUS | Status: AC
  Administered 2019-06-24: 1000 mL via INTRAVENOUS

## 2019-06-24 MED ORDER — SODIUM CHLORIDE 0.9% FLUSH
3.0000 mL | Freq: Once | INTRAVENOUS | Status: AC
  Administered 2019-06-24: 3 mL via INTRAVENOUS

## 2019-06-24 MED ORDER — SODIUM CHLORIDE 0.9 % IV BOLUS
500.0000 mL | Freq: Once | INTRAVENOUS | Status: AC
  Administered 2019-06-24: 500 mL via INTRAVENOUS

## 2019-06-24 NOTE — ED Provider Notes (Signed)
Care handoff received from [facility] PA-C at shift change, please see previous provider's note for full details.  In short 64 year old female arrives for evaluation of lightheadedness and generalized weakness that began 4 AM this morning as she got out of bed to use the bathroom.  Symptoms resolved with laying down, denies room spinning sensations.  Skin with decreased turgor.  Neuro examination unremarkable, gait shuffling lightheaded upon standing.  Orthostatics negative but appears dehydrated.  Plan of care is to follow-up on blood work, EKG and monitor after IV fluids, ambulate anticipate discharge.  Physical Exam  BP 131/73   Pulse 63   Temp 98 F (36.7 C) (Oral)   Resp 17   Ht 5\' 3"  (1.6 m)   Wt 70.3 kg   SpO2 98%   BMI 27.46 kg/m   Physical Exam Constitutional:      General: She is not in acute distress.    Appearance: Normal appearance. She is well-developed. She is not ill-appearing or diaphoretic.  HENT:     Head: Normocephalic and atraumatic.     Right Ear: External ear normal.     Left Ear: External ear normal.     Nose: Nose normal.  Eyes:     General: Vision grossly intact. Gaze aligned appropriately.     Pupils: Pupils are equal, round, and reactive to light.  Neck:     Trachea: Trachea and phonation normal. No tracheal deviation.  Pulmonary:     Effort: Pulmonary effort is normal. No respiratory distress.  Abdominal:     General: There is no distension.     Palpations: Abdomen is soft.     Tenderness: There is no abdominal tenderness. There is no guarding or rebound.  Musculoskeletal:        General: Normal range of motion.     Cervical back: Normal range of motion.  Skin:    General: Skin is warm and dry.  Neurological:     Mental Status: She is alert.     GCS: GCS eye subscore is 4. GCS verbal subscore is 5. GCS motor subscore is 6.     Comments: Speech is clear and goal oriented, follows commands Major Cranial nerves without deficit, no facial  droop Moves extremities without ataxia, coordination intact  Psychiatric:        Behavior: Behavior normal.     ED Course/Procedures     Procedures  MDM  7:15 AM I evaluated patient she is sleeping on her right side easily arousable to voice.  Reports that she is feeling somewhat improved.  She pulled herself to a sitting position without assistance, no return of lightheadedness.  Blood work is pending, EMS IV fluids are completed.  Another 1 L saline bolus is ordered.  We will continue to monitor.  Patient reports to me that she had "3 vodka cranberries" last night. - CBC within normal limits no evidence of anemia or leukocytosis to suggest infection.  BMP shows glucose 131 otherwise within normal limits no emergent electrolyte derangement, evidence of kidney injury or gap.  CBG 132. - Patient seen and evaluated by Dr. Jeanell Sparrow, additional 528ml fluid bolus ordered.  EKG: Sinus rhythm Confirmed by Pattricia Boss [phone]) on 06/24/2019 8:30:40 AM - Ethanol negative, patient does not appear intoxicated or in withdrawal.  Urinalysis straw-colored otherwise within normal limits, no evidence of infection. - Patient was ambulated around the emergency department by nursing staff, reports resolution of her symptoms and is requesting to be discharged.  On reevaluation patient  sitting up comfortably speaking with her husband in no acute distress.  Discussed potential of dehydration today and for patient to increase her water intake at home and she states understanding.  I also discussed with her that her blood pressure medication may be contributing somewhat to her dehydration as it is a recent medication change, I advised her to discuss this medication with her primary care provider at her follow-up visit this week prior to making any medication changes.  No evidence of CVA or other emergent pathologies at this time   At this time there does not appear to be any evidence of an acute emergency medical  condition and the patient appears stable for discharge with appropriate outpatient follow up. Diagnosis was discussed with patient who verbalizes understanding of care plan and is agreeable to discharge. I have discussed return precautions with patient who verbalizes understanding of return precautions. Patient encouraged to follow-up with their PCP. All questions answered.  Patient seen and evaluated by Dr. Jeanell Sparrow during this visit who agrees with discharge and PCP follow-up.  Note: Portions of this report may have been transcribed using voice recognition software. Every effort was made to ensure accuracy; however, inadvertent computerized transcription errors may still be present.   Gari Crown 06/24/19 1209    Pattricia Boss, MD 06/25/19 1137

## 2019-06-24 NOTE — ED Provider Notes (Signed)
[place] EMERGENCY DEPARTMENT Provider Note   CSN: [id number] Arrival date & time: 06/24/19  0535     History Chief Complaint  Patient presents with  . Weakness  . Dizziness    Aliceia Lamonica is a 64 y.o. female with history of GERD, hypertension, migraine headaches, osteoarthritis, depression presents for evaluation of acute onset, intermittent lightheadedness noted at 4 AM this morning.  She reports that the symptoms began as she got up out of bed to go to the bathroom at 4 AM.  She reports feeling very lightheaded as though she may lose consciousness and feeling unsteady with ambulation.  She reports that the symptoms completely resolve with rest but resume again with standing.  She denies room spinning sensation, headaches, vision changes, numbness or weakness of the extremities, chest pain, shortness of breath, abdominal pain, nausea, or vomiting.  She is a current smoker, denies recreational drug use or excessive alcohol intake.  She has not tried anything for her symptoms.  She does tell me that she feels as though she may be dehydrated.  She denies fever, cough, or urinary symptoms. She began to receive 500cc fluid bolus with EMS.   The history is provided by the patient.       Past Medical History:  Diagnosis Date  . Bilateral foot pain    morton neuroma, seeing podiatrist  . CTS (carpal tunnel syndrome)    has seen hand specilist  . Depression 03/24/2007   Qualifier: Diagnosis of  By: Arnoldo Morale MD, Balinda Quails   . Gastric ulcer    per patient  . GERD (gastroesophageal reflux disease) 05/27/2014   Sees Dr. Earlean Shawl; hx erosive esophagitis  . Hot flashes    recur when stops HRT  . HTN (hypertension)   . Low back pain    chronic  . Migraines    resolved on HRT for hot flases  . Muscle spasm    very active, muscle spasm after tennis matches    Patient Active Problem List   Diagnosis Date Noted  . Osteoarthritis, knee 03/07/2017  . Family history of  premature CAD 03/07/2017  . Atypical chest pain 11/25/2014  . Exertional shortness of breath 11/25/2014  . GERD (gastroesophageal reflux disease) 05/27/2014  . Essential hypertension 05/20/2007  . Depression 03/24/2007  . SYMPTOMATIC MENOPAUSAL/FEMALE CLIMACTERIC STATES 03/24/2007  . ALLERGIC RHINITIS 09/30/2006    Past Surgical History:  Procedure Laterality Date  . CARPAL TUNNEL RELEASE     right hand  . MEDIAL PARTIAL KNEE REPLACEMENT  2017   Right knee  . TONSILLECTOMY       OB History   No obstetric history on file.     Family History  Problem Relation Age of Onset  . Charcot-Marie-Tooth disease Father   . Renal cancer Mother   . Schizophrenia Brother   . Hypertension Other     Social History   Tobacco Use  . Smoking status: Current Every Day Smoker    Packs/day: 0.50    Years: 2.00    Pack years: 1.00    Types: Cigarettes  . Smokeless tobacco: Never Used  Substance Use Topics  . Alcohol use: Yes    Comment: several drinks every day  . Drug use: No    Home Medications Prior to Admission medications   Medication Sig Start Date End Date Taking? Authorizing Provider  aspirin 81 MG chewable tablet Chew by mouth daily.    [provider]  bisoprolol-hydrochlorothiazide (ZIAC) 2.5-6.25 MG tablet  Take 1 tablet by mouth daily. 05/18/19   Burchette, Alinda Sierras, MD  citalopram (CELEXA) 20 MG tablet TAKE 1 TABLET BY MOUTH EVERY DAY 06/18/19   Burchette, Alinda Sierras, MD  cyclobenzaprine (FLEXERIL) 10 MG tablet Take 10 mg by mouth 3 (three) times daily as needed for muscle spasms.    [provider]  fluorometholone (FML) 0.1 % ophthalmic suspension Place 1 drop into both eyes 4 (four) times daily. 10/03/17   [provider]  gabapentin (NEURONTIN) 100 MG capsule gabapentin 100 mg capsule  TAKE 1 CAPSULE BY MOUTH EVERYDAY AT BEDTIME    [provider]  meloxicam (MOBIC) 15 MG tablet Take 15 mg by mouth daily.    [provider]    Menthol-Methyl Salicylate (GRX ANALGESIC BALM) OINT Apply 1 application topically as needed (pain).    [provider]  naproxen sodium (ALEVE) 220 MG tablet Take 220 mg by mouth as needed (pain).    [provider]  omeprazole (PRILOSEC) 40 MG capsule Take 40 mg by mouth daily. 10/29/14   [provider]  Turmeric 400 MG CAPS Take 1 capsule by mouth daily.    [provider]    Allergies    Penicillins  Review of Systems   Review of Systems  Constitutional: Negative for chills and fever.  Eyes: Negative for photophobia and visual disturbance.  Respiratory: Negative for shortness of breath.   Cardiovascular: Negative for chest pain.  Gastrointestinal: Negative for abdominal pain, nausea and vomiting.  Neurological: Positive for weakness (generalized) and light-headedness. Negative for numbness and headaches.  All other systems reviewed and are negative.   Physical Exam Updated Vital Signs BP 131/73   Pulse 63   Temp 98 F (36.7 C) (Oral)   Resp 17   Ht 5\' 3"  (1.6 m)   Wt 70.3 kg   SpO2 98%   BMI 27.46 kg/m   Physical Exam Vitals and nursing note reviewed.  Constitutional:      General: She is not in acute distress.    Appearance: She is well-developed.  HENT:     Head: Normocephalic and atraumatic.     Mouth/Throat:     Mouth: Mucous membranes are dry.  Eyes:     General:        Right eye: No discharge.        Left eye: No discharge.     Extraocular Movements: Extraocular movements intact.     Conjunctiva/sclera: Conjunctivae normal.     Pupils: Pupils are equal, round, and reactive to light.  Neck:     Vascular: No JVD.     Trachea: No tracheal deviation.  Cardiovascular:     Rate and Rhythm: Normal rate and regular rhythm.  Pulmonary:     Effort: Pulmonary effort is normal.     Breath sounds: Normal breath sounds.  Abdominal:     General: Bowel sounds are normal. There is no distension.     Palpations: Abdomen is soft.      Tenderness: There is no abdominal tenderness. There is no guarding or rebound.  Musculoskeletal:        General: Normal range of motion.     Cervical back: Neck supple.  Skin:    General: Skin is warm.     Findings: No erythema.     Comments: decreased skin turgor  Neurological:     Mental Status: She is alert and oriented to person, place, and time.     Cranial Nerves: No cranial  nerve deficit.     Sensory: No sensory deficit.     Motor: No weakness.     Comments: Mental Status:  Alert, thought content appropriate, able to give a coherent history. Speech fluent without evidence of aphasia. Able to follow 2 step commands without difficulty.  Cranial Nerves:  II:  Peripheral visual fields grossly normal, pupils equal, round, reactive to light III,IV, VI: ptosis not present, extra-ocular motions intact bilaterally  V,VII: smile symmetric, facial light touch sensation equal VIII: hearing grossly normal to voice  X: uvula elevates symmetrically  XI: bilateral shoulder shrug symmetric and strong XII: midline tongue extension without fassiculations Motor:  Normal tone. 5/5 strength of BUE and BLE major muscle groups including strong and equal grip strength and dorsiflexion/plantar flexion Sensory: light touch normal in all extremities. Cerebellar: normal finger-to-nose with bilateral upper extremities, Unable to tolerate Romberg's test due to lightheadedness. Gait: Becomes lightheaded upon standing. Ambulates with shuffling, slightly unsteady gait.    Psychiatric:        Behavior: Behavior normal.     ED Results / Procedures / Treatments   Labs (all labs ordered are listed, but only abnormal results are displayed) Labs Reviewed  CBG MONITORING, ED - Abnormal; Notable for the following components:      Result Value   Glucose-Capillary 132 (*)    All other components within normal limits  BASIC METABOLIC PANEL  URINALYSIS, ROUTINE W REFLEX MICROSCOPIC  CBC WITH  DIFFERENTIAL/PLATELET  ETHANOL    EKG None  Radiology No results found.  Procedures Procedures (including critical care time)  Medications Ordered in ED Medications  sodium chloride flush (NS) 0.9 % injection 3 mL (3 mLs Intravenous Not Given 06/24/19 0607)  sodium chloride 0.9 % bolus 1,000 mL (has no administration in time range)    ED Course  I have reviewed the triage vital signs and the nursing notes.  Pertinent labs & imaging results that were available during my care of the patient were reviewed by me and considered in my medical decision making (see chart for details).    MDM Rules/Calculators/A&P                      Patient presenting for evaluation of lightheadedness with standing that began at 4 AM this morning upon awakening to go to the bathroom.  She is afebrile, vital signs are stable.  She is nontoxic in appearance.  No focal neurologic deficits.  She is ambulatory in the ED but becomes lightheaded with standing.  She has no vertigo or room spinning sensation.  No nystagmus on evaluation.  Doubt BPPV, posterior circulation stroke, or central cause of her symptoms.  She has no headache, chest pain, or endings concerning for ACS/MI or CVA.  Her EKG shows normal sinus rhythm, no acute ischemic abnormalities.  She is not hypoglycemic.  We will obtain blood work and UA and reassess.  She is not orthostatic but clinically appears dry, will give IV fluids in the ED.  7:06 AM Signed out to oncoming provider PA [facility]. Pending labwork, UA, and reassessment. If workup is reassuring and she is clinically improved after IV fluids and ambulating more steadily, she will likely be stable for discharge home with outpatient follow up with PCP.   Final Clinical Impression(s) / ED Diagnoses Final diagnoses:  Positional lightheadedness    Rx / DC Orders ED Discharge Orders    None       Renita Papa, PA-C 06/24/19 [id number]  Merryl Hacker, MD 06/24/19 1540

## 2019-06-24 NOTE — ED Triage Notes (Signed)
Pt comes from home via [facility] EMS for weakness and dizziness that started at 4am after getting up to go to the bathroom. Neuro intact bilaterally.

## 2019-06-24 NOTE — ED Notes (Signed)
Pt ambulated to bathroom.  Denied dizziness.  Ambulated without difficulty.  VS stable.  PA notified.  Tolerating PO.

## 2019-06-24 NOTE — ED Notes (Signed)
Accidentally click saline lock IV did not complete

## 2019-06-24 NOTE — Discharge Instructions (Signed)
You have been diagnosed today with Positional Lightheadedness and Dehydration.  At this time there does not appear to be the presence of an emergent medical condition, however there is always the potential for conditions to change. Please read and follow the below instructions.  Please return to the Emergency Department immediately for any new or worsening symptoms. Please be sure to follow up with your Primary Care Provider within one week regarding your visit today; please call their office to schedule an appointment even if you are feeling better for a follow-up visit. It is possible that your symptoms today may be due to some degree of dehydration.  Please be sure to drink enough water to avoid dehydration.  Alcohol may also cause your to be dehydrated.  Additionally your new blood pressure medication may contribute to your symptoms today, discuss your blood pressure medication with your primary care provider at your follow-up visit.  Do not change your blood pressure medication until you speak with your primary care provider.  Get help right away if: You throw up (vomit) or have watery poop (diarrhea), and you cannot eat or drink anything. You have trouble: Talking. Walking. Swallowing. Using your arms, hands, or legs. You feel generally weak. You are not thinking clearly, or you have trouble forming sentences. A friend or family member may notice this. You have: Chest pain. Pain in your belly (abdomen). Shortness of breath. Sweating. Your vision changes. You are bleeding. You have a very bad headache. You have neck pain or a stiff neck. You have a fever. You have any new/concerning or worsening of symptoms  Please read the additional information packets attached to your discharge summary.  Do not take your medicine if  develop an itchy rash, swelling in your mouth or lips, or difficulty breathing; call 911 and seek immediate emergency medical attention if this occurs.  Note:  Portions of this text may have been transcribed using voice recognition software. Every effort was made to ensure accuracy; however, inadvertent computerized transcription errors may still be present.

## 2019-06-24 NOTE — ED Provider Notes (Signed)
64 year old female reports that she felt shaking in all of her extremities when she woke up at 4 AM.  She felt generally weak and had difficulty walking.  She denies any lateralized weakness, numbness, tingling, visual changes, or speech difficulty.  Here she feels somewhat improved after 1 L of normal saline. ED ECG REPORT     EKG Interpretation  Date/Time:  Wednesday June 24 2019 06:15:35 EDT Ventricular Rate:  64 PR Interval:    QRS Duration: 98 QT Interval:  423 QTC Calculation: 437 R Axis:   80 Text Interpretation: Sinus rhythm Confirmed by Pattricia Boss [phone]) on 06/24/2019 8:30:40 AM      Labs reviewed EKG reviewed and appears normal with unchanged from prior Well-developed well-nourished female lying in bed does not appear to be in any acute distress vital signs are stable Vitals:   06/24/19 0548  BP: 131/73  Pulse: 63  Resp: 17  Temp: 98 F (36.7 C)  SpO2: 98%   Patient has been unable to void at this time Plan 500 cc normal saline and p.o. challenge.  We will then ambulate her and reassess for  probable discharge   Pattricia Boss, MD 06/25/19 1137

## 2019-06-25 ENCOUNTER — Other Ambulatory Visit: Payer: Self-pay

## 2019-06-26 ENCOUNTER — Ambulatory Visit (INDEPENDENT_AMBULATORY_CARE_PROVIDER_SITE_OTHER): Payer: Medicare Other | Admitting: Family Medicine

## 2019-06-26 VITALS — BP 116/62 | HR 65 | Temp 97.6°F | Wt 161.0 lb

## 2019-06-26 DIAGNOSIS — R5383 Other fatigue: Secondary | ICD-10-CM

## 2019-06-26 DIAGNOSIS — R252 Cramp and spasm: Secondary | ICD-10-CM | POA: Diagnosis not present

## 2019-06-26 DIAGNOSIS — R42 Dizziness and giddiness: Secondary | ICD-10-CM | POA: Diagnosis not present

## 2019-06-26 DIAGNOSIS — R202 Paresthesia of skin: Secondary | ICD-10-CM | POA: Diagnosis not present

## 2019-06-26 NOTE — Progress Notes (Signed)
Subjective:     Patient ID: Debra Trevino, female   DOB: 1955-12-17, 64 y.o.   MRN: [id number]  HPI Debra Trevino  is seen for ER follow-up.  She was seen there early Wednesday morning with some lightheadedness and generalized weakness that she first noted around 4 AM when she got up out of bed to go to the bathroom.  When she was on her way to the bathroom she noted that both her legs felt somewhat "shaky "and weak.  There was no syncope.  Her symptoms improved after she laid back down.  There was concern in the ER that she was somewhat dehydrated though she was not orthostatic.  She did seem to improve some after IV fluids.  Labs from ER reviewed.  Her electrolytes were normal.  CBC normal.  Ethanol level less than 10.  Urinalysis was clear.  EKG showed no acute changes.  She denied any speech changes or any focal weakness.  She does take low-dose Ziac for hypertension.  Does not report any consistent orthostatic symptoms at this time.  She states he drinks about 3 ounces of vodka per night and has been doing so for some time.  She states that she has recently had some tingling and burning sensation in her feet and legs.  No history of diabetes.  Recent A1c 6.0%.  She does also take omeprazole for reflux symptoms and has been on this for some time.  No recent B12 or thyroid levels. She also relates concern for some possible mild cognitive slowing.  No headaches.  No recent head injury  She does also report some intermittent muscle cramping.  Recent potassium normal.  Past Medical History:  Diagnosis Date  . Bilateral foot pain    morton neuroma, seeing podiatrist  . CTS (carpal tunnel syndrome)    has seen hand specilist  . Depression 03/24/2007   Qualifier: Diagnosis of  By: Arnoldo Morale MD, Balinda Quails   . Gastric ulcer    per patient  . GERD (gastroesophageal reflux disease) 05/27/2014   Sees Dr. Earlean Shawl; hx erosive esophagitis  . Hot flashes    recur when stops HRT  . HTN (hypertension)   . Low  back pain    chronic  . Migraines    resolved on HRT for hot flases  . Muscle spasm    very active, muscle spasm after tennis matches   Past Surgical History:  Procedure Laterality Date  . CARPAL TUNNEL RELEASE     right hand  . MEDIAL PARTIAL KNEE REPLACEMENT  2017   Right knee  . TONSILLECTOMY      reports that she has been smoking cigarettes. She has a 1.00 pack-year smoking history. She has never used smokeless tobacco. She reports current alcohol use. She reports that she does not use drugs. family history includes Charcot-Marie-Tooth disease in her father; Hypertension in an other family member; Renal cancer in her mother; Schizophrenia in her brother. Allergies  Allergen Reactions  . Penicillins Swelling    Has patient had a PCN reaction causing immediate rash, facial/tongue/throat swelling, SOB or lightheadedness with hypotension:YES Has patient had a PCN reaction causing severe rash involving mucus membranes or skin necrosis: NO Has patient had a PCN reaction that required hospitalization NO Has patient had a PCN reaction occurring within the last 10 years: NO If all of the above answers are "NO", then may proceed with Cephalosporin use.     Review of Systems  Constitutional: Negative for appetite change, chills, fatigue,  fever and unexpected weight change.  Respiratory: Negative for cough and shortness of breath.   Cardiovascular: Negative for chest pain, palpitations and leg swelling.  Gastrointestinal: Negative for abdominal pain, nausea and vomiting.  Genitourinary: Negative for dysuria.  Skin: Negative for rash.  Neurological: Positive for dizziness. Negative for weakness and numbness.  Hematological: Negative for adenopathy.       Objective:   Physical Exam Vitals reviewed.  Constitutional:      Appearance: Normal appearance.  Cardiovascular:     Rate and Rhythm: Normal rate and regular rhythm.  Pulmonary:     Effort: Pulmonary effort is normal.      Breath sounds: Normal breath sounds.  Musculoskeletal:     Right lower leg: No edema.     Left lower leg: No edema.  Neurological:     General: No focal deficit present.     Mental Status: She is alert.     Cranial Nerves: No cranial nerve deficit.     Motor: No weakness.     Coordination: Coordination normal.  Psychiatric:        Mood and Affect: Mood normal.        Assessment:     #1 recent dizziness.  She does not have any orthostatic changes today.  Blood pressure both seated and standing was 138/78.  May have had some mild volume depletion when she had dizziness on presentation to ER.  Her labs were unremarkable  #2 paresthesias/dysesthesias involving lower extremities.  In view of question of some mild cognitive slowing we will check TSH and B12 especially with her chronic PPI use.  #3 intermittent muscle cramps    Plan:     -Stressed importance of adequate hydration -Check further labs including TSH, B12, magnesium level -We discussed gradually scaling back her alcohol consumption-though not clear this had anything to do with her symptoms above.  Eulas Post MD [facility]

## 2019-06-26 NOTE — Patient Instructions (Signed)
Paresthesia Paresthesia is an abnormal burning or prickling sensation. It is usually felt in the hands, arms, legs, or feet. However, it may occur in any part of the body. Usually, paresthesia is not painful. It may feel like:  Tingling or numbness.  Buzzing.  Itching. Paresthesia may occur without any clear cause, or it may be caused by:  Breathing too quickly (hyperventilation).  Pressure on a nerve.  An underlying medical condition.  Side effects of a medication.  Nutritional deficiencies.  Exposure to toxic chemicals. Most people experience temporary (transient) paresthesia at some time in their lives. For some people, it may be long-lasting (chronic) because of an underlying medical condition. If you have paresthesia that lasts a long time, you may need to be evaluated by your health care provider. Follow these instructions at home: Alcohol use   Do not drink alcohol if: ? Your health care provider tells you not to drink. ? You are pregnant, may be pregnant, or are planning to become pregnant.  If you drink alcohol: ? Limit how much you use to:  0-1 drink a day for women.  0-2 drinks a day for men. ? Be aware of how much alcohol is in your drink. In the [place] one drink equals one 12 oz bottle of beer (355 mL), one 5 oz glass of wine (148 mL), or one 1 oz glass of hard liquor (44 mL). Nutrition   Eat a healthy diet. This includes: ? Eating foods that are high in fiber, such as fresh fruits and vegetables, whole grains, and beans. ? Limiting foods that are high in fat and processed sugars, such as fried or sweet foods. General instructions  Take over-the-counter and prescription medicines only as told by your health care provider.  Do not use any products that contain nicotine or tobacco, such as cigarettes and e-cigarettes. These can keep blood from reaching damaged nerves. If you need help quitting, ask your health care provider.  If you have diabetes, work  closely with your health care provider to keep your blood sugar under control.  If you have numbness in your feet: ? Check every day for signs of injury or infection. Watch for redness, warmth, and swelling. ? Wear padded socks and comfortable shoes. These help protect your feet.  Keep all follow-up visits as told by your health care provider. This is important. Contact a health care provider if you:  Have paresthesia that gets worse or does not go away.  Have a burning or prickling feeling that gets worse when you walk.  Have pain, cramps, or dizziness.  Develop a rash. Get help right away if you:  Feel weak.  Have trouble walking or moving.  Have problems with speech, understanding, or vision.  Feel confused.  Cannot control your bladder or bowel movements.  Have numbness after an injury.  Develop new weakness in an arm or leg.  Faint. Summary  Paresthesia is an abnormal burning or prickling sensation that is usually felt in the hands, arms, legs, or feet. It may also occur in other parts of the body.  Paresthesia may occur without any clear cause, or it may be caused by breathing too quickly (hyperventilation), pressure on a nerve, an underlying medical condition, side effects of a medication, nutritional deficiencies, or exposure to toxic chemicals.  If you have paresthesia that lasts a long time, you may need to be evaluated by your health care provider. This information is not intended to replace advice given to you by   your health care provider. Make sure you discuss any questions you have with your health care provider. Document Revised: 03/24/2018 Document Reviewed: 03/07/2017 Elsevier Patient Education  2020 [employer]vier [employer].  

## 2019-06-27 LAB — TSH: TSH: 1.5 mIU/L (ref 0.40–4.50)

## 2019-06-27 LAB — VITAMIN B12: Vitamin B-12: 592 pg/mL (ref 200–1100)

## 2019-06-27 LAB — MAGNESIUM: Magnesium: 2.3 mg/dL (ref 1.5–2.5)

## 2019-07-31 ENCOUNTER — Telehealth: Payer: Self-pay | Admitting: Acute Care

## 2019-07-31 DIAGNOSIS — Z87891 Personal history of nicotine dependence: Secondary | ICD-10-CM

## 2019-07-31 DIAGNOSIS — F17210 Nicotine dependence, cigarettes, uncomplicated: Secondary | ICD-10-CM

## 2019-08-03 NOTE — Telephone Encounter (Signed)
LMTC x 1  

## 2019-08-06 NOTE — Telephone Encounter (Signed)
LMTC x 1  

## 2019-08-07 NOTE — Telephone Encounter (Signed)
Patient is returning phone call. Patient phone number is [phone].

## 2019-08-11 NOTE — Telephone Encounter (Signed)
Spoke with pt and scheduled SDMV 08/26/19 12:00 CT ordered Nothing further needed

## 2019-08-21 ENCOUNTER — Telehealth: Payer: Self-pay | Admitting: Acute Care

## 2019-08-25 NOTE — Telephone Encounter (Signed)
Spoke with pt and rescheduled SDMV to 08/26/19 11:30. Pt will keep appt for LDCT. Nothing further needed.

## 2019-08-26 ENCOUNTER — Encounter: Payer: Self-pay | Admitting: Acute Care

## 2019-08-26 ENCOUNTER — Encounter: Payer: Medicare Other | Admitting: Acute Care

## 2019-08-26 ENCOUNTER — Ambulatory Visit (INDEPENDENT_AMBULATORY_CARE_PROVIDER_SITE_OTHER): Payer: Medicare Other | Admitting: Acute Care

## 2019-08-26 ENCOUNTER — Ambulatory Visit
Admission: RE | Admit: 2019-08-26 | Discharge: 2019-08-26 | Disposition: A | Discharge status: Home / Self Care | Payer: Medicare Other | Source: Ambulatory Visit | Attending: Acute Care | Admitting: Acute Care

## 2019-08-26 ENCOUNTER — Other Ambulatory Visit: Payer: Self-pay

## 2019-08-26 DIAGNOSIS — Z122 Encounter for screening for malignant neoplasm of respiratory organs: Secondary | ICD-10-CM

## 2019-08-26 DIAGNOSIS — F17210 Nicotine dependence, cigarettes, uncomplicated: Secondary | ICD-10-CM | POA: Diagnosis not present

## 2019-08-26 DIAGNOSIS — Z87891 Personal history of nicotine dependence: Secondary | ICD-10-CM

## 2019-08-26 NOTE — Progress Notes (Signed)
Shared Decision Making Visit Lung Cancer Screening Program [phone])   Eligibility:  Age 64 y.o.  Pack Years Smoking History Calculation  (# packs/per year x # years smoked)  Recent History of coughing up blood  no  Unexplained weight loss? no ( >Than 15 pounds within the last 6 months )  Prior History Lung / other cancer no (Diagnosis within the last 5 years already requiring surveillance chest CT Scans).  Smoking Status Current Smoker  Former Smokers: Years since quit: NA  Quit Date: NA  Visit Components:  Discussion included one or more decision making aids. yes  Discussion included risk/benefits of screening. yes  Discussion included potential follow up diagnostic testing for abnormal scans. yes  Discussion included meaning and risk of over diagnosis. yes  Discussion included meaning and risk of False Positives. yes  Discussion included meaning of total radiation exposure. yes  Counseling Included:  Importance of adherence to annual lung cancer LDCT screening. yes  Impact of comorbidities on ability to participate in the program. yes  Ability and willingness to under diagnostic treatment. yes  Smoking Cessation Counseling:  Current Smokers:   Discussed importance of smoking cessation. yes  Information about tobacco cessation classes and interventions provided to patient. yes  Patient provided with "ticket" for LDCT Scan. yes  Symptomatic Patient. no  Counseling  Diagnosis Code: Tobacco Use Z72.0  Asymptomatic Patient yes  Counseling (Intermediate counseling: > three minutes counseling) Q6834  Former Smokers:   Discussed the importance of maintaining cigarette abstinence. yes  Diagnosis Code: Personal History of Nicotine Dependence. H96.222  Information about tobacco cessation classes and interventions provided to patient. Yes  Patient provided with "ticket" for LDCT Scan. yes  Written Order for Lung Cancer Screening with LDCT placed in Epic.  Yes (CT Chest Lung Cancer Screening Low Dose W/O CM) [id number] Z12.2-Screening of respiratory organs Z87.891-Personal history of nicotine dependence  I have spent 25 minutes of face to face time with Ms. Depoy discussing the risks and benefits of lung cancer screening. We viewed a power point together that explained in detail the above noted topics. We paused at intervals to allow for questions to be asked and answered to ensure understanding.We discussed that the single most powerful action that she can take to decrease her risk of developing lung cancer is to quit smoking. We discussed whether or not she is ready to commit to setting a quit date. We discussed options for tools to aid in quitting smoking including nicotine replacement therapy, non-nicotine medications, support groups, Quit Smart classes, and behavior modification. We discussed that often times setting smaller, more achievable goals, such as eliminating 1 cigarette a day for a week and then 2 cigarettes a day for a week can be helpful in slowly decreasing the number of cigarettes smoked. This allows for a sense of accomplishment as well as providing a clinical benefit. I gave her the " Be Stronger Than Your Excuses" card with contact information for community resources, classes, free nicotine replacement therapy, and access to mobile apps, text messaging, and on-line smoking cessation help. I have also given her my card and contact information in the event she needs to contact me. We discussed the time and location of the scan, and that either Doroteo Glassman RN or I will call with the results within 24-48 hours of receiving them. I have offered her  a copy of the power point we viewed  as a resource in the event they need reinforcement of the concepts we discussed  today in the office. The patient verbalized understanding of all of  the above and had no further questions upon leaving the office. They have my contact information in the event they  have any further questions.  I spent 3 minutes counseling on smoking cessation and the health risks of continued tobacco abuse.  I explained to the patient that there has been a high incidence of coronary artery disease noted on these exams. I explained that this is a non-gated exam therefore degree or severity cannot be determined. This patient is not on statin therapy. I have asked the patient to follow-up with their PCP regarding any incidental finding of coronary artery disease and management with diet or medication as their PCP  feels is clinically indicated. The patient verbalized understanding of the above and had no further questions upon completion of the visit.     Magdalen Spatz, NP 08/26/2019

## 2019-08-27 NOTE — Progress Notes (Signed)
Please call patient and let them  know their  low dose Ct was read as a Lung RADS 1, negative study: no nodules or definitely benign nodules. Radiology recommendation is for a repeat LDCT in 12 months. .Please let them  know we will order and schedule their  annual screening scan for 08/2020.  Please place order for annual  screening scan for  08/2020 and fax results to PCP. Thanks so much.  Please let patient know there was notation of Hepatic steatosis. This  is a term that describes the build up of fat in the liver. It is normal to have small amounts of fat in your liver, but when the proportion of liver cells that contain fat exceeds more than 5% it is indicative of early stage fatty liver.Treatment often involves reducing risk factors through a diet and exercise plan. It is generally a benign condition, but in a small percentage of patients it does require follow up. Please have the patient follow up with PCP regarding potential risk factor modification, dietary therapy or pharmacologic therapy if clinically indicated.

## 2019-08-28 ENCOUNTER — Other Ambulatory Visit: Payer: Self-pay | Admitting: *Deleted

## 2019-08-28 DIAGNOSIS — Z87891 Personal history of nicotine dependence: Secondary | ICD-10-CM

## 2019-08-28 DIAGNOSIS — F17210 Nicotine dependence, cigarettes, uncomplicated: Secondary | ICD-10-CM

## 2019-12-07 ENCOUNTER — Other Ambulatory Visit: Payer: Self-pay | Admitting: Family Medicine

## 2020-01-01 ENCOUNTER — Telehealth: Payer: Self-pay | Admitting: Acute Care

## 2020-01-01 NOTE — Telephone Encounter (Signed)
Spoke with Debra Trevino regarding low dose ct that was done back in 08/2019. Debra Trevino has questions about degree of Emphysema and requested a visit with Eric Form, NP to discuss. Appt made for 01/11/20 10:00 televisit.

## 2020-01-05 ENCOUNTER — Encounter: Payer: Self-pay | Admitting: Family Medicine

## 2020-01-05 LAB — HM MAMMOGRAPHY

## 2020-01-11 ENCOUNTER — Other Ambulatory Visit: Payer: Self-pay

## 2020-01-11 ENCOUNTER — Ambulatory Visit (INDEPENDENT_AMBULATORY_CARE_PROVIDER_SITE_OTHER): Payer: Medicare Other | Admitting: Acute Care

## 2020-01-11 ENCOUNTER — Encounter: Payer: Self-pay | Admitting: Acute Care

## 2020-01-11 DIAGNOSIS — R0600 Dyspnea, unspecified: Secondary | ICD-10-CM | POA: Diagnosis not present

## 2020-01-11 DIAGNOSIS — Z122 Encounter for screening for malignant neoplasm of respiratory organs: Secondary | ICD-10-CM | POA: Diagnosis not present

## 2020-01-11 DIAGNOSIS — J439 Emphysema, unspecified: Secondary | ICD-10-CM

## 2020-01-11 DIAGNOSIS — R0609 Other forms of dyspnea: Secondary | ICD-10-CM

## 2020-01-11 DIAGNOSIS — F17210 Nicotine dependence, cigarettes, uncomplicated: Secondary | ICD-10-CM | POA: Diagnosis not present

## 2020-01-11 NOTE — Progress Notes (Signed)
Virtual Visit via Telephone Note  I connected with Debra Trevino on 01/11/20 at 10:00 AM EDT by telephone and verified that I am speaking with the correct person using two identifiers.  Location: Patient: At home Provider: [address], [place], [place], [address]   I discussed the limitations, risks, security and privacy concerns of performing an evaluation and management service by telephone and the availability of in person appointments. I also discussed with the patient that there may be a patient responsible charge related to this service. The patient expressed understanding and agreed to proceed.  History of Present Illness: Pt presents for follow up of her low dose CT. Her result was read as a Lung RADS 1, negative study: no nodules or definitely benign nodules. Radiology recommendation is for a repeat LDCT in 12 months. We reviewed her results. She verbalized understanding.  She is a current every day smoker with a 50 + pack year smoking history. She saw on her result that she has mild emphysema. She is interested in working on quitting smoking and referral to see if she has COPD. She has noticed that she has increased shortness of breath with minimal exertion. Worse when she is climbing stairs. She does not use any inhalers. She is continuing to smoke. She was counseled on quitting . She did not want an appointment with pharmacy, but would like to work on quitting herself. We discussed that the only way to diagnose COPD is with PFT's. She is agreeable to referral for pulmonary work up.   We discussed the finding of hepatic steatosis. I explained that Hepatic steatosis is a term that describes the build up of fat in the liver. It is normal to have small amounts of fat in your liver, but when the proportion of liver cells that contain fat exceeds more than 5% it is indicative of early stage fatty liver.Treatment often involves reducing risk factors through a diet and exercise plan.  It is generally a benign condition, but in a small percentage of patients it does require follow up. I have asked  the patient to  follow up with her  PCP regarding potential risk factor modification, dietary therapy or pharmacologic therapy if clinically indicated.She verbalized understanding.   She denies fever, chest pain, orthopnea or hemoptysis.     Observations/Objective: Low Dose CT for Lung Cancer Screening Cardiovascular: Normal aortic caliber. Normal heart size, without pericardial effusion.  Mediastinum/Nodes: No mediastinal or definite hilar adenopathy, given limitations of unenhanced CT.  Lungs/Pleura: No pleural fluid. Mild centrilobular emphysema. No suspicious pulmonary nodule or mass.  Upper Abdomen: Moderate hepatic steatosis. Normal imaged portions of the spleen, stomach, pancreas, gallbladder, adrenal glands, kidneys.  Musculoskeletal: No acute osseous abnormality.  IMPRESSION:  08/26/2019 Low Dose Ct 1. Lung-RADS 1, negative. Continue annual screening with low-dose chest CT without contrast in 12 months. 2. Hepatic steatosis. 3. Emphysema (ICD10-J43.9).  + dyspnea when speaking on the phone today  Assessment and Plan: Tobacco Abuse LDCT read as a LR 1 Mild Emphysema per CT Chest Plan Annual lung cancer screening 12/2020 Counseled about smoking cessation Reminded to call 1-800- QUIT Now for free nicotine patches gum or mints  Dyspnea of exertion Emphysema Concerned she has COPD No Inhaler use  Plan Referral to Pulmonary Medicine, first available for emphysema, DOE Consider PFT's , 6 minute walk, inhaler use  Follow Up Instructions: Pulmonary Consult for DOE with first available pulmonologist.    I discussed the assessment and treatment plan with the  patient. The patient was provided an opportunity to ask questions and all were answered. The patient agreed with the plan and demonstrated an understanding of the instructions.   The patient was  advised to call back or seek an in-person evaluation if the symptoms worsen or if the condition fails to improve as anticipated.  I provided 23 minutes of non-face-to-face time during this encounter.   Magdalen Spatz, NP 01/11/2020 10:51 AM

## 2020-01-13 ENCOUNTER — Encounter: Payer: Self-pay | Admitting: Family Medicine

## 2020-01-20 ENCOUNTER — Other Ambulatory Visit: Payer: Self-pay | Admitting: Family Medicine

## 2020-01-27 DIAGNOSIS — J439 Emphysema, unspecified: Secondary | ICD-10-CM

## 2020-01-27 DIAGNOSIS — R0600 Dyspnea, unspecified: Secondary | ICD-10-CM

## 2020-01-27 DIAGNOSIS — R0609 Other forms of dyspnea: Secondary | ICD-10-CM

## 2020-02-24 ENCOUNTER — Encounter: Payer: Self-pay | Admitting: Pulmonary Disease

## 2020-02-24 ENCOUNTER — Ambulatory Visit (INDEPENDENT_AMBULATORY_CARE_PROVIDER_SITE_OTHER): Payer: Medicare Other | Admitting: Pulmonary Disease

## 2020-02-24 ENCOUNTER — Other Ambulatory Visit: Payer: Self-pay

## 2020-02-24 VITALS — BP 120/68 | HR 63 | Temp 97.7°F | Ht 63.0 in | Wt 162.0 lb

## 2020-02-24 DIAGNOSIS — R0600 Dyspnea, unspecified: Secondary | ICD-10-CM

## 2020-02-24 DIAGNOSIS — J432 Centrilobular emphysema: Secondary | ICD-10-CM | POA: Diagnosis not present

## 2020-02-24 DIAGNOSIS — R0602 Shortness of breath: Secondary | ICD-10-CM | POA: Diagnosis not present

## 2020-02-24 DIAGNOSIS — Z716 Tobacco abuse counseling: Secondary | ICD-10-CM

## 2020-02-24 DIAGNOSIS — F17210 Nicotine dependence, cigarettes, uncomplicated: Secondary | ICD-10-CM

## 2020-02-24 DIAGNOSIS — F17200 Nicotine dependence, unspecified, uncomplicated: Secondary | ICD-10-CM

## 2020-02-24 DIAGNOSIS — R0609 Other forms of dyspnea: Secondary | ICD-10-CM

## 2020-02-24 MED ORDER — ANORO ELLIPTA 62.5-25 MCG/INH IN AEPB: 1.0000 | INHALATION_SPRAY | Freq: Every day | RESPIRATORY_TRACT | 0 refills | Status: DC

## 2020-02-24 MED ORDER — ALBUTEROL SULFATE HFA 108 (90 BASE) MCG/ACT IN AERS: 2.0000 | INHALATION_SPRAY | Freq: Four times a day (QID) | RESPIRATORY_TRACT | 6 refills | Status: DC | PRN

## 2020-02-24 MED ORDER — ANORO ELLIPTA 62.5-25 MCG/INH IN AEPB: 1.0000 | INHALATION_SPRAY | Freq: Every day | RESPIRATORY_TRACT | 3 refills | Status: DC

## 2020-02-24 NOTE — Patient Instructions (Addendum)
Thank you for visiting Dr. Valeta Harms at [facility]. Today we recommend the following: Orders Placed This Encounter  Procedures  . Pulmonary Function Test   Meds ordered this encounter  Medications  . albuterol (VENTOLIN HFA) 108 (90 Base) MCG/ACT inhaler    Sig: Inhale 2 puffs into the lungs every 6 (six) hours as needed for wheezing or shortness of breath.    Dispense:  8 g    Refill:  6  . umeclidinium-vilanterol (ANORO ELLIPTA) 62.5-25 MCG/INH AEPB    Sig: Inhale 1 puff into the lungs daily.    Dispense:  1 each    Refill:  3   Please schedule follow up appointment after PFTs are complete or on the same day.   Samples of Anoro ellipta.   Return in about 6 weeks (around 04/06/2020) for w/ Eric Form, NP or Dr. Valeta Harms .    Please do your part to reduce the spread of COVID-19.

## 2020-02-24 NOTE — Progress Notes (Signed)
Synopsis: Referred in December 2021 for COPD by Magdalen Spatz, NP  Subjective:   PATIENT ID: Debra Trevino GENDER: female DOB: 01-05-1956, MRN: [id number]  Chief Complaint  Patient presents with  . Consult    SHOB and emphysema showed up on her CT scan.     64 year old female past medical history of hypertension, gastroesophageal reflux, gastric ulcer, carpal tunnel syndrome.  Patient last seen in the office by Eric Form, NP on 01/11/2020.  She is current every day smoker 50+ pack year history.  She is followed in our lung cancer screening program.  She does have shortness of breath and after discussion with the telephone visit regarding her lung cancer screening CT recommended evaluation by pulmonary.  From a respiratory standpoint she smokes a half a pack a day.  Does not have an albuterol inhaler and has never used one.  She does feel dyspneic on exertion when she exerts herself.  She plays tennis.  She does have labored breathing when she walks or climbs stairs.   Past Medical History:  Diagnosis Date  . Bilateral foot pain    morton neuroma, seeing podiatrist  . CTS (carpal tunnel syndrome)    has seen hand specilist  . Depression 03/24/2007   Qualifier: Diagnosis of  By: Arnoldo Morale MD, Balinda Quails   . Gastric ulcer    per patient  . GERD (gastroesophageal reflux disease) 05/27/2014   Sees Dr. Earlean Shawl; hx erosive esophagitis  . Hot flashes    recur when stops HRT  . HTN (hypertension)   . Low back pain    chronic  . Migraines    resolved on HRT for hot flases  . Muscle spasm    very active, muscle spasm after tennis matches     Family History  Problem Relation Age of Onset  . Charcot-Marie-Tooth disease Father   . Renal cancer Mother   . Schizophrenia Brother   . Hypertension Other      Past Surgical History:  Procedure Laterality Date  . CARPAL TUNNEL RELEASE     right hand  . MEDIAL PARTIAL KNEE REPLACEMENT  2017   Right knee  . TONSILLECTOMY      Social  History   Socioeconomic History  . Marital status: Married    Spouse name: Not on file  . Number of children: Not on file  . Years of education: Not on file  . Highest education level: Not on file  Occupational History  . Not on file  Tobacco Use  . Smoking status: Current Every Day Smoker    Packs/day: 1.00    Years: 50.00    Pack years: 50.00    Types: Cigarettes  . Smokeless tobacco: Never Used  Vaping Use  . Vaping Use: Never used  Substance and Sexual Activity  . Alcohol use: Yes    Comment: several drinks every day  . Drug use: No  . Sexual activity: Not on file  Other Topics Concern  . Not on file  Social History Narrative   Work or School: 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      Home Situation: live with husband - boys off at college      Spiritual Beliefs: catholic      Lifestyle: lots of exercise - tennis; diet is healthy      Social Determinants of Radio broadcast assistant Strain: Not on file  Food Insecurity: Not on file  Transportation Needs: Not on file  Physical Activity: Not on file  Stress: Not on file  Social Connections: Not on file  Intimate Partner Violence: Not on file     Allergies  Allergen Reactions  . Penicillins Swelling    Has patient had a PCN reaction causing immediate rash, facial/tongue/throat swelling, SOB or lightheadedness with hypotension:YES Has patient had a PCN reaction causing severe rash involving mucus membranes or skin necrosis: NO Has patient had a PCN reaction that required hospitalization NO Has patient had a PCN reaction occurring within the last 10 years: NO If all of the above answers are "NO", then may proceed with Cephalosporin use.     Outpatient Medications Prior to Visit  Medication Sig Dispense Refill  . bisoprolol-hydrochlorothiazide (ZIAC) 2.5-6.25 MG tablet Take 1 tablet by mouth daily. 90 tablet 3  . citalopram (CELEXA) 20 MG tablet TAKE 1 TABLET BY MOUTH EVERY DAY 90 tablet 0  .  cyclobenzaprine (FLEXERIL) 10 MG tablet Take 10 mg by mouth 3 (three) times daily as needed for muscle spasms.    Marland Kitchen gabapentin (NEURONTIN) 100 MG capsule Take 100 mg by mouth at bedtime.     . meloxicam (MOBIC) 15 MG tablet Take 15 mg by mouth daily.    . Menthol-Methyl Salicylate (GRX ANALGESIC BALM) OINT Apply 1 application topically 3 (three) times daily as needed (pain).     . Omeprazole 20 MG TBEC Take 20 mg by mouth daily.   3  . aspirin 81 MG chewable tablet Chew 81 mg by mouth daily.      No facility-administered medications prior to visit.    ROS   Objective:  Physical Exam   Vitals:   02/24/20 1617  BP: 120/68  Pulse: 63  Temp: 97.7 F (36.5 C)  TempSrc: Tympanic  SpO2: 97%  Weight: 162 lb (73.5 kg)  Height: 5\' 3"  (1.6 m)   97% on RA BMI Readings from Last 3 Encounters:  02/24/20 28.70 kg/m  06/26/19 28.52 kg/m  06/24/19 27.46 kg/m   Wt Readings from Last 3 Encounters:  02/24/20 162 lb (73.5 kg)  06/26/19 161 lb (73 kg)  06/24/19 155 lb (70.3 kg)     CBC    Component Value Date/Time   WBC 7.7 06/24/2019 0633   RBC 4.56 06/24/2019 0633   HGB 14.4 06/24/2019 0633   HCT 42.8 06/24/2019 0633   PLT 230 06/24/2019 0633   MCV 93.9 06/24/2019 0633   MCH 31.6 06/24/2019 0633   MCHC 33.6 06/24/2019 0633   RDW 12.8 06/24/2019 0633   LYMPHSABS 1.9 06/24/2019 0633   MONOABS 0.7 06/24/2019 0633   EOSABS 0.2 06/24/2019 0633   BASOSABS 0.0 06/24/2019 0633     Chest Imaging: 08/26/2019 lung cancer screening CT Lung RADS 1 recommending 17-month follow-up no suspicious lesions.  She does have evidence of centrilobular emphysema per my review. The patient's images have been independently reviewed by me.    Pulmonary Functions Testing Results: No flowsheet data found.  FeNO:   Pathology:   Echocardiogram:   Heart Catheterization:     Assessment & Plan:     ICD-10-CM   1. Centrilobular emphysema (HCC)  J43.2 Pulmonary Function Test    albuterol  (VENTOLIN HFA) 108 (90 Base) MCG/ACT inhaler  2. Dyspnea on exertion  R06.00 Pulmonary Function Test  3. SOB (shortness of breath)  R06.02   4. Current smoker  F17.200   5. Encounter for smoking cessation counseling  Z71.6     Discussion: This is a 64 year old female, current  smoker, likely has mild COPD at baseline.  She has not had pulmonary function test in the past.  She does have radiographic evidence of centrilobular emphysema on lung cancer screening CTs.  Plan: Please see separate note regarding counseling for smoking cessation. Recommend starting Anoro Ellipta Also giving a prescription for albuterol to be used as needed. We will also order full pulmonary function test.  Patient to follow-up in clinic after full PFTs with myself or Eric Form, NP to review results.   Current Outpatient Medications:  .  bisoprolol-hydrochlorothiazide (ZIAC) 2.5-6.25 MG tablet, Take 1 tablet by mouth daily., Disp: 90 tablet, Rfl: 3 .  citalopram (CELEXA) 20 MG tablet, TAKE 1 TABLET BY MOUTH EVERY DAY, Disp: 90 tablet, Rfl: 0 .  cyclobenzaprine (FLEXERIL) 10 MG tablet, Take 10 mg by mouth 3 (three) times daily as needed for muscle spasms., Disp: , Rfl:  .  gabapentin (NEURONTIN) 100 MG capsule, Take 100 mg by mouth at bedtime. , Disp: , Rfl:  .  meloxicam (MOBIC) 15 MG tablet, Take 15 mg by mouth daily., Disp: , Rfl:  .  Menthol-Methyl Salicylate (GRX ANALGESIC BALM) OINT, Apply 1 application topically 3 (three) times daily as needed (pain). , Disp: , Rfl:  .  Omeprazole 20 MG TBEC, Take 20 mg by mouth daily. , Disp: , Rfl: 3    Garner Nash, DO [facility] 02/24/2020 4:54 PM

## 2020-02-24 NOTE — Progress Notes (Signed)
Smoking Cessation Counseling:   The patient's current tobacco use: 0.5ppd, smoked for 15+ years  The patient was advised to quit and impact of smoking on their health.  I assessed the patient's willingness to attempt to quit. I provided methods and skills for cessation. Discussed tapering method.  We reviewed medication management of smoking session drugs if appropriate. Resources to help quit smoking were provided. A smoking cessation quit date was set: was not set  Follow-up was arranged in our clinic.  The amount of time spent counseling patient was 10 minutes

## 2020-03-18 ENCOUNTER — Other Ambulatory Visit: Payer: Self-pay | Admitting: Family Medicine

## 2020-03-25 ENCOUNTER — Encounter: Payer: Self-pay | Admitting: Family Medicine

## 2020-03-25 DIAGNOSIS — Z1212 Encounter for screening for malignant neoplasm of rectum: Secondary | ICD-10-CM | POA: Diagnosis not present

## 2020-03-25 DIAGNOSIS — Z1211 Encounter for screening for malignant neoplasm of colon: Secondary | ICD-10-CM | POA: Diagnosis not present

## 2020-04-14 LAB — COLOGUARD
COLOGUARD: NEGATIVE
Cologuard: NEGATIVE

## 2020-04-15 ENCOUNTER — Telehealth: Payer: Self-pay

## 2020-04-15 NOTE — Telephone Encounter (Signed)
Informed patient of negative cologuard.

## 2020-04-29 DIAGNOSIS — M25562 Pain in left knee: Secondary | ICD-10-CM | POA: Diagnosis not present

## 2020-05-01 ENCOUNTER — Other Ambulatory Visit: Payer: Self-pay | Admitting: Family Medicine

## 2020-05-19 DIAGNOSIS — M1712 Unilateral primary osteoarthritis, left knee: Secondary | ICD-10-CM | POA: Diagnosis not present

## 2020-05-19 NOTE — Progress Notes (Signed)
Subjective:   Kemya Shed is a 65 y.o. female who presents for an Initial Medicare Annual Wellness Visit.  I connected with Dorris Carnes  today by telephone and verified that I am speaking with the correct person using two identifiers. Location patient: home Location provider: work Persons participating in the virtual visit: patient, provider.   I discussed the limitations, risks, security and privacy concerns of performing an evaluation and management service by telephone and the availability of in person appointments. I also discussed with the patient that there may be a patient responsible charge related to this service. The patient expressed understanding and verbally consented to this telephonic visit.    Interactive audio and video telecommunications were attempted between this provider and patient, however failed, due to patient having technical difficulties OR patient did not have access to video capability.  We continued and completed visit with audio only.      Review of Systems    N/A  Cardiac Risk Factors include: advanced age (>40men, >106 women);hypertension     Objective:    Today's Vitals   There is no height or weight on file to calculate BMI.  Advanced Directives 05/20/2020 06/24/2019 01/02/2016  Does Patient Have a Medical Advance Directive? Yes No Yes  Type of Paramedic of [place];Living will - Living will  Does patient want to make changes to medical advance directive? No - Patient declined - No - Patient declined  Copy of [place] in Chart? No - copy requested - -  Would patient like information on creating a medical advance directive? - No - Patient declined -    Current Medications (verified) Outpatient Encounter Medications as of 05/20/2020  Medication Sig   bisoprolol-hydrochlorothiazide (ZIAC) 2.5-6.25 MG tablet TAKE 1 TABLET BY MOUTH EVERY DAY   citalopram (CELEXA) 20 MG tablet TAKE 1 TABLET BY  MOUTH EVERY DAY   gabapentin (NEURONTIN) 100 MG capsule Take 100 mg by mouth at bedtime.    meloxicam (MOBIC) 15 MG tablet Take 15 mg by mouth daily.   Menthol-Methyl Salicylate (GRX ANALGESIC BALM) OINT Apply 1 application topically 3 (three) times daily as needed (pain).    Omeprazole 20 MG TBEC Take 20 mg by mouth daily.    albuterol (VENTOLIN HFA) 108 (90 Base) MCG/ACT inhaler Inhale 2 puffs into the lungs every 6 (six) hours as needed for wheezing or shortness of breath. (Patient not taking: Reported on 05/20/2020)   cyclobenzaprine (FLEXERIL) 10 MG tablet Take 10 mg by mouth 3 (three) times daily as needed for muscle spasms. (Patient not taking: Reported on 05/20/2020)   umeclidinium-vilanterol (ANORO ELLIPTA) 62.5-25 MCG/INH AEPB Inhale 1 puff into the lungs daily. (Patient not taking: Reported on 05/20/2020)   [DISCONTINUED] umeclidinium-vilanterol (ANORO ELLIPTA) 62.5-25 MCG/INH AEPB Inhale 1 puff into the lungs daily. (Patient not taking: Reported on 05/20/2020)   No facility-administered encounter medications on file as of 05/20/2020.    Allergies (verified) Penicillins   History: Past Medical History:  Diagnosis Date   Bilateral foot pain    morton neuroma, seeing podiatrist   CTS (carpal tunnel syndrome)    has seen hand specilist   Depression 03/24/2007   Qualifier: Diagnosis of  By: Arnoldo Morale MD, Balinda Quails    Gastric ulcer    per patient   GERD (gastroesophageal reflux disease) 05/27/2014   Sees Dr. Earlean Shawl; hx erosive esophagitis   Hot flashes    recur when stops HRT   HTN (hypertension)  Low back pain    chronic   Migraines    resolved on HRT for hot flases   Muscle spasm    very active, muscle spasm after tennis matches   Past Surgical History:  Procedure Laterality Date   CARPAL TUNNEL RELEASE     right hand   MEDIAL PARTIAL KNEE REPLACEMENT  2017   Right knee   TONSILLECTOMY     Family History  Problem Relation Age of Onset    Charcot-Marie-Tooth disease Father    Renal cancer Mother    Schizophrenia Brother    Hypertension Other    Social History   Socioeconomic History   Marital status: Married    Spouse name: Not on file   Number of children: Not on file   Years of education: Not on file   Highest education level: Not on file  Occupational History   Not on file  Tobacco Use   Smoking status: Current Every Day Smoker    Packs/day: 1.00    Years: 50.00    Pack years: 50.00    Types: Cigarettes   Smokeless tobacco: Never Used  Vaping Use   Vaping Use: Never used  Substance and Sexual Activity   Alcohol use: Yes    Comment: several drinks every day   Drug use: No   Sexual activity: Not on file  Other Topics Concern   Not on file  Social History Narrative   Work or School: 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      Home Situation: live with husband - boys off at college      Spiritual Beliefs: catholic      Lifestyle: lots of exercise - tennis; diet is healthy      Social Determinants of Radio broadcast assistant Strain: Low Risk    Difficulty of Paying Living Expenses: Not hard at all  Food Insecurity: No Food Insecurity   Worried About Charity fundraiser in the Last Year: Never true   Arboriculturist in the Last Year: Never true  Transportation Needs: No Transportation Needs   Lack of Transportation (Medical): No   Lack of Transportation (Non-Medical): No  Physical Activity: Sufficiently Active   Days of Exercise per Week: 5 days   Minutes of Exercise per Session: 60 min  Stress: No Stress Concern Present   Feeling of Stress : Not at all  Social Connections: Moderately Isolated   Frequency of Communication with Friends and Family: More than three times a week   Frequency of Social Gatherings with Friends and Family: Three times a week   Attends Religious Services: Never   Active Member of Clubs or Organizations: No   Attends Programme researcher, broadcasting/film/video: Never   Marital Status: Married    Tobacco Counseling Ready to quit: Not Answered Counseling given: Not Answered   Clinical Intake:  Pre-visit preparation completed: Yes  Pain : No/denies pain     Nutritional Risks: None Diabetes: No  How often do you need to have someone help you when you read instructions, pamphlets, or other written materials from your doctor or pharmacy?: 1 - Never What is the last grade level you completed in school?: College  Diabetic?No     Information entered by :: [place] of Daily Living In your present state of health, do you have any difficulty performing the following activities: 05/20/2020  Hearing? N  Vision? N  Difficulty concentrating or making decisions? N  Walking or climbing stairs? N  Dressing or bathing? N  Doing errands, shopping? N  Preparing Food and eating ? N  Using the Toilet? N  In the past six months, have you accidently leaked urine? Y  Do you have problems with loss of bowel control? Y  Managing your Medications? N  Managing your Finances? N  Housekeeping or managing your Housekeeping? N  Some recent data might be hidden    Patient Care Team: Eulas Post, MD as PCP - General (Family Medicine) Buford Dresser, MD as PCP - Cardiology (Cardiology) Richmond Campbell, MD as Consulting Physician (Gastroenterology)  Indicate any recent Medical Services you may have received from other than [facility] providers in the past year (date may be approximate).     Assessment:   This is a routine wellness examination for Dione.  Hearing/Vision screen  Hearing Screening   125Hz  250Hz  500Hz  1000Hz  2000Hz  3000Hz  4000Hz  6000Hz  8000Hz   Right ear:           Left ear:           Vision Screening Comments: Gets annual eye exams. Wears reading glasses    Dietary issues and exercise activities discussed: Current Exercise Habits: Home exercise routine, Time (Minutes): 60, Frequency  (Times/Week): 5, Weekly Exercise (Minutes/Week): 300, Exercise limited by: None identified  Goals   None    Depression Screen PHQ 2/9 Scores 05/20/2020 05/18/2019 05/18/2019 05/12/2018  PHQ - 2 Score 0 0 0 0  PHQ- 9 Score - - 0 0    Fall Risk Fall Risk  05/20/2020 05/18/2019 05/18/2019  Falls in the past year? 0 1 1  Number falls in past yr: 0 0 0  Injury with Fall? 0 0 0  Risk for fall due to : No Fall Risks - -  Follow up Falls evaluation completed;Falls prevention discussed Education provided -    FALL RISK PREVENTION PERTAINING TO THE HOME:  Any stairs in or around the home? Yes  If so, are there any without handrails? No  Home free of loose throw rugs in walkways, pet beds, electrical cords, etc? Yes  Adequate lighting in your home to reduce risk of falls? Yes   ASSISTIVE DEVICES UTILIZED TO PREVENT FALLS:  Life alert? No  Use of a cane, walker or w/c? No  Grab bars in the bathroom? Yes  Shower chair or bench in shower? No  Elevated toilet seat or a handicapped toilet? No    Cognitive Function:   Normal cognitive status assessed by direct observation by this Nurse Health Advisor. No abnormalities found.        Immunizations Immunization History  Administered Date(s) Administered   Fluad Quad(high Dose 65+) 12/02/2018   Influenza Split 11/27/2011   Influenza Whole 02/15/2009   Influenza,inj,Quad PF,6+ Mos 12/04/2012, 11/30/2014, 01/13/2018   Influenza-Unspecified 12/19/2016, 12/10/2019   PFIZER(Purple Top)SARS-COV-2 Vaccination 04/03/2019, 04/22/2019, 12/10/2019   Pneumococcal Conjugate-13 12/19/2018   Pneumococcal Polysaccharide-23 12/02/2018   Td 06/11/1998   Tdap 12/04/2012   Zoster 08/16/2014    TDAP status: Up to date  Flu Vaccine status: Up to date  Pneumococcal vaccine status: Up to date  Covid-19 vaccine status: Completed vaccines  Qualifies for Shingles Vaccine? Yes   Zostavax completed Yes   Shingrix Completed?: No.    Education has  been provided regarding the importance of this vaccine. Patient has been advised to call insurance company to determine out of pocket expense if they have not yet received this vaccine. Advised may also receive vaccine  at local pharmacy or Health Dept. Verbalized acceptance and understanding.  Screening Tests Health Maintenance  Topic Date Due   Fecal DNA (Cologuard)  Never done   DEXA SCAN  Never done   MAMMOGRAM  01/04/2022   TETANUS/TDAP  12/05/2022   INFLUENZA VACCINE  Completed   COVID-19 Vaccine  Completed   Hepatitis C Screening  Completed   PNA vac Low Risk Adult  Completed   HPV VACCINES  Aged Out    Health Maintenance  Health Maintenance Due  Topic Date Due   Fecal DNA (Cologuard)  Never done   DEXA SCAN  Never done    Colorectal cancer screening: Referral to GI placed 05/20/2020. Pt aware the office will call re: appt.  Mammogram status: Completed 01/05/2020. Repeat every year  Bone Density status: Ordered 05/20/2020. Pt provided with contact info and advised to call to schedule appt.  Lung Cancer Screening: (Low Dose CT Chest recommended if Age 80-80 years, 30 pack-year currently smoking OR have quit w/in 15years.) does qualify.   Lung Cancer Screening Referral: No    Additional Screening:  Hepatitis C Screening: does qualify; Completed 05/18/2019  Vision Screening: Recommended annual ophthalmology exams for early detection of glaucoma and other disorders of the eye. Is the patient up to date with their annual eye exam?  Yes  Who is the provider or what is the name of the office in which the patient attends annual eye exams? Patient can not remember eye doctor  If pt is not established with a provider, would they like to be referred to a provider to establish care? No .   Dental Screening: Recommended annual dental exams for proper oral hygiene  Community Resource Referral / Chronic Care Management: CRR required this visit?  No   CCM required  this visit?  No      Plan:     I have personally reviewed and noted the following in the patients chart:    Medical and social history  Use of alcohol, tobacco or illicit drugs   Current medications and supplements  Functional ability and status  Nutritional status  Physical activity  Advanced directives  List of other physicians  Hospitalizations, surgeries, and ER visits in previous 12 months  Vitals  Screenings to include cognitive, depression, and falls  Referrals and appointments  In addition, I have reviewed and discussed with patient certain preventive protocols, quality metrics, and best practice recommendations. A written personalized care plan for preventive services as well as general preventive health recommendations were provided to patient.     Ofilia Neas, LPN   08/06/7822   Nurse Notes: None

## 2020-05-20 ENCOUNTER — Ambulatory Visit (INDEPENDENT_AMBULATORY_CARE_PROVIDER_SITE_OTHER): Payer: Medicare Other

## 2020-05-20 DIAGNOSIS — Z0000 Encounter for general adult medical examination without abnormal findings: Secondary | ICD-10-CM | POA: Diagnosis not present

## 2020-05-20 DIAGNOSIS — Z780 Asymptomatic menopausal state: Secondary | ICD-10-CM

## 2020-05-20 NOTE — Patient Instructions (Signed)
Debra Trevino , Thank you for taking time to come for your Medicare Wellness Visit. I appreciate your ongoing commitment to your health goals. Please review the following plan we discussed and let me know if I can assist you in the future.   Screening recommendations/referrals: Colonoscopy: Pending you cologuard results from 2021. We see where orders were placed for Cologuard however we have not received any results  Mammogram: Up to date, next due 01/04/2021 Bone Density: Currently due, orders placed this visit. You may wait to get this scheduled at [facility] at the time of your mammogram in October if you wish. Recommended yearly ophthalmology/optometry visit for glaucoma screening and checkup Recommended yearly dental visit for hygiene and checkup  Vaccinations: Influenza vaccine: Up to date, next due fall 2022  Pneumococcal vaccine: Completed series  Tdap vaccine: Up to date, next due 12/05/2022 Shingles vaccine: Currently due for Shingrix, you had previous Shingles vaccine called Zostavax. You do not have to receive the two dose series of Shingrix if you do not want to however it does give some added protection against shingles. If you would to receive we recommend that you do so at your local pharmacy as it is less expensive     Advanced directives: Please bring in copies of your advanced medical directives into our office so that we may scan them into your chart.   Conditions/risks identified: None   Next appointment: None    Preventive Care 65 Years and Older, Female Preventive care refers to lifestyle choices and visits with your health care provider that can promote health and wellness. What does preventive care include?  A yearly physical exam. This is also called an annual well check.  Dental exams once or twice a year.  Routine eye exams. Ask your health care provider how often you should have your eyes checked.  Personal lifestyle choices, including:  Daily care  of your teeth and gums.  Regular physical activity.  Eating a healthy diet.  Avoiding tobacco and drug use.  Limiting alcohol use.  Practicing safe sex.  Taking low-dose aspirin every day.  Taking vitamin and mineral supplements as recommended by your health care provider. What happens during an annual well check? The services and screenings done by your health care provider during your annual well check will depend on your age, overall health, lifestyle risk factors, and family history of disease. Counseling  Your health care provider may ask you questions about your:  Alcohol use.  Tobacco use.  Drug use.  Emotional well-being.  Home and relationship well-being.  Sexual activity.  Eating habits.  History of falls.  Memory and ability to understand (cognition).  Work and work Statistician.  Reproductive health. Screening  You may have the following tests or measurements:  Height, weight, and BMI.  Blood pressure.  Lipid and cholesterol levels. These may be checked every 5 years, or more frequently if you are over 81 years old.  Skin check.  Lung cancer screening. You may have this screening every year starting at age 15 if you have a 30-pack-year history of smoking and currently smoke or have quit within the past 15 years.  Fecal occult blood test (FOBT) of the stool. You may have this test every year starting at age 22.  Flexible sigmoidoscopy or colonoscopy. You may have a sigmoidoscopy every 5 years or a colonoscopy every 10 years starting at age 7.  Hepatitis C blood test.  Hepatitis B blood test.  Sexually transmitted disease (STD) testing.  Diabetes screening. This is done by checking your blood sugar (glucose) after you have not eaten for a while (fasting). You may have this done every 1-3 years.  Bone density scan. This is done to screen for osteoporosis. You may have this done starting at age 71.  Mammogram. This may be done every 1-2  years. Talk to your health care provider about how often you should have regular mammograms. Talk with your health care provider about your test results, treatment options, and if necessary, the need for more tests. Vaccines  Your health care provider may recommend certain vaccines, such as:  Influenza vaccine. This is recommended every year.  Tetanus, diphtheria, and acellular pertussis (Tdap, Td) vaccine. You may need a Td booster every 10 years.  Zoster vaccine. You may need this after age 63.  Pneumococcal 13-valent conjugate (PCV13) vaccine. One dose is recommended after age 63.  Pneumococcal polysaccharide (PPSV23) vaccine. One dose is recommended after age 73. Talk to your health care provider about which screenings and vaccines you need and how often you need them. This information is not intended to replace advice given to you by your health care provider. Make sure you discuss any questions you have with your health care provider. Document Released: 03/25/2015 Document Revised: 11/16/2015 Document Reviewed: 12/28/2014 Elsevier Interactive Patient Education  2017 [place] Prevention in the Home Falls can cause injuries. They can happen to people of all ages. There are many things you can do to make your home safe and to help prevent falls. What can I do on the outside of my home?  Regularly fix the edges of walkways and driveways and fix any cracks.  Remove anything that might make you trip as you walk through a door, such as a raised step or threshold.  Trim any bushes or trees on the path to your home.  Use bright outdoor lighting.  Clear any walking paths of anything that might make someone trip, such as rocks or tools.  Regularly check to see if handrails are loose or broken. Make sure that both sides of any steps have handrails.  Any raised decks and porches should have guardrails on the edges.  Have any leaves, snow, or ice cleared regularly.  Use  sand or salt on walking paths during winter.  Clean up any spills in your garage right away. This includes oil or grease spills. What can I do in the bathroom?  Use night lights.  Install grab bars by the toilet and in the tub and shower. Do not use towel bars as grab bars.  Use non-skid mats or decals in the tub or shower.  If you need to sit down in the shower, use a plastic, non-slip stool.  Keep the floor dry. Clean up any water that spills on the floor as soon as it happens.  Remove soap buildup in the tub or shower regularly.  Attach bath mats securely with double-sided non-slip rug tape.  Do not have throw rugs and other things on the floor that can make you trip. What can I do in the bedroom?  Use night lights.  Make sure that you have a light by your bed that is easy to reach.  Do not use any sheets or blankets that are too big for your bed. They should not hang down onto the floor.  Have a firm chair that has side arms. You can use this for support while you get dressed.  Do not have throw  rugs and other things on the floor that can make you trip. What can I do in the kitchen?  Clean up any spills right away.  Avoid walking on wet floors.  Keep items that you use a lot in easy-to-reach places.  If you need to reach something above you, use a strong step stool that has a grab bar.  Keep electrical cords out of the way.  Do not use floor polish or wax that makes floors slippery. If you must use wax, use non-skid floor wax.  Do not have throw rugs and other things on the floor that can make you trip. What can I do with my stairs?  Do not leave any items on the stairs.  Make sure that there are handrails on both sides of the stairs and use them. Fix handrails that are broken or loose. Make sure that handrails are as long as the stairways.  Check any carpeting to make sure that it is firmly attached to the stairs. Fix any carpet that is loose or worn.  Avoid  having throw rugs at the top or bottom of the stairs. If you do have throw rugs, attach them to the floor with carpet tape.  Make sure that you have a light switch at the top of the stairs and the bottom of the stairs. If you do not have them, ask someone to add them for you. What else can I do to help prevent falls?  Wear shoes that:  Do not have high heels.  Have rubber bottoms.  Are comfortable and fit you well.  Are closed at the toe. Do not wear sandals.  If you use a stepladder:  Make sure that it is fully opened. Do not climb a closed stepladder.  Make sure that both sides of the stepladder are locked into place.  Ask someone to hold it for you, if possible.  Clearly mark and make sure that you can see:  Any grab bars or handrails.  First and last steps.  Where the edge of each step is.  Use tools that help you move around (mobility aids) if they are needed. These include:  Canes.  Walkers.  Scooters.  Crutches.  Turn on the lights when you go into a dark area. Replace any light bulbs as soon as they burn out.  Set up your furniture so you have a clear path. Avoid moving your furniture around.  If any of your floors are uneven, fix them.  If there are any pets around you, be aware of where they are.  Review your medicines with your doctor. Some medicines can make you feel dizzy. This can increase your chance of falling. Ask your doctor what other things that you can do to help prevent falls. This information is not intended to replace advice given to you by your health care provider. Make sure you discuss any questions you have with your health care provider. Document Released: 12/23/2008 Document Revised: 08/04/2015 Document Reviewed: 04/02/2014 Elsevier Interactive Patient Education  2017 [employer].

## 2020-06-03 ENCOUNTER — Telehealth: Payer: Self-pay

## 2020-06-03 NOTE — Telephone Encounter (Signed)
LMVM to contact the office to schedule a surgical clearance appointment

## 2020-06-03 NOTE — Telephone Encounter (Signed)
Patient needs surgery clearance appointment, thanks!

## 2020-07-05 DIAGNOSIS — D485 Neoplasm of uncertain behavior of skin: Secondary | ICD-10-CM | POA: Diagnosis not present

## 2020-07-05 DIAGNOSIS — D1801 Hemangioma of skin and subcutaneous tissue: Secondary | ICD-10-CM | POA: Diagnosis not present

## 2020-07-05 DIAGNOSIS — Z85828 Personal history of other malignant neoplasm of skin: Secondary | ICD-10-CM | POA: Diagnosis not present

## 2020-07-05 DIAGNOSIS — L814 Other melanin hyperpigmentation: Secondary | ICD-10-CM | POA: Diagnosis not present

## 2020-07-05 DIAGNOSIS — L570 Actinic keratosis: Secondary | ICD-10-CM | POA: Diagnosis not present

## 2020-07-05 DIAGNOSIS — L820 Inflamed seborrheic keratosis: Secondary | ICD-10-CM | POA: Diagnosis not present

## 2020-07-05 DIAGNOSIS — D225 Melanocytic nevi of trunk: Secondary | ICD-10-CM | POA: Diagnosis not present

## 2020-07-05 DIAGNOSIS — L821 Other seborrheic keratosis: Secondary | ICD-10-CM | POA: Diagnosis not present

## 2020-07-13 DIAGNOSIS — G5602 Carpal tunnel syndrome, left upper limb: Secondary | ICD-10-CM | POA: Diagnosis not present

## 2020-07-13 DIAGNOSIS — M13841 Other specified arthritis, right hand: Secondary | ICD-10-CM | POA: Diagnosis not present

## 2020-07-13 DIAGNOSIS — M13832 Other specified arthritis, left wrist: Secondary | ICD-10-CM | POA: Diagnosis not present

## 2020-07-13 DIAGNOSIS — G5603 Carpal tunnel syndrome, bilateral upper limbs: Secondary | ICD-10-CM | POA: Diagnosis not present

## 2020-07-15 ENCOUNTER — Encounter: Payer: Self-pay | Admitting: Family Medicine

## 2020-07-15 ENCOUNTER — Other Ambulatory Visit: Payer: Self-pay

## 2020-07-15 ENCOUNTER — Ambulatory Visit (INDEPENDENT_AMBULATORY_CARE_PROVIDER_SITE_OTHER): Payer: Medicare Other | Admitting: Family Medicine

## 2020-07-15 VITALS — BP 122/64 | HR 76 | Temp 97.6°F | Wt 159.6 lb

## 2020-07-15 DIAGNOSIS — R202 Paresthesia of skin: Secondary | ICD-10-CM | POA: Diagnosis not present

## 2020-07-15 NOTE — Progress Notes (Signed)
Established Patient Office Visit  Subjective:  Patient ID: Debra Trevino, female    DOB: December 27, 1955  Age: 65 y.o. MRN: [id number]  CC:  Chief Complaint  Patient presents with  . Numbness    Both hand, would like a referral to neurology    HPI Debra Trevino presents for paresthesias involving left upper extremity.  She has history of bilateral carpal tunnel syndrome and recently had seen hand surgeon and reportedly had steroid injection left wrist.  This has not made much difference thus far.  She has paresthesias which are intermittent and radiate all the way up to the shoulder at times.  Occasional left neck pains but no consistent neck pain.  No radiculitis symptoms.  No definite weakness.  She states her right upper extremity is basically normal at this time.  Numbness and paresthesias involve basically all digits of the left hand and her orthopedic hand surgeon had discussed with her that he was not convinced this was all carpal tunnel syndrome related.  She is here now to get this further assessed.  She had B12 and thyroid levels checked a year ago that were normal.  Denies any visual changes.  No other areas of paresthesia.  Past Medical History:  Diagnosis Date  . Bilateral foot pain    morton neuroma, seeing podiatrist  . CTS (carpal tunnel syndrome)    has seen hand specilist  . Depression 03/24/2007   Qualifier: Diagnosis of  By: Arnoldo Morale MD, Balinda Quails   . Gastric ulcer    per patient  . GERD (gastroesophageal reflux disease) 05/27/2014   Sees Dr. Earlean Shawl; hx erosive esophagitis  . Hot flashes    recur when stops HRT  . HTN (hypertension)   . Low back pain    chronic  . Migraines    resolved on HRT for hot flases  . Muscle spasm    very active, muscle spasm after tennis matches    Past Surgical History:  Procedure Laterality Date  . CARPAL TUNNEL RELEASE     right hand  . MEDIAL PARTIAL KNEE REPLACEMENT  2017   Right knee  . TONSILLECTOMY      Family  History  Problem Relation Age of Onset  . Charcot-Marie-Tooth disease Father   . Renal cancer Mother   . Schizophrenia Brother   . Hypertension Other     Social History   Socioeconomic History  . Marital status: Married    Spouse name: Not on file  . Number of children: Not on file  . Years of education: Not on file  . Highest education level: Not on file  Occupational History  . Not on file  Tobacco Use  . Smoking status: Current Every Day Smoker    Packs/day: 1.00    Years: 50.00    Pack years: 50.00    Types: Cigarettes  . Smokeless tobacco: Never Used  Vaping Use  . Vaping Use: Never used  Substance and Sexual Activity  . Alcohol use: Yes    Comment: several drinks every day  . Drug use: No  . Sexual activity: Not on file  Other Topics Concern  . Not on file  Social History Narrative   Work or School: 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      Home Situation: live with husband - boys off at college      Spiritual Beliefs: catholic      Lifestyle: lots of exercise - tennis; diet is healthy  Social Determinants of Health   Financial Resource Strain: Low Risk   . Difficulty of Paying Living Expenses: Not hard at all  Food Insecurity: No Food Insecurity  . Worried About Charity fundraiser in the Last Year: Never true  . Ran Out of Food in the Last Year: Never true  Transportation Needs: No Transportation Needs  . Lack of Transportation (Medical): No  . Lack of Transportation (Non-Medical): No  Physical Activity: Sufficiently Active  . Days of Exercise per Week: 5 days  . Minutes of Exercise per Session: 60 min  Stress: No Stress Concern Present  . Feeling of Stress : Not at all  Social Connections: Moderately Isolated  . Frequency of Communication with Friends and Family: More than three times a week  . Frequency of Social Gatherings with Friends and Family: Three times a week  . Attends Religious Services: Never  . Active Member of Clubs or  Organizations: No  . Attends Archivist Meetings: Never  . Marital Status: Married  Human resources officer Violence: Not At Risk  . Fear of Current or Ex-Partner: No  . Emotionally Abused: No  . Physically Abused: No  . Sexually Abused: No    Outpatient Medications Prior to Visit  Medication Sig Dispense Refill  . albuterol (VENTOLIN HFA) 108 (90 Base) MCG/ACT inhaler Inhale 2 puffs into the lungs every 6 (six) hours as needed for wheezing or shortness of breath. 8 g 6  . bisoprolol-hydrochlorothiazide (ZIAC) 2.5-6.25 MG tablet TAKE 1 TABLET BY MOUTH EVERY DAY 90 tablet 3  . citalopram (CELEXA) 20 MG tablet TAKE 1 TABLET BY MOUTH EVERY DAY 90 tablet 0  . cyclobenzaprine (FLEXERIL) 10 MG tablet Take 10 mg by mouth 3 (three) times daily as needed for muscle spasms.    Marland Kitchen gabapentin (NEURONTIN) 100 MG capsule Take 100 mg by mouth at bedtime.     . meloxicam (MOBIC) 15 MG tablet Take 15 mg by mouth daily.    . Menthol-Methyl Salicylate (GRX ANALGESIC BALM) OINT Apply 1 application topically 3 (three) times daily as needed (pain).     . Omeprazole 20 MG TBEC Take 20 mg by mouth daily.   3  . umeclidinium-vilanterol (ANORO ELLIPTA) 62.5-25 MCG/INH AEPB Inhale 1 puff into the lungs daily. 2 each 0   No facility-administered medications prior to visit.    Allergies  Allergen Reactions  . Penicillins Swelling    Has patient had a PCN reaction causing immediate rash, facial/tongue/throat swelling, SOB or lightheadedness with hypotension:YES Has patient had a PCN reaction causing severe rash involving mucus membranes or skin necrosis: NO Has patient had a PCN reaction that required hospitalization NO Has patient had a PCN reaction occurring within the last 10 years: NO If all of the above answers are "NO", then may proceed with Cephalosporin use.    ROS Review of Systems  Constitutional: Negative for chills and fever.  Eyes: Negative for visual disturbance.  Respiratory: Negative for  shortness of breath.   Cardiovascular: Negative for chest pain.  Neurological: Positive for numbness. Negative for weakness and headaches.  Hematological: Negative for adenopathy.      Objective:    Physical Exam Vitals reviewed.  Constitutional:      Appearance: Normal appearance.  Cardiovascular:     Rate and Rhythm: Normal rate and regular rhythm.  Pulmonary:     Effort: Pulmonary effort is normal.     Breath sounds: Normal breath sounds.  Musculoskeletal:     Right lower  leg: No edema.     Left lower leg: No edema.     Comments: No cervical spinal tenderness.  Neurological:     Mental Status: She is alert.     Comments: She has full strength throughout upper extremities.  Symmetric upper extremity reflexes.  Normal sensory function with monofilament testing throughout     BP 122/64 (BP Location: Left Arm, Patient Position: Sitting, Cuff Size: Normal)   Pulse 76   Temp 97.6 F (36.4 C) (Oral)   Wt 159 lb 9.6 oz (72.4 kg)   SpO2 96%   BMI 28.27 kg/m  Wt Readings from Last 3 Encounters:  07/15/20 159 lb 9.6 oz (72.4 kg)  02/24/20 162 lb (73.5 kg)  06/26/19 161 lb (73 kg)     Health Maintenance Due  Topic Date Due  . DEXA SCAN  Never done    There are no preventive care reminders to display for this patient.  Lab Results  Component Value Date   TSH 1.50 06/26/2019   Lab Results  Component Value Date   WBC 7.7 06/24/2019   HGB 14.4 06/24/2019   HCT 42.8 06/24/2019   MCV 93.9 06/24/2019   PLT 230 06/24/2019   Lab Results  Component Value Date   NA 140 06/24/2019   K 4.2 06/24/2019   CO2 23 06/24/2019   GLUCOSE 131 (H) 06/24/2019   BUN 21 06/24/2019   CREATININE 0.72 06/24/2019   BILITOT 0.2 (L) 11/27/2012   ALKPHOS 59 11/27/2012   AST 18 11/27/2012   ALT 19 11/27/2012   PROT 6.3 11/27/2012   ALBUMIN 3.5 11/27/2012   CALCIUM 9.2 06/24/2019   ANIONGAP 12 06/24/2019   GFR 73.92 05/18/2019   Lab Results  Component Value Date   CHOL 185  08/15/2018   Lab Results  Component Value Date   HDL 49 08/15/2018   Lab Results  Component Value Date   LDLCALC 87 08/15/2018   Lab Results  Component Value Date   TRIG 244 (H) 08/15/2018   Lab Results  Component Value Date   CHOLHDL 3.8 08/15/2018   Lab Results  Component Value Date   HGBA1C 6.0 05/18/2019      Assessment & Plan:   Patient presents with at least 18-month history of intermittent paresthesias left upper extremity.  She has involvement of all 5 digits and this does not conform to pattern for carpal tunnel syndrome alone.  She is interested in further evaluation.  She is not really having any major neck pains but we explained this could be related to cervical nerve impingement.  We discussed option of neurology referral for further evaluation and possible nerve conduction velocities and she would like to proceed that route -We will set up neurology referral  No orders of the defined types were placed in this encounter.   Follow-up: No follow-ups on file.    Carolann Littler, MD

## 2020-07-15 NOTE — Patient Instructions (Signed)
I will set up referral for neurology consult/ nerve conduction testing of upper extremity.

## 2020-07-18 ENCOUNTER — Encounter: Payer: Self-pay | Admitting: Neurology

## 2020-08-10 ENCOUNTER — Other Ambulatory Visit: Payer: Self-pay | Admitting: Family Medicine

## 2020-08-12 ENCOUNTER — Other Ambulatory Visit: Payer: Self-pay | Admitting: Acute Care

## 2020-08-12 DIAGNOSIS — Z87891 Personal history of nicotine dependence: Secondary | ICD-10-CM

## 2020-08-12 DIAGNOSIS — F17210 Nicotine dependence, cigarettes, uncomplicated: Secondary | ICD-10-CM

## 2020-08-22 DIAGNOSIS — Z23 Encounter for immunization: Secondary | ICD-10-CM | POA: Diagnosis not present

## 2020-09-01 ENCOUNTER — Ambulatory Visit: Payer: Medicare Other | Admitting: Neurology

## 2020-09-01 ENCOUNTER — Ambulatory Visit
Admission: RE | Admit: 2020-09-01 | Discharge: 2020-09-01 | Disposition: A | Discharge status: Home / Self Care | Payer: Medicare Other | Source: Ambulatory Visit | Attending: Acute Care | Admitting: Acute Care

## 2020-09-01 DIAGNOSIS — Z87891 Personal history of nicotine dependence: Secondary | ICD-10-CM

## 2020-09-01 DIAGNOSIS — F17210 Nicotine dependence, cigarettes, uncomplicated: Secondary | ICD-10-CM

## 2020-09-06 NOTE — Progress Notes (Signed)
Please call patient and let them  know their  low dose Ct was read as a Lung RADS 1, negative study: no nodules or definitely benign nodules. Radiology recommendation is for a repeat LDCT in 12 months. .Please let them  know we will order and schedule their  annual screening scan for 08/2021. Pt.  is  currently  not on statin therapy. Please place order for annual  screening scan for  08/2021 and fax results to PCP. Thanks so much.

## 2020-09-07 ENCOUNTER — Telehealth: Payer: Self-pay | Admitting: *Deleted

## 2020-09-07 DIAGNOSIS — Z87891 Personal history of nicotine dependence: Secondary | ICD-10-CM

## 2020-09-07 DIAGNOSIS — F17210 Nicotine dependence, cigarettes, uncomplicated: Secondary | ICD-10-CM

## 2020-09-07 NOTE — Telephone Encounter (Signed)
Pt informed of CT results per Eric Form, NP.  PT verbalized understanding.  Copy of CT sent to PCP.  Order placed for 1 yr f/u CT. Pt also scheduled for PFT with f/u with Eric Form, NP per Dr Juline Patch instructions at last ov.

## 2020-09-08 ENCOUNTER — Encounter: Payer: Self-pay | Admitting: Neurology

## 2020-09-08 ENCOUNTER — Other Ambulatory Visit: Payer: Self-pay

## 2020-09-08 ENCOUNTER — Ambulatory Visit (INDEPENDENT_AMBULATORY_CARE_PROVIDER_SITE_OTHER): Payer: Medicare Other | Admitting: Neurology

## 2020-09-08 VITALS — BP 139/89 | HR 76 | Ht 63.0 in | Wt 160.0 lb

## 2020-09-08 DIAGNOSIS — G5603 Carpal tunnel syndrome, bilateral upper limbs: Secondary | ICD-10-CM | POA: Diagnosis not present

## 2020-09-08 DIAGNOSIS — R202 Paresthesia of skin: Secondary | ICD-10-CM | POA: Diagnosis not present

## 2020-09-08 DIAGNOSIS — M792 Neuralgia and neuritis, unspecified: Secondary | ICD-10-CM

## 2020-09-08 DIAGNOSIS — R292 Abnormal reflex: Secondary | ICD-10-CM

## 2020-09-08 NOTE — Progress Notes (Signed)
[facility] Clinic Note - Initial Visit   Date: 09/08/20  Debra Trevino: [id number] DOB: [birth year]   Dear Dr. Elease Hashimoto:  Thank you for your kind referral of Debra Trevino for consultation of numbness/tingling. Although her history is well known to you, please allow [place] to reiterate it for the purpose of our medical record. The patient was accompanied to the clinic by self.    History of Present Illness: Debra Trevino is a 65 y.o. left-handed female with carpal tunnel syndrome s/p release on the right, depression, GERD, hypertension, migraines, tobacco use, and alcohol use  presenting for evaluation of hand and feet paresthesias.   She has numbness and tingling in the soles since 2020.  She is more aware of the tingling at night time.  She takes gabapentin 100mg  at bedtime which helps.  Her father and maternal uncle has Charcot Marie Tooth.  She drink 2-3 beverages of liquor nightly x 20 years.  She is not diabetic.    Starting in the spring of 2022, she began having numbness over the left shoulder and arm.  Symptoms lasted a few minutes and recurred daily.  She received a steroid inject to her wrist and she has not had left arm numbness again. She had one episode sharp pain at the base of her neck.   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  She lives at home with husband and 67 year-old son.   Out-side paper records, electronic medical record, and images have been reviewed where available and summarized as:  Lab Results  Component Value Date   HGBA1C 6.0 05/18/2019   Lab Results  Component Value Date   [id number] 016 06/26/2019   Lab Results  Component Value Date   TSH 1.50 06/26/2019   No results found for: ESRSEDRATE, POCTSEDRATE  Past Medical History:  Diagnosis Date   Bilateral foot pain    morton neuroma, seeing podiatrist   CTS (carpal tunnel syndrome)    has seen hand specilist   Depression 03/24/2007   Qualifier:  Diagnosis of  By: Arnoldo Morale MD, John E    Gastric ulcer    per patient   GERD (gastroesophageal reflux disease) 05/27/2014   Sees Dr. Earlean Shawl; hx erosive esophagitis   Hot flashes    recur when stops HRT   HTN (hypertension)    Low back pain    chronic   Migraines    resolved on HRT for hot flases   Muscle spasm    very active, muscle spasm after tennis matches    Past Surgical History:  Procedure Laterality Date   CARPAL TUNNEL RELEASE     right hand   MEDIAL PARTIAL KNEE REPLACEMENT  2017   Right knee   TONSILLECTOMY       Medications:  Outpatient Encounter Medications as of 09/08/2020  Medication Sig   albuterol (VENTOLIN HFA) 108 (90 Base) MCG/ACT inhaler Inhale 2 puffs into the lungs every 6 (six) hours as needed for wheezing or shortness of breath.   bisoprolol-hydrochlorothiazide (ZIAC) 2.5-6.25 MG tablet TAKE 1 TABLET BY MOUTH EVERY DAY   citalopram (CELEXA) 20 MG tablet TAKE 1 TABLET BY MOUTH EVERY DAY   cyclobenzaprine (FLEXERIL) 10 MG tablet Take 10 mg by mouth 3 (three) times daily as needed for muscle spasms.   gabapentin (NEURONTIN) 100 MG capsule Take 100 mg by mouth at bedtime.    meloxicam (MOBIC) 15 MG tablet Take 15 mg by mouth daily.   Menthol-Methyl Salicylate (  GRX ANALGESIC BALM) OINT Apply 1 application topically 3 (three) times daily as needed (pain).    Omeprazole 20 MG TBEC Take 20 mg by mouth daily.    umeclidinium-vilanterol (ANORO ELLIPTA) 62.5-25 MCG/INH AEPB Inhale 1 puff into the lungs daily.   No facility-administered encounter medications on file as of 09/08/2020.    Allergies:  Allergies  Allergen Reactions   Penicillins Swelling    Has patient had a PCN reaction causing immediate rash, facial/tongue/throat swelling, SOB or lightheadedness with hypotension:YES Has patient had a PCN reaction causing severe rash involving mucus membranes or skin necrosis: NO Has patient had a PCN reaction that required hospitalization NO Has patient had a  PCN reaction occurring within the last 10 years: NO If all of the above answers are "NO", then may proceed with Cephalosporin use.    Family History: Family History  Problem Relation Age of Onset   Charcot-Marie-Tooth disease Father    Renal cancer Mother    Schizophrenia Brother    Hypertension Other     Social History: Social History   Tobacco Use   Smoking status: Every Day    Packs/day: 1.00    Years: 50.00    Pack years: 50.00    Types: Cigarettes   Smokeless tobacco: Never  Vaping Use   Vaping Use: Never used  Substance Use Topics   Alcohol use: Yes    Comment: several drinks every day   Drug use: No   Social History   Social History Narrative   Work or School: 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      Home Situation: live with husband - boys off at college      Spiritual Beliefs: catholic      Lifestyle: lots of exercise - tennis; diet is healthy      Write with her left hand and does everything else with her right hand       Lives in a two story home     Vital Signs:  BP 139/89   Pulse 76   Ht 5\' 3"  (1.6 m)   Wt 160 lb (72.6 kg)   SpO2 97%   BMI 28.34 kg/m   Neurological Exam: MENTAL STATUS including orientation to time, place, person, recent and remote memory, attention span and concentration, language, and fund of knowledge is normal.  Speech is not dysarthric.  CRANIAL NERVES: II:  No visual field defects.     III-IV-VI: Pupils equal round and reactive to light.  Normal conjugate, extra-ocular eye movements in all directions of gaze.  No nystagmus.  No ptosis.   V:  Normal facial sensation.    VII:  Normal facial symmetry and movements.   VIII:  Normal hearing and vestibular function.   IX-X:  Normal palatal movement.   XI:  Normal shoulder shrug and head rotation.   XII:  Normal tongue strength and range of motion, no deviation or fasciculation.  MOTOR:  No atrophy, fasciculations or abnormal movements.  No pronator drift.    Upper Extremity:  Right  Left  Deltoid  5/5   5/5   Biceps  5/5   5/5   Triceps  5/5   5/5   Infraspinatus 5/5  5/5  Medial pectoralis 5/5  5/5  Wrist extensors  5/5   5/5   Wrist flexors  5/5   5/5   Finger extensors  5/5   5/5   Finger flexors  5/5   5/5   Dorsal interossei  5/5   5/5   Abductor pollicis  5/5   5/5   Tone (Ashworth scale)  0  0   Lower Extremity:  Right  Left  Hip flexors  5/5   5/5   Hip extensors  5/5   5/5   Adductor 5/5  5/5  Abductor 5/5  5/5  Knee flexors  5/5   5/5   Knee extensors  5/5   5/5   Dorsiflexors  5/5   5/5   Plantarflexors  5/5   5/5   Toe extensors  5/5   5/5   Toe flexors  5/5   5/5   Tone (Ashworth scale)  0  0   MSRs:  Right        Left                  brachioradialis 3+  3+  biceps 3+  3+  triceps 2+  2+  patellar 2+  2+  ankle jerk 2+  2+  Hoffman no  no  plantar response down  down   SENSORY:  Minimally reduced vibration at the great toe bilaterally, temperature and pin prick in intact throughout.  There is mild sway with Rhomberg testing.  COORDINATION/GAIT: Normal finger-to- nose-finger.  Intact rapid alternating movements bilaterally.  Gait narrow based and stable. Stressed gait intact, unsteady with tandem gait.    IMPRESSION: Left arm paresthesias is concerning for cervical radiculopathy/canal stenosis as her exam shows brisk upper extremity reflexes  - MRI cervical spine wo contrast to evaluate for compressive pathology  2.  Bilateral feet paresthesias most likely due to neuropathy.  Father with Charcot Massie Maroon.  She does not have features of CMT on exam.  She does also consume alcohol which can lead to neuropathy.  NCS/EMG of the left arm and leg was offered, patient would like to proceed with imaging first.   - Continue gabapentin 100mg  at bedtime  - Advised to cut back on alcohol  3.  Bilateral carpal tunnel syndrome s/p release on the right.   Further recommendations pending results.    Thank you  for allowing me to participate in patient's care.  If I can answer any additional questions, I would be pleased to do so.    Sincerely,    Jeanie Mccard K. Posey Pronto, DO

## 2020-09-08 NOTE — Patient Instructions (Addendum)
MRI cervical spine without contrast  We will call you with the results and let you know the next step

## 2020-09-19 ENCOUNTER — Ambulatory Visit
Admission: RE | Admit: 2020-09-19 | Discharge: 2020-09-19 | Disposition: A | Discharge status: Home / Self Care | Payer: Medicare Other | Source: Ambulatory Visit | Attending: Neurology | Admitting: Neurology

## 2020-09-19 ENCOUNTER — Other Ambulatory Visit: Payer: Self-pay

## 2020-09-19 DIAGNOSIS — R202 Paresthesia of skin: Secondary | ICD-10-CM

## 2020-09-19 DIAGNOSIS — R292 Abnormal reflex: Secondary | ICD-10-CM

## 2020-09-19 DIAGNOSIS — M47812 Spondylosis without myelopathy or radiculopathy, cervical region: Secondary | ICD-10-CM | POA: Diagnosis not present

## 2020-09-19 DIAGNOSIS — M4319 Spondylolisthesis, multiple sites in spine: Secondary | ICD-10-CM | POA: Diagnosis not present

## 2020-09-19 DIAGNOSIS — G5603 Carpal tunnel syndrome, bilateral upper limbs: Secondary | ICD-10-CM

## 2020-09-27 ENCOUNTER — Telehealth: Payer: Self-pay | Admitting: Neurology

## 2020-09-27 NOTE — Telephone Encounter (Signed)
Pt is returning christy's call [phone]

## 2020-09-27 NOTE — Telephone Encounter (Signed)
Patient advised to continue gabapentin.

## 2020-10-05 ENCOUNTER — Encounter: Payer: Self-pay | Admitting: Acute Care

## 2020-10-05 ENCOUNTER — Ambulatory Visit (INDEPENDENT_AMBULATORY_CARE_PROVIDER_SITE_OTHER): Payer: Medicare Other | Admitting: Pulmonary Disease

## 2020-10-05 ENCOUNTER — Ambulatory Visit (INDEPENDENT_AMBULATORY_CARE_PROVIDER_SITE_OTHER): Payer: Medicare Other | Admitting: Acute Care

## 2020-10-05 ENCOUNTER — Other Ambulatory Visit: Payer: Self-pay

## 2020-10-05 VITALS — BP 118/74 | HR 78 | Temp 97.3°F | Ht 62.0 in | Wt 160.8 lb

## 2020-10-05 DIAGNOSIS — Z87891 Personal history of nicotine dependence: Secondary | ICD-10-CM

## 2020-10-05 DIAGNOSIS — R0609 Other forms of dyspnea: Secondary | ICD-10-CM

## 2020-10-05 DIAGNOSIS — R0600 Dyspnea, unspecified: Secondary | ICD-10-CM | POA: Diagnosis not present

## 2020-10-05 DIAGNOSIS — J432 Centrilobular emphysema: Secondary | ICD-10-CM | POA: Diagnosis not present

## 2020-10-05 LAB — PULMONARY FUNCTION TEST
DL/VA % pred: 94 %
DL/VA: 3.99 ml/min/mmHg/L
DLCO cor % pred: 97 %
DLCO cor: 17.83 ml/min/mmHg
DLCO unc % pred: 97 %
DLCO unc: 17.83 ml/min/mmHg
FEF 25-75 Post: 1.77 L/sec
FEF 25-75 Pre: 1.69 L/sec
FEF2575-%Change-Post: 4 %
FEF2575-%Pred-Post: 91 %
FEF2575-%Pred-Pre: 87 %
FEV1-%Change-Post: 1 %
FEV1-%Pred-Post: 96 %
FEV1-%Pred-Pre: 95 %
FEV1-Post: 2.1 L
FEV1-Pre: 2.07 L
FEV1FVC-%Change-Post: 2 %
FEV1FVC-%Pred-Pre: 99 %
FEV6-%Change-Post: -1 %
FEV6-%Pred-Post: 97 %
FEV6-%Pred-Pre: 99 %
FEV6-Post: 2.68 L
FEV6-Pre: 2.71 L
FEV6FVC-%Change-Post: 0 %
FEV6FVC-%Pred-Post: 104 %
FEV6FVC-%Pred-Pre: 104 %
FVC-%Change-Post: -1 %
FVC-%Pred-Post: 93 %
FVC-%Pred-Pre: 95 %
FVC-Post: 2.68 L
FVC-Pre: 2.72 L
Post FEV1/FVC ratio: 78 %
Post FEV6/FVC ratio: 100 %
Pre FEV1/FVC ratio: 76 %
Pre FEV6/FVC Ratio: 100 %
RV % pred: 96 %
RV: 1.95 L
TLC % pred: 100 %
TLC: 4.77 L

## 2020-10-05 NOTE — Patient Instructions (Addendum)
It is good to see you today Continue using the albuterol as needed for shortness of breath or wheezing. It is ok to stop taking the Anoro if it does not help. Pulmonary Function tests are normal, but may progress to obstructive changes with continued smoking Work on quitting smoking Call 1-800-Quit Now for free  gum or mints Consider ordering the fake cigarette I showed you  on line to help with the oral fixation and quitting smoking.  Low dose CT due 08/2021 Follow up as needed. Call us if you need [place]  Please contact office for sooner follow up if symptoms do not improve or worsen or seek emergency care

## 2020-10-05 NOTE — Progress Notes (Signed)
PFT done today. 

## 2020-10-05 NOTE — Progress Notes (Signed)
History of Present Illness Debra Trevino is a 65 y.o. female current every day smoker with mild dyspnea. She is followed buy Dr. Valeta Harms  Synopsis 65 year old female past medical history of hypertension, gastroesophageal reflux, gastric ulcer, carpal tunnel syndrome.  Patient last seen in the office by Eric Form, NP on 01/11/2020.  She is current every day smoker 50+ pack year history.  She is followed in our lung cancer screening program.  She does have shortness of breath and after discussion with the telephone visit regarding her lung cancer screening CT recommended evaluation by pulmonary.  From a respiratory standpoint she smokes a half a pack a day.  Does not have an albuterol inhaler and has never used one.  She does feel dyspneic on exertion when she exerts herself.  She plays tennis.  She does have labored breathing when she walks or climbs stairs.   10/05/2020 Pt. Presents for follow up. She had a low dose CT Chest for lung cancer screening. It was read as a Lung RADS 2: nodules that are benign in appearance and behavior with a very low likelihood of becoming a clinically active cancer due to size or lack of growth. Recommendation per radiology is for a repeat LDCT in 12 months. There was notation of mild diffuse bronchial wall thickening with mild centrilobar and paraseptal emphysema. She was concerned about these changes and wanted to have PFT's to evaluate for COPD. She was seen by Dr. Valeta Harms 02/24/2020. He started her on Anoro and scheduled PFT's .  She states she has not been using the Anoro as she has not needed it. She didn't feel it helped her. She is using the albuterol  about 2 times a week. She has not had to use this for a month or more. She does have nasal congestion. Nasal secretions are clear. She has been using saline spray which has helped. She cannot tolerate Flonase.   We discussed that her PFT's do not show obstruction, and that the changes of emphysema noted on CT are  due to smoking. She only smokes at night while her husband is smoking a cigar. She does want to quit. We discussed smoking cessation , and being aware that she smokes more out of habit than true nicotine need as she can go all day without a cigarette. She is going to check with her insurance to see if they would cover acupuncture for smoking cessation. We also discussed behavioral changes she can try to help her quit smoking. I gave her the  1-800-QUIT NOw card to call for free nicotine replacement therapy. I also provided her with a card with the number for smoking cessation classes through [facility] health.     Test Results:  LDCT 09/01/2020 Lung-RADS 1, negative. Continue annual screening with low-dose chest CT without contrast in 12 months. Mild diffuse bronchial wall thickening with mild centrilobular and paraseptal emphysema; imaging findings suggestive of underlying COPD            CBC Latest Ref Rng & Units 06/24/2019 10/09/2017 01/02/2016  WBC 4.0 - 10.5 K/uL 7.7 8.0 8.5  Hemoglobin 12.0 - 15.0 g/dL 14.4 14.6 13.8  Hematocrit 36.0 - 46.0 % 42.8 43.4 41.2  Platelets 150 - 400 K/uL 230 258 297    BMP Latest Ref Rng & Units 06/24/2019 05/18/2019 10/09/2017  Glucose 70 - 99 mg/dL 131(H) 118(H) 102(H)  BUN 8 - 23 mg/dL '21 18 17  '$ Creatinine 0.44 - 1.00 mg/dL 0.72 0.78 0.80  Sodium  135 - 145 mmol/L 140 136 141  Potassium 3.5 - 5.1 mmol/L 4.2 5.0 4.4  Chloride 98 - 111 mmol/L 105 104 107  CO2 22 - 32 mmol/L '23 29 26  '$ Calcium 8.9 - 10.3 mg/dL 9.2 9.6 9.3    BNP No results found for: BNP  ProBNP No results found for: PROBNP  PFT    Component Value Date/Time   FEV1PRE 2.07 10/05/2020 1507   FEV1POST 2.10 10/05/2020 1507   FVCPRE 2.72 10/05/2020 1507   FVCPOST 2.68 10/05/2020 1507   TLC 4.77 10/05/2020 1507   DLCOUNC 17.83 10/05/2020 1507   PREFEV1FVCRT 76 10/05/2020 1507   PSTFEV1FVCRT 78 10/05/2020 1507    MR CERVICAL SPINE WO CONTRAST  Result Date: 09/20/2020 CLINICAL  DATA:  Hyper reflexia. Bilateral carpal tunnel syndrome. Paresthesia of both feet. Spinal stenosis, cervical spine; cervical canal stenosis and hyper reflexia. Additional history provided by scanning technologist: Patient reports neck pain radiating into arms bilaterally (left greater than right) for 3 months. EXAM: MRI CERVICAL SPINE WITHOUT CONTRAST TECHNIQUE: Multiplanar, multisequence MR imaging of the cervical spine was performed. No intravenous contrast was administered. COMPARISON:  Cervical spine MRI 01/08/2006. FINDINGS: Alignment: Straightening of the expected cervical lordosis. Trace C3-C4 grade 1 anterolisthesis. 3 mm C5-C6 grade 1 anterolisthesis. Trace C7-T1 and T2-T3 grade 1 anterolisthesis. Vertebrae: Vertebral body height is maintained. Edema within the bilateral C5 and C6 articular pillars, likely degenerative. Degenerative endplate irregularity and trace degenerative endplate edema at [id number] and C6-C7. Elsewhere, no significant marrow edema or focal suspicious osseous lesion is identified. Cord: No spinal cord signal abnormality is identified. Posterior Fossa, vertebral arteries, paraspinal tissues: No abnormality identified within included portions of the posterior fossa. Flow voids preserved within the imaged cervical vertebral arteries. Paraspinal soft tissues within normal limits. Disc levels: Unless otherwise stated, the level by level findings below have not significantly changed from the prior MRI of 01/08/2006. Progressive multilevel disc degeneration. Most notably, there is progressive moderate disc degeneration at C5-C6 and C6-C7. C2-C3: Shallow disc bulge with superimposed small central disc protrusion. Mild uncovertebral hypertrophy on the left. Minimal partial effacement of the ventral thecal sac without spinal cord mass effect. No significant foraminal stenosis C3-C4: Trace grade 1 anterolisthesis, new from the prior exam. Slight disc uncovering. Mild facet arthrosis (greater on the  left), new from the prior exam. No significant spinal canal stenosis. Mild left neural foraminal narrowing, also new from the prior exam. C4-C5: Small central disc protrusion. Mild facet arthrosis. These findings are new as compared to the prior exam. The disc protrusion minimally effaces the ventral thecal sac without spinal cord mass effect. Mild relative right neural foraminal narrowing, new from the prior exam. C5-C6: 3 mm grade 1 anterolisthesis, new from the prior exam. Disc uncovering. Facet arthrosis (mild right, moderate/advanced left), new from the prior exam. No significant spinal canal stenosis. Bilateral neural foraminal narrowing (mild right, moderate left), new from the prior exam. C6-C7: Posterior disc osteophyte complex with bilateral uncovertebral hypertrophy, new from the prior exam. Mild partial effacement of ventral thecal sac without spinal cord mass effect. Moderate bilateral neural foraminal narrowing (greater on the left). C7-T1: Trace grade 1 anterolisthesis. Mild facet arthrosis. These findings are new from the prior exam. No significant disc herniation, spinal canal or foraminal stenosis. IMPRESSION: Cervical spondylosis, as outlined and having progressed at multiple levels since the prior MRI of 01/08/2006. No more than mild spinal canal narrowing at any level. Multilevel neural foraminal narrowing, greatest on the left at C5-C6  and bilaterally at C6-C7 (moderate in severity at these levels). Multilevel grade 1 spondylolisthesis. Most notably, 3 mm grade 1 anterolisthesis is now present at C5-C6. Edema within the bilateral C4 and C5 articular pillars, likely degenerative and related to facet arthrosis. Also of note, there is progressive moderate disc degeneration at C5-C6 and C6-C7. Electronically Signed   By: Debra Simmering DO   On: 09/20/2020 15:13     Past medical hx Past Medical History:  Diagnosis Date   Bilateral foot pain    morton neuroma, seeing podiatrist   CTS (carpal  tunnel syndrome)    has seen hand specilist   Depression 03/24/2007   Qualifier: Diagnosis of  By: Arnoldo Morale MD, Balinda Quails    Gastric ulcer    per patient   GERD (gastroesophageal reflux disease) 05/27/2014   Sees Dr. Earlean Shawl; hx erosive esophagitis   Hot flashes    recur when stops HRT   HTN (hypertension)    Low back pain    chronic   Migraines    resolved on HRT for hot flases   Muscle spasm    very active, muscle spasm after tennis matches     Social History   Tobacco Use   Smoking status: Every Day    Packs/day: 1.00    Years: 50.00    Pack years: 50.00    Types: Cigarettes   Smokeless tobacco: Never  Vaping Use   Vaping Use: Never used  Substance Use Topics   Alcohol use: Yes    Comment: several drinks every day   Drug use: No    Debra Trevino reports that she has been smoking cigarettes. She has a 50.00 pack-year smoking history. She has never used smokeless tobacco. She reports current alcohol use. She reports that she does not use drugs.  Tobacco Cessation: I have spent 4-5  minutes counseling patient on smoking cessation this visit. We have reviewed the risks of continued smoking on her current health situation. Patient verbalizes understanding of their  risks associated with continued smoking and the negative health consequences including worsening of COPD, risk of lung cancer , stroke and heart disease.     Past surgical hx, Family hx, Social hx all reviewed.  Current Outpatient Medications on File Prior to Visit  Medication Sig   citalopram (CELEXA) 20 MG tablet TAKE 1 TABLET BY MOUTH EVERY DAY   cyclobenzaprine (FLEXERIL) 10 MG tablet Take 10 mg by mouth 3 (three) times daily as needed for muscle spasms.   gabapentin (NEURONTIN) 100 MG capsule Take 100 mg by mouth at bedtime.    meloxicam (MOBIC) 15 MG tablet Take 15 mg by mouth daily.   Menthol-Methyl Salicylate (GRX ANALGESIC BALM) OINT Apply 1 application topically 3 (three) times daily as needed (pain).     Omeprazole 20 MG TBEC Take 20 mg by mouth daily.    albuterol (VENTOLIN HFA) 108 (90 Base) MCG/ACT inhaler Inhale 2 puffs into the lungs every 6 (six) hours as needed for wheezing or shortness of breath. (Patient not taking: Reported on 10/05/2020)   bisoprolol-hydrochlorothiazide (ZIAC) 2.5-6.25 MG tablet TAKE 1 TABLET BY MOUTH EVERY DAY (Patient not taking: Reported on 10/05/2020)   umeclidinium-vilanterol (ANORO ELLIPTA) 62.5-25 MCG/INH AEPB Inhale 1 puff into the lungs daily. (Patient not taking: Reported on 10/05/2020)   No current facility-administered medications on file prior to visit.     Allergies  Allergen Reactions   Penicillins Swelling    Has patient had a PCN reaction causing immediate rash, facial/tongue/throat  swelling, SOB or lightheadedness with hypotension:YES Has patient had a PCN reaction causing severe rash involving mucus membranes or skin necrosis: NO Has patient had a PCN reaction that required hospitalization NO Has patient had a PCN reaction occurring within the last 10 years: NO If all of the above answers are "NO", then may proceed with Cephalosporin use.    Review Of Systems:  Constitutional:   No  weight loss, night sweats,  Fevers, chills, fatigue, or  lassitude.  HEENT:   No headaches,  Difficulty swallowing,  Tooth/dental problems, or  Sore throat,                No sneezing, itching, ear ache, nasal congestion, post nasal drip,   CV:  No chest pain,  Orthopnea, PND, swelling in lower extremities, anasarca, dizziness, palpitations, syncope.   GI  No heartburn, indigestion, abdominal pain, nausea, vomiting, diarrhea, change in bowel habits, loss of appetite, bloody stools.   Resp: + mild  shortness of breath with exertion none  at rest.  No excess mucus, no productive cough,  No non-productive cough,  No coughing up of blood.  No change in color of mucus.  No wheezing.  No chest wall deformity  Skin: no rash or lesions.  GU: no dysuria, change in color  of urine, no urgency or frequency.  No flank pain, no hematuria   MS:  No joint pain or swelling.  No decreased range of motion.  No back pain.  Psych:  No change in mood or affect. No depression or anxiety.  No memory loss.   Vital Signs BP 118/74 (BP Location: Left Arm, Cuff Size: Normal)   Pulse 78   Temp (!) 97.3 F (36.3 C) (Oral)   Ht '5\' 2"'$  (1.575 m)   Wt 160 lb 12.8 oz (72.9 kg)   SpO2 98%   BMI 29.41 kg/m    Physical Exam:  General- No distress,  A&Ox3, pleasant ENT: No sinus tenderness, TM clear, pale nasal mucosa, no oral exudate,no post nasal drip, no LAN Cardiac: S1, S2, regular rate and rhythm, no murmur Chest: No wheeze/ rales/ dullness; no accessory muscle use, no nasal flaring, no sternal retractions Abd.: Soft Non-tender, ND, BS +, Body mass index is 29.41 kg/m. Ext: No clubbing cyanosis, edema, no obvious deformities Neuro:  normal strength, MAE x 4, A&O x 3, Appropriate Skin: No rashes, warm and dry Psych: normal mood and behavior   Assessment/Plan  Dyspnea on exertion Centrilobular Emphysema on CT imaging Tried Anoro and stopped>> no significant improvement with inhaler Plan Continue using the albuterol as needed for shortness of breath or wheezing. It is ok to stop taking the Anoro if it does not help. Pulmonary Function tests are normal, but may progress to obstructive changes with continued smoking  Tobacco Abuse Plan Work on quitting smoking Call 1-800-Quit Now for free  gum or mints Consider ordering the fake cigarette I showed you  on line to help with the oral fixation and quitting smoking.  Low dose CT due 08/2021 Follow up as needed. Call us if you need [place]  Please contact office for sooner follow up if symptoms do not improve or worsen or seek emergency care     I spent 30 minutes dedicated to the care of this patient on the date of this encounter to include pre-visit review of records, face-to-face time with the patient discussing  conditions above, post visit ordering of testing, clinical documentation with the electronic health record, making appropriate  referrals as documented, and communicating necessary information to the patient's healthcare team.   Magdalen Spatz, NP 10/05/2020  4:18 PM

## 2020-10-15 NOTE — Progress Notes (Signed)
PCCM: Thanks for seeing her Garner Nash, DO [facility] 10/15/2020 1:12 PM

## 2020-10-30 ENCOUNTER — Other Ambulatory Visit: Payer: Self-pay | Admitting: Family Medicine

## 2020-12-02 DIAGNOSIS — Z03818 Encounter for observation for suspected exposure to other biological agents ruled out: Secondary | ICD-10-CM | POA: Diagnosis not present

## 2020-12-02 DIAGNOSIS — Z20822 Contact with and (suspected) exposure to covid-19: Secondary | ICD-10-CM | POA: Diagnosis not present

## 2020-12-15 ENCOUNTER — Telehealth: Payer: Self-pay

## 2020-12-15 NOTE — Telephone Encounter (Signed)
Received pre-op forms from [facility]. Per Dr. Elease Hashimoto the patient will need a surgical clearance appointment. Spoke with the patient and the appointment has been scheduled. Forms will stay in the red folder on Tison Leibold's desk until appointment.

## 2020-12-26 ENCOUNTER — Other Ambulatory Visit: Payer: Self-pay

## 2020-12-26 ENCOUNTER — Ambulatory Visit (INDEPENDENT_AMBULATORY_CARE_PROVIDER_SITE_OTHER): Payer: Medicare Other | Admitting: Family Medicine

## 2020-12-26 VITALS — BP 128/64 | HR 68 | Temp 98.3°F | Ht 62.0 in | Wt 154.6 lb

## 2020-12-26 DIAGNOSIS — Z01818 Encounter for other preprocedural examination: Secondary | ICD-10-CM

## 2020-12-26 DIAGNOSIS — I10 Essential (primary) hypertension: Secondary | ICD-10-CM | POA: Diagnosis not present

## 2020-12-26 DIAGNOSIS — M179 Osteoarthritis of knee, unspecified: Secondary | ICD-10-CM

## 2020-12-26 DIAGNOSIS — Z23 Encounter for immunization: Secondary | ICD-10-CM | POA: Diagnosis not present

## 2020-12-26 NOTE — Patient Instructions (Signed)
Clarify if orthopedic office will be getting any pre-op labs

## 2020-12-26 NOTE — Progress Notes (Signed)
Established Patient Office Visit  Subjective:  Patient ID: Debra Trevino, female    DOB: April 01, 1955  Age: 65 y.o. MRN: [id number]  CC:  Chief Complaint  Patient presents with   surgical clearance    Knee Replacement for 1st week Dec.    HPI Debra Trevino presents for presurgical clearance for left total knee replacement early December.  Her chronic problems include history of hypertension, osteoarthritis, GERD, depression.  She does have long history of smoking.  She has been in the CT lung cancer screening program which did not show any evidence for atherosclerosis or coronary calcification.  She has no cardiac history.  She saw pulmonary.  She had pulmonary function test which did not show any obstruction.  She did have some changes of emphysema.  She was prescribed Anoro inhaler but never took it because of cost issues.  She has albuterol to use as needed.  Does have strong family history of CAD with 2 brothers having MI age 56.  Patient plays tennis couple times a week in spite of her knee arthritis and has never had any chest pain with exertion.  Currently smokes about 5 to 6 cigarettes/day. She has hypertension treated with Ziac.  Compliant with therapy.  No recent headaches.  Past Medical History:  Diagnosis Date   Bilateral foot pain    morton neuroma, seeing podiatrist   CTS (carpal tunnel syndrome)    has seen hand specilist   Depression 03/24/2007   Qualifier: Diagnosis of  By: Arnoldo Morale MD, Balinda Quails    Gastric ulcer    per patient   GERD (gastroesophageal reflux disease) 05/27/2014   Sees Dr. Earlean Shawl; hx erosive esophagitis   Hot flashes    recur when stops HRT   HTN (hypertension)    Low back pain    chronic   Migraines    resolved on HRT for hot flases   Muscle spasm    very active, muscle spasm after tennis matches    Past Surgical History:  Procedure Laterality Date   CARPAL TUNNEL RELEASE     right hand   MEDIAL PARTIAL KNEE REPLACEMENT  2017    Right knee   TONSILLECTOMY      Family History  Problem Relation Age of Onset   Charcot-Marie-Tooth disease Father    Renal cancer Mother    Schizophrenia Brother    Hypertension Other     Social History   Socioeconomic History   Marital status: Married    Spouse name: Not on file   Number of children: Not on file   Years of education: Not on file   Highest education level: Not on file  Occupational History   Not on file  Tobacco Use   Smoking status: Every Day    Packs/day: 1.00    Years: 50.00    Pack years: 50.00    Types: Cigarettes   Smokeless tobacco: Never  Vaping Use   Vaping Use: Never used  Substance and Sexual Activity   Alcohol use: Yes    Comment: several drinks every day   Drug use: No   Sexual activity: Not on file  Other Topics Concern   Not on file  Social History Narrative   Work or School: 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      Home Situation: live with husband - boys off at college      Spiritual Beliefs: catholic      Lifestyle: lots of exercise -  tennis; diet is healthy      Write with her left hand and does everything else with her right hand       Lives in a two story home    Social Determinants of Health   Financial Resource Strain: Low Risk    Difficulty of Paying Living Expenses: Not hard at all  Food Insecurity: No Food Insecurity   Worried About Charity fundraiser in the Last Year: Never true   Arboriculturist in the Last Year: Never true  Transportation Needs: No Transportation Needs   Lack of Transportation (Medical): No   Lack of Transportation (Non-Medical): No  Physical Activity: Sufficiently Active   Days of Exercise per Week: 5 days   Minutes of Exercise per Session: 60 min  Stress: No Stress Concern Present   Feeling of Stress : Not at all  Social Connections: Moderately Isolated   Frequency of Communication with Friends and Family: More than three times a week   Frequency of Social Gatherings with  Friends and Family: Three times a week   Attends Religious Services: Never   Active Member of Clubs or Organizations: No   Attends Archivist Meetings: Never   Marital Status: Married  Human resources officer Violence: Not At Risk   Fear of Current or Ex-Partner: No   Emotionally Abused: No   Physically Abused: No   Sexually Abused: No    Outpatient Medications Prior to Visit  Medication Sig Dispense Refill   albuterol (VENTOLIN HFA) 108 (90 Base) MCG/ACT inhaler Inhale 2 puffs into the lungs every 6 (six) hours as needed for wheezing or shortness of breath. 8 g 6   bisoprolol-hydrochlorothiazide (ZIAC) 2.5-6.25 MG tablet TAKE 1 TABLET BY MOUTH EVERY DAY 90 tablet 3   citalopram (CELEXA) 20 MG tablet TAKE 1 TABLET BY MOUTH EVERY DAY 90 tablet 0   cyclobenzaprine (FLEXERIL) 10 MG tablet Take 10 mg by mouth 3 (three) times daily as needed for muscle spasms.     gabapentin (NEURONTIN) 100 MG capsule Take 100 mg by mouth at bedtime.      meloxicam (MOBIC) 15 MG tablet Take 15 mg by mouth daily.     Menthol-Methyl Salicylate (GRX ANALGESIC BALM) OINT Apply 1 application topically 3 (three) times daily as needed (pain).      Omeprazole 20 MG TBEC Take 20 mg by mouth daily.   3   umeclidinium-vilanterol (ANORO ELLIPTA) 62.5-25 MCG/INH AEPB Inhale 1 puff into the lungs daily. 2 each 0   No facility-administered medications prior to visit.    Allergies  Allergen Reactions   Penicillins Swelling    Has patient had a PCN reaction causing immediate rash, facial/tongue/throat swelling, SOB or lightheadedness with hypotension:YES Has patient had a PCN reaction causing severe rash involving mucus membranes or skin necrosis: NO Has patient had a PCN reaction that required hospitalization NO Has patient had a PCN reaction occurring within the last 10 years: NO If all of the above answers are "NO", then may proceed with Cephalosporin use.    ROS Review of Systems  Constitutional:  Negative  for fatigue and unexpected weight change.  Eyes:  Negative for visual disturbance.  Respiratory:  Negative for cough, chest tightness, shortness of breath and wheezing.   Cardiovascular:  Negative for chest pain, palpitations and leg swelling.  Neurological:  Negative for dizziness, seizures, syncope, weakness, light-headedness and headaches.     Objective:    Physical Exam Constitutional:  Appearance: She is well-developed.  Eyes:     Pupils: Pupils are equal, round, and reactive to light.  Neck:     Thyroid: No thyromegaly.     Vascular: No JVD.  Cardiovascular:     Rate and Rhythm: Normal rate and regular rhythm.     Heart sounds:    No gallop.  Pulmonary:     Effort: Pulmonary effort is normal. No respiratory distress.     Breath sounds: Normal breath sounds. No wheezing or rales.  Musculoskeletal:     Cervical back: Neck supple.     Right lower leg: No edema.     Left lower leg: No edema.  Neurological:     Mental Status: She is alert.    BP 128/64 (BP Location: Left Arm, Patient Position: Sitting, Cuff Size: Normal)   Pulse 68   Temp 98.3 F (36.8 C) (Oral)   Ht 5\' 2"  (1.575 m)   Wt 154 lb 9.6 oz (70.1 kg)   SpO2 96%   BMI 28.28 kg/m  Wt Readings from Last 3 Encounters:  12/26/20 154 lb 9.6 oz (70.1 kg)  10/05/20 160 lb 12.8 oz (72.9 kg)  09/08/20 160 lb (72.6 kg)     Health Maintenance Due  Topic Date Due   Zoster Vaccines- Shingrix (1 of 2) Never done   DEXA SCAN  Never done    There are no preventive care reminders to display for this patient.  Lab Results  Component Value Date   TSH 1.50 06/26/2019   Lab Results  Component Value Date   WBC 7.7 06/24/2019   HGB 14.4 06/24/2019   HCT 42.8 06/24/2019   MCV 93.9 06/24/2019   PLT 230 06/24/2019   Lab Results  Component Value Date   NA 140 06/24/2019   K 4.2 06/24/2019   CO2 23 06/24/2019   GLUCOSE 131 (H) 06/24/2019   BUN 21 06/24/2019   CREATININE 0.72 06/24/2019   BILITOT 0.2 (L)  11/27/2012   ALKPHOS 59 11/27/2012   AST 18 11/27/2012   ALT 19 11/27/2012   PROT 6.3 11/27/2012   ALBUMIN 3.5 11/27/2012   CALCIUM 9.2 06/24/2019   ANIONGAP 12 06/24/2019   GFR 73.92 05/18/2019   Lab Results  Component Value Date   CHOL 185 08/15/2018   Lab Results  Component Value Date   HDL 49 08/15/2018   Lab Results  Component Value Date   LDLCALC 87 08/15/2018   Lab Results  Component Value Date   TRIG 244 (H) 08/15/2018   Lab Results  Component Value Date   CHOLHDL 3.8 08/15/2018   Lab Results  Component Value Date   HGBA1C 6.0 05/18/2019      Assessment & Plan:   Presurgical clearance.  Patient does have emphysema changes from previous pulmonary function test but not limiting.  No obstructive findings on previous PFTs.  Plays tennis but not at a high exertion level without any difficulties in terms of any chest pain.  Her blood pressure is well controlled  -We feel she is low risk for surgery at this time. -She will discuss with orthopedic practice preoperative labs -Strongly advised to stop smoking in preparation for her surgery -Flu vaccine given -Continue bisoprolol HCTZ for blood pressure   No orders of the defined types were placed in this encounter.   Follow-up: No follow-ups on file.    Carolann Littler, MD

## 2021-01-17 DIAGNOSIS — Z0189 Encounter for other specified special examinations: Secondary | ICD-10-CM | POA: Diagnosis not present

## 2021-01-17 DIAGNOSIS — Z5181 Encounter for therapeutic drug level monitoring: Secondary | ICD-10-CM | POA: Diagnosis not present

## 2021-02-13 ENCOUNTER — Other Ambulatory Visit: Payer: Self-pay | Admitting: Family Medicine

## 2021-02-14 DIAGNOSIS — G8918 Other acute postprocedural pain: Secondary | ICD-10-CM | POA: Diagnosis not present

## 2021-02-14 DIAGNOSIS — M1712 Unilateral primary osteoarthritis, left knee: Secondary | ICD-10-CM | POA: Diagnosis not present

## 2021-02-17 DIAGNOSIS — M25662 Stiffness of left knee, not elsewhere classified: Secondary | ICD-10-CM | POA: Diagnosis not present

## 2021-02-17 DIAGNOSIS — M25562 Pain in left knee: Secondary | ICD-10-CM | POA: Diagnosis not present

## 2021-02-20 DIAGNOSIS — M25562 Pain in left knee: Secondary | ICD-10-CM | POA: Diagnosis not present

## 2021-02-20 DIAGNOSIS — M25662 Stiffness of left knee, not elsewhere classified: Secondary | ICD-10-CM | POA: Diagnosis not present

## 2021-02-22 DIAGNOSIS — M25562 Pain in left knee: Secondary | ICD-10-CM | POA: Diagnosis not present

## 2021-02-22 DIAGNOSIS — M25662 Stiffness of left knee, not elsewhere classified: Secondary | ICD-10-CM | POA: Diagnosis not present

## 2021-02-24 DIAGNOSIS — M25562 Pain in left knee: Secondary | ICD-10-CM | POA: Diagnosis not present

## 2021-02-24 DIAGNOSIS — M25662 Stiffness of left knee, not elsewhere classified: Secondary | ICD-10-CM | POA: Diagnosis not present

## 2021-03-01 DIAGNOSIS — M25662 Stiffness of left knee, not elsewhere classified: Secondary | ICD-10-CM | POA: Diagnosis not present

## 2021-03-01 DIAGNOSIS — M25562 Pain in left knee: Secondary | ICD-10-CM | POA: Diagnosis not present

## 2021-03-03 DIAGNOSIS — M25662 Stiffness of left knee, not elsewhere classified: Secondary | ICD-10-CM | POA: Diagnosis not present

## 2021-03-03 DIAGNOSIS — M25562 Pain in left knee: Secondary | ICD-10-CM | POA: Diagnosis not present

## 2021-03-07 DIAGNOSIS — M25662 Stiffness of left knee, not elsewhere classified: Secondary | ICD-10-CM | POA: Diagnosis not present

## 2021-03-16 DIAGNOSIS — M25562 Pain in left knee: Secondary | ICD-10-CM | POA: Diagnosis not present

## 2021-03-16 DIAGNOSIS — M25662 Stiffness of left knee, not elsewhere classified: Secondary | ICD-10-CM | POA: Diagnosis not present

## 2021-03-21 DIAGNOSIS — M25662 Stiffness of left knee, not elsewhere classified: Secondary | ICD-10-CM | POA: Diagnosis not present

## 2021-03-23 DIAGNOSIS — M25662 Stiffness of left knee, not elsewhere classified: Secondary | ICD-10-CM | POA: Diagnosis not present

## 2021-03-27 DIAGNOSIS — R0981 Nasal congestion: Secondary | ICD-10-CM | POA: Diagnosis not present

## 2021-03-27 DIAGNOSIS — H66001 Acute suppurative otitis media without spontaneous rupture of ear drum, right ear: Secondary | ICD-10-CM | POA: Diagnosis not present

## 2021-03-30 DIAGNOSIS — M25662 Stiffness of left knee, not elsewhere classified: Secondary | ICD-10-CM | POA: Diagnosis not present

## 2021-04-04 DIAGNOSIS — M25662 Stiffness of left knee, not elsewhere classified: Secondary | ICD-10-CM | POA: Diagnosis not present

## 2021-04-04 DIAGNOSIS — M25562 Pain in left knee: Secondary | ICD-10-CM | POA: Diagnosis not present

## 2021-04-06 DIAGNOSIS — M25562 Pain in left knee: Secondary | ICD-10-CM | POA: Diagnosis not present

## 2021-04-11 DIAGNOSIS — M25562 Pain in left knee: Secondary | ICD-10-CM | POA: Diagnosis not present

## 2021-04-11 DIAGNOSIS — M25662 Stiffness of left knee, not elsewhere classified: Secondary | ICD-10-CM | POA: Diagnosis not present

## 2021-04-12 ENCOUNTER — Ambulatory Visit (INDEPENDENT_AMBULATORY_CARE_PROVIDER_SITE_OTHER): Payer: Medicare Other | Admitting: Family Medicine

## 2021-04-12 VITALS — BP 124/68 | HR 80 | Temp 98.4°F | Wt 150.9 lb

## 2021-04-12 DIAGNOSIS — H8112 Benign paroxysmal vertigo, left ear: Secondary | ICD-10-CM

## 2021-04-12 NOTE — Patient Instructions (Signed)
If symptoms not resolving in 2-3 days with the Epley maneuvers let me know.

## 2021-04-12 NOTE — Progress Notes (Signed)
Established Patient Office Visit  Subjective:  Patient ID: Debra Trevino, female    DOB: 12-Sep-1955  Age: 66 y.o. MRN: [id number]  CC:  Chief Complaint  Patient presents with   Follow-up    Vertigo, DX ear infection, taken antibiotic and prednisone but thinks it has come back    HPI Debra Trevino presents for follow-up from recent visit [facility].  She states for several days prior to then she developed some dizziness.  This was noted when she rolled over in bed.  She recalls rolling over to the left side and triggering dizziness.  Denied any orthostatic or lightheaded symptoms.  She describes classic vertigo type symptoms.  No nausea or vomiting.  No headache.  She did develop some right ear pain prior to her visit [facility] and apparently was diagnosed with a "ear infection".  She was treated with Zithromax and prednisone taper.  Her ear symptoms did improve some.  She denies any prior history of vertigo.  She thinks her vertigo has been bidirectional but only triggered with movement to the left today.  Denies any acute hearing changes.  No fevers or chills.  No tinnitus.  No recent visual changes.  No ataxia.  No speech changes.  No focal weakness.  Her dizziness has persisted almost daily and clearly worse with head movement.  Past Medical History:  Diagnosis Date   Bilateral foot pain    morton neuroma, seeing podiatrist   CTS (carpal tunnel syndrome)    has seen hand specilist   Depression 03/24/2007   Qualifier: Diagnosis of  By: Arnoldo Morale MD, Balinda Quails    Gastric ulcer    per patient   GERD (gastroesophageal reflux disease) 05/27/2014   Sees Dr. Earlean Shawl; hx erosive esophagitis   Hot flashes    recur when stops HRT   HTN (hypertension)    Low back pain    chronic   Migraines    resolved on HRT for hot flases   Muscle spasm    very active, muscle spasm after tennis matches    Past Surgical History:  Procedure Laterality Date    CARPAL TUNNEL RELEASE     right hand   MEDIAL PARTIAL KNEE REPLACEMENT  2017   Right knee   TONSILLECTOMY      Family History  Problem Relation Age of Onset   Charcot-Marie-Tooth disease Father    Renal cancer Mother    Schizophrenia Brother    Hypertension Other     Social History   Socioeconomic History   Marital status: Married    Spouse name: Not on file   Number of children: Not on file   Years of education: Not on file   Highest education level: Not on file  Occupational History   Not on file  Tobacco Use   Smoking status: Every Day    Packs/day: 1.00    Years: 50.00    Pack years: 50.00    Types: Cigarettes   Smokeless tobacco: Never  Vaping Use   Vaping Use: Never used  Substance and Sexual Activity   Alcohol use: Yes    Comment: several drinks every day   Drug use: No   Sexual activity: Not on file  Other Topics Concern   Not on file  Social History Narrative   Work or School: 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      Home Situation: live with husband -  boys off at college      Spiritual Beliefs: catholic      Lifestyle: lots of exercise - tennis; diet is healthy      Write with her left hand and does everything else with her right hand       Lives in a two story home    Social Determinants of Health   Financial Resource Strain: Low Risk    Difficulty of Paying Living Expenses: Not hard at all  Food Insecurity: No Food Insecurity   Worried About Charity fundraiser in the Last Year: Never true   Arboriculturist in the Last Year: Never true  Transportation Needs: No Transportation Needs   Lack of Transportation (Medical): No   Lack of Transportation (Non-Medical): No  Physical Activity: Sufficiently Active   Days of Exercise per Week: 5 days   Minutes of Exercise per Session: 60 min  Stress: No Stress Concern Present   Feeling of Stress : Not at all  Social Connections: Moderately Isolated   Frequency of Communication with Friends  and Family: More than three times a week   Frequency of Social Gatherings with Friends and Family: Three times a week   Attends Religious Services: Never   Active Member of Clubs or Organizations: No   Attends Archivist Meetings: Never   Marital Status: Married  Human resources officer Violence: Not At Risk   Fear of Current or Ex-Partner: No   Emotionally Abused: No   Physically Abused: No   Sexually Abused: No    Outpatient Medications Prior to Visit  Medication Sig Dispense Refill   albuterol (VENTOLIN HFA) 108 (90 Base) MCG/ACT inhaler Inhale 2 puffs into the lungs every 6 (six) hours as needed for wheezing or shortness of breath. 8 g 6   bisoprolol-hydrochlorothiazide (ZIAC) 2.5-6.25 MG tablet TAKE 1 TABLET BY MOUTH EVERY DAY 90 tablet 1   citalopram (CELEXA) 20 MG tablet TAKE 1 TABLET BY MOUTH EVERY DAY 90 tablet 0   cyclobenzaprine (FLEXERIL) 10 MG tablet Take 10 mg by mouth 3 (three) times daily as needed for muscle spasms.     gabapentin (NEURONTIN) 100 MG capsule Take 100 mg by mouth at bedtime.      meloxicam (MOBIC) 15 MG tablet Take 15 mg by mouth daily.     Menthol-Methyl Salicylate (GRX ANALGESIC BALM) OINT Apply 1 application topically 3 (three) times daily as needed (pain).      Omeprazole 20 MG TBEC Take 20 mg by mouth daily.   3   umeclidinium-vilanterol (ANORO ELLIPTA) 62.5-25 MCG/INH AEPB Inhale 1 puff into the lungs daily. 2 each 0   No facility-administered medications prior to visit.    Allergies  Allergen Reactions   Penicillins Swelling    Has patient had a PCN reaction causing immediate rash, facial/tongue/throat swelling, SOB or lightheadedness with hypotension:YES Has patient had a PCN reaction causing severe rash involving mucus membranes or skin necrosis: NO Has patient had a PCN reaction that required hospitalization NO Has patient had a PCN reaction occurring within the last 10 years: NO If all of the above answers are "NO", then may proceed  with Cephalosporin use.    ROS Review of Systems  Constitutional:  Negative for chills and fever.  Respiratory:  Negative for cough and shortness of breath.   Cardiovascular:  Negative for chest pain.  Gastrointestinal:  Negative for abdominal pain.  Neurological:  Positive for dizziness. Negative for tremors, seizures, syncope, facial asymmetry, speech difficulty,  weakness and headaches.  Psychiatric/Behavioral:  Negative for confusion.      Objective:    Physical Exam Vitals reviewed.  Constitutional:      Appearance: Normal appearance.  Cardiovascular:     Rate and Rhythm: Normal rate and regular rhythm.  Musculoskeletal:     Cervical back: Neck supple.  Lymphadenopathy:     Cervical: No cervical adenopathy.  Neurological:     General: No focal deficit present.     Mental Status: She is alert and oriented to person, place, and time.     Cranial Nerves: No cranial nerve deficit.     Motor: No weakness.     Coordination: Coordination normal.     Gait: Gait normal.     Comments: Negative skew test (no vertical drift).  No vertical nystagmus. Normal finger-to-nose testing    BP 124/68 (BP Location: Left Arm, Patient Position: Sitting, Cuff Size: Normal)    Pulse 80    Temp 98.4 F (36.9 C) (Oral)    Wt 150 lb 14.4 oz (68.4 kg)    SpO2 99%    BMI 27.60 kg/m  Wt Readings from Last 3 Encounters:  04/12/21 150 lb 14.4 oz (68.4 kg)  12/26/20 154 lb 9.6 oz (70.1 kg)  10/05/20 160 lb 12.8 oz (72.9 kg)     Health Maintenance Due  Topic Date Due   Zoster Vaccines- Shingrix (1 of 2) Never done   DEXA SCAN  Never done   COVID-19 Vaccine (5 - Booster for Pfizer series) 10/17/2020    There are no preventive care reminders to display for this patient.  Lab Results  Component Value Date   TSH 1.50 06/26/2019   Lab Results  Component Value Date   WBC 7.7 06/24/2019   HGB 14.4 06/24/2019   HCT 42.8 06/24/2019   MCV 93.9 06/24/2019   PLT 230 06/24/2019   Lab Results   Component Value Date   NA 140 06/24/2019   K 4.2 06/24/2019   CO2 23 06/24/2019   GLUCOSE 131 (H) 06/24/2019   BUN 21 06/24/2019   CREATININE 0.72 06/24/2019   BILITOT 0.2 (L) 11/27/2012   ALKPHOS 59 11/27/2012   AST 18 11/27/2012   ALT 19 11/27/2012   PROT 6.3 11/27/2012   ALBUMIN 3.5 11/27/2012   CALCIUM 9.2 06/24/2019   ANIONGAP 12 06/24/2019   GFR 73.92 05/18/2019   Lab Results  Component Value Date   CHOL 185 08/15/2018   Lab Results  Component Value Date   HDL 49 08/15/2018   Lab Results  Component Value Date   LDLCALC 87 08/15/2018   Lab Results  Component Value Date   TRIG 244 (H) 08/15/2018   Lab Results  Component Value Date   CHOLHDL 3.8 08/15/2018   Lab Results  Component Value Date   HGBA1C 6.0 05/18/2019      Assessment & Plan:   Couple week history of vertigo.  Symptoms today triggered with head tilted 45 degrees to the left with lying back quickly.  The symptoms were not triggered when looking to the right.  Nonfocal neuro exam.  Suspect benign peripheral positional vertigo.  -We discussed signs and symptoms of more worrisome vertigo. -Recommend trial of Epley maneuvers with handout given and instructions given. -We recommend that she be in touch in the next 2 to 3 days if symptoms not fully resolving with Epley maneuvers.  We spent 30 minutes reviewing [facility], discussing symptoms, examining, and discussing treatment plan and  things to watch out for  No orders of the defined types were placed in this encounter.   Follow-up: No follow-ups on file.    Carolann Littler, MD

## 2021-04-13 DIAGNOSIS — M25662 Stiffness of left knee, not elsewhere classified: Secondary | ICD-10-CM | POA: Diagnosis not present

## 2021-04-18 DIAGNOSIS — Z471 Aftercare following joint replacement surgery: Secondary | ICD-10-CM | POA: Diagnosis not present

## 2021-04-18 DIAGNOSIS — Z4789 Encounter for other orthopedic aftercare: Secondary | ICD-10-CM | POA: Diagnosis not present

## 2021-04-18 DIAGNOSIS — M25662 Stiffness of left knee, not elsewhere classified: Secondary | ICD-10-CM | POA: Diagnosis not present

## 2021-04-18 DIAGNOSIS — M179 Osteoarthritis of knee, unspecified: Secondary | ICD-10-CM | POA: Diagnosis not present

## 2021-04-20 ENCOUNTER — Encounter (HOSPITAL_COMMUNITY): Payer: Self-pay | Admitting: Emergency Medicine

## 2021-04-20 ENCOUNTER — Other Ambulatory Visit: Payer: Self-pay

## 2021-04-20 ENCOUNTER — Emergency Department (HOSPITAL_COMMUNITY): Payer: Medicare Other

## 2021-04-20 ENCOUNTER — Observation Stay (HOSPITAL_COMMUNITY)
Admission: EM | Admit: 2021-04-20 | Discharge: 2021-04-21 | Disposition: A | Discharge status: Home / Self Care | Payer: Medicare Other | Attending: Internal Medicine | Admitting: Internal Medicine

## 2021-04-20 DIAGNOSIS — F17200 Nicotine dependence, unspecified, uncomplicated: Secondary | ICD-10-CM | POA: Diagnosis present

## 2021-04-20 DIAGNOSIS — R29818 Other symptoms and signs involving the nervous system: Secondary | ICD-10-CM | POA: Diagnosis not present

## 2021-04-20 DIAGNOSIS — Z96651 Presence of right artificial knee joint: Secondary | ICD-10-CM | POA: Diagnosis not present

## 2021-04-20 DIAGNOSIS — Z79899 Other long term (current) drug therapy: Secondary | ICD-10-CM | POA: Diagnosis not present

## 2021-04-20 DIAGNOSIS — D329 Benign neoplasm of meninges, unspecified: Secondary | ICD-10-CM | POA: Diagnosis present

## 2021-04-20 DIAGNOSIS — J449 Chronic obstructive pulmonary disease, unspecified: Secondary | ICD-10-CM | POA: Diagnosis not present

## 2021-04-20 DIAGNOSIS — R270 Ataxia, unspecified: Principal | ICD-10-CM

## 2021-04-20 DIAGNOSIS — K219 Gastro-esophageal reflux disease without esophagitis: Secondary | ICD-10-CM | POA: Insufficient documentation

## 2021-04-20 DIAGNOSIS — T40711A Poisoning by cannabis, accidental (unintentional), initial encounter: Secondary | ICD-10-CM | POA: Diagnosis not present

## 2021-04-20 DIAGNOSIS — Z20822 Contact with and (suspected) exposure to covid-19: Secondary | ICD-10-CM | POA: Insufficient documentation

## 2021-04-20 DIAGNOSIS — Z87891 Personal history of nicotine dependence: Secondary | ICD-10-CM | POA: Diagnosis not present

## 2021-04-20 DIAGNOSIS — R404 Transient alteration of awareness: Secondary | ICD-10-CM | POA: Diagnosis not present

## 2021-04-20 DIAGNOSIS — M25562 Pain in left knee: Secondary | ICD-10-CM | POA: Diagnosis not present

## 2021-04-20 DIAGNOSIS — I10 Essential (primary) hypertension: Secondary | ICD-10-CM | POA: Diagnosis not present

## 2021-04-20 DIAGNOSIS — M25662 Stiffness of left knee, not elsewhere classified: Secondary | ICD-10-CM | POA: Diagnosis not present

## 2021-04-20 DIAGNOSIS — H8110 Benign paroxysmal vertigo, unspecified ear: Secondary | ICD-10-CM | POA: Insufficient documentation

## 2021-04-20 DIAGNOSIS — R42 Dizziness and giddiness: Secondary | ICD-10-CM | POA: Diagnosis not present

## 2021-04-20 DIAGNOSIS — I629 Nontraumatic intracranial hemorrhage, unspecified: Secondary | ICD-10-CM | POA: Diagnosis not present

## 2021-04-20 LAB — CBC WITH DIFFERENTIAL/PLATELET
Abs Immature Granulocytes: 0.03 10*3/uL (ref 0.00–0.07)
Basophils Absolute: 0 10*3/uL (ref 0.0–0.1)
Basophils Relative: 0 %
Eosinophils Absolute: 0.4 10*3/uL (ref 0.0–0.5)
Eosinophils Relative: 4 %
HCT: 37.2 % (ref 36.0–46.0)
Hemoglobin: 12.7 g/dL (ref 12.0–15.0)
Immature Granulocytes: 0 %
Lymphocytes Relative: 45 %
Lymphs Abs: 4.2 10*3/uL — ABNORMAL HIGH (ref 0.7–4.0)
MCH: 32.1 pg (ref 26.0–34.0)
MCHC: 34.1 g/dL (ref 30.0–36.0)
MCV: 93.9 fL (ref 80.0–100.0)
Monocytes Absolute: 0.7 10*3/uL (ref 0.1–1.0)
Monocytes Relative: 7 %
Neutro Abs: 4.3 10*3/uL (ref 1.7–7.7)
Neutrophils Relative %: 44 %
Platelets: 257 10*3/uL (ref 150–400)
RBC: 3.96 MIL/uL (ref 3.87–5.11)
RDW: 14.5 % (ref 11.5–15.5)
WBC: 9.7 10*3/uL (ref 4.0–10.5)
nRBC: 0 % (ref 0.0–0.2)

## 2021-04-20 LAB — URINALYSIS, ROUTINE W REFLEX MICROSCOPIC
Bilirubin Urine: NEGATIVE
Glucose, UA: NEGATIVE mg/dL
Ketones, ur: NEGATIVE mg/dL
Nitrite: NEGATIVE
Protein, ur: NEGATIVE mg/dL
Specific Gravity, Urine: 1.008 (ref 1.005–1.030)
pH: 5 (ref 5.0–8.0)

## 2021-04-20 LAB — BASIC METABOLIC PANEL
Anion gap: 10 (ref 5–15)
BUN: 23 mg/dL (ref 8–23)
CO2: 23 mmol/L (ref 22–32)
Calcium: 9.2 mg/dL (ref 8.9–10.3)
Chloride: 107 mmol/L (ref 98–111)
Creatinine, Ser: 0.87 mg/dL (ref 0.44–1.00)
GFR, Estimated: >60 mL/min (ref >60)
Glucose, Bld: 155 mg/dL — ABNORMAL HIGH (ref 70–99)
Potassium: 4.2 mmol/L (ref 3.5–5.1)
Sodium: 140 mmol/L (ref 135–145)

## 2021-04-20 LAB — RESP PANEL BY RT-PCR (FLU A&B, COVID) ARPGX2
Influenza A by PCR: NEGATIVE
Influenza B by PCR: NEGATIVE
SARS Coronavirus 2 by RT PCR: NEGATIVE

## 2021-04-20 LAB — PROTIME-INR
INR: 1 (ref 0.8–1.2)
Prothrombin Time: 12.9 seconds (ref 11.4–15.2)

## 2021-04-20 LAB — ETHANOL: Alcohol, Ethyl (B): 120 mg/dL — ABNORMAL HIGH (ref <10)

## 2021-04-20 MED ORDER — NITROFURANTOIN MONOHYD MACRO 100 MG PO CAPS
100.0000 mg | ORAL_CAPSULE | Freq: Once | ORAL | Status: AC
  Administered 2021-04-21: 100 mg via ORAL
  Filled 2021-04-20: qty 1

## 2021-04-20 NOTE — ED Provider Triage Note (Signed)
Emergency Medicine Provider Triage Evaluation Note  Debra Trevino , a 66 y.o. female  was evaluated in triage.  Pt complains of change in gait, onset 1 hour ago. Admits to 1 liquor drink tonight and states she is a light weight. Denies unilateral weakness or numbness   Review of Systems  Positive: Change in gait  Negative: Changes in vision, speech  Physical Exam  BP 127/88 (BP Location: Left Leg)    Pulse 98    Temp 97.7 F (36.5 C) (Oral)    Resp 18    SpO2 99%  Gen:   Awake, no distress   Resp:  Normal effort  MSK:   Moves extremities without difficulty  Other:  No unilateral weakness or numbness.  No arm drift.  Question slight asymmetry to the face however vague.  Medical Decision Making  Medically screening exam initiated at 9:58 PM.  Appropriate orders placed.  Debra Trevino was informed that the remainder of the evaluation will be completed by another provider, this initial triage assessment does not replace that evaluation, and the importance of remaining in the ED until their evaluation is complete.  Called to Dr. Roslynn Amble, ER attending for further assistance and screening.   Tacy Learn, PA-C 04/20/21 2200

## 2021-04-20 NOTE — ED Triage Notes (Signed)
Pt brought to ED by spouse for evaluation of stroke symptoms. LKW approximately 1 hr ago. 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  States she suddenly became dizzy, has tremors. Pt appears to have delayed responses and face appears slightly asymmetrical.

## 2021-04-20 NOTE — ED Notes (Signed)
Pt ambulatory in hallway with 2 person assist. Pt reporting dizziness and unsteadiness. Pt was able to walk while holding onto this RN and husband for support. EDP notified.

## 2021-04-21 ENCOUNTER — Observation Stay (HOSPITAL_COMMUNITY): Payer: Medicare Other

## 2021-04-21 ENCOUNTER — Encounter (HOSPITAL_COMMUNITY): Payer: Self-pay | Admitting: Internal Medicine

## 2021-04-21 ENCOUNTER — Emergency Department (HOSPITAL_COMMUNITY): Payer: Medicare Other

## 2021-04-21 DIAGNOSIS — R404 Transient alteration of awareness: Secondary | ICD-10-CM

## 2021-04-21 DIAGNOSIS — R270 Ataxia, unspecified: Principal | ICD-10-CM

## 2021-04-21 DIAGNOSIS — R29818 Other symptoms and signs involving the nervous system: Secondary | ICD-10-CM | POA: Diagnosis not present

## 2021-04-21 DIAGNOSIS — D329 Benign neoplasm of meninges, unspecified: Secondary | ICD-10-CM | POA: Diagnosis present

## 2021-04-21 DIAGNOSIS — J449 Chronic obstructive pulmonary disease, unspecified: Secondary | ICD-10-CM | POA: Diagnosis present

## 2021-04-21 DIAGNOSIS — R42 Dizziness and giddiness: Secondary | ICD-10-CM | POA: Diagnosis not present

## 2021-04-21 DIAGNOSIS — M2578 Osteophyte, vertebrae: Secondary | ICD-10-CM | POA: Diagnosis not present

## 2021-04-21 DIAGNOSIS — R220 Localized swelling, mass and lump, head: Secondary | ICD-10-CM | POA: Diagnosis not present

## 2021-04-21 DIAGNOSIS — F17200 Nicotine dependence, unspecified, uncomplicated: Secondary | ICD-10-CM | POA: Diagnosis present

## 2021-04-21 DIAGNOSIS — I629 Nontraumatic intracranial hemorrhage, unspecified: Secondary | ICD-10-CM | POA: Diagnosis not present

## 2021-04-21 LAB — RAPID URINE DRUG SCREEN, HOSP PERFORMED
Amphetamines: NOT DETECTED
Barbiturates: NOT DETECTED
Benzodiazepines: NOT DETECTED
Cocaine: NOT DETECTED
Opiates: NOT DETECTED
Tetrahydrocannabinol: POSITIVE — AB

## 2021-04-21 LAB — VITAMIN B12: Vitamin B-12: 247 pg/mL (ref 180–914)

## 2021-04-21 LAB — FOLATE: Folate: 4.6 ng/mL — ABNORMAL LOW (ref >5.9)

## 2021-04-21 MED ORDER — PANTOPRAZOLE SODIUM 40 MG PO TBEC
40.0000 mg | DELAYED_RELEASE_TABLET | Freq: Every day | ORAL | Status: DC
  Administered 2021-04-21: 40 mg via ORAL
  Filled 2021-04-21: qty 1

## 2021-04-21 MED ORDER — ACETAMINOPHEN 160 MG/5ML PO SOLN: 650.0000 mg | ORAL | Status: DC | PRN

## 2021-04-21 MED ORDER — LACTATED RINGERS IV BOLUS
1000.0000 mL | Freq: Once | INTRAVENOUS | Status: AC
Start: 2021-04-21 — End: 2021-04-21
  Administered 2021-04-21: 1000 mL via INTRAVENOUS

## 2021-04-21 MED ORDER — LORAZEPAM 2 MG/ML IJ SOLN: 1.0000 mg | INTRAMUSCULAR | Status: DC | PRN

## 2021-04-21 MED ORDER — THIAMINE HCL 100 MG PO TABS
100.0000 mg | ORAL_TABLET | Freq: Every day | ORAL | Status: DC
  Administered 2021-04-21: 100 mg via ORAL
  Filled 2021-04-21: qty 1

## 2021-04-21 MED ORDER — IOHEXOL 350 MG/ML SOLN
75.0000 mL | Freq: Once | INTRAVENOUS | Status: AC | PRN
  Administered 2021-04-21: 75 mL via INTRAVENOUS

## 2021-04-21 MED ORDER — SODIUM CHLORIDE 0.9 % IV SOLN: INTRAVENOUS | Status: DC

## 2021-04-21 MED ORDER — ACETAMINOPHEN 325 MG PO TABS: 650.0000 mg | ORAL_TABLET | ORAL | Status: DC | PRN

## 2021-04-21 MED ORDER — FOLIC ACID 1 MG PO TABS
1.0000 mg | ORAL_TABLET | Freq: Every day | ORAL | Status: DC
  Administered 2021-04-21: 1 mg via ORAL
  Filled 2021-04-21: qty 1

## 2021-04-21 MED ORDER — UMECLIDINIUM-VILANTEROL 62.5-25 MCG/ACT IN AEPB
1.0000 | INHALATION_SPRAY | Freq: Every day | RESPIRATORY_TRACT | Status: DC
  Filled 2021-04-21: qty 14

## 2021-04-21 MED ORDER — ENOXAPARIN SODIUM 40 MG/0.4ML IJ SOSY
40.0000 mg | PREFILLED_SYRINGE | INTRAMUSCULAR | Status: DC
  Administered 2021-04-21: 40 mg via SUBCUTANEOUS
  Filled 2021-04-21: qty 0.4

## 2021-04-21 MED ORDER — MECLIZINE HCL 25 MG PO TABS
25.0000 mg | ORAL_TABLET | Freq: Once | ORAL | Status: AC
  Administered 2021-04-21: 25 mg via ORAL
  Filled 2021-04-21: qty 1

## 2021-04-21 MED ORDER — ALBUTEROL SULFATE (2.5 MG/3ML) 0.083% IN NEBU: 2.5000 mg | INHALATION_SOLUTION | Freq: Four times a day (QID) | RESPIRATORY_TRACT | Status: DC | PRN

## 2021-04-21 MED ORDER — BISOPROLOL-HYDROCHLOROTHIAZIDE 2.5-6.25 MG PO TABS
1.0000 | ORAL_TABLET | Freq: Every day | ORAL | Status: DC
  Administered 2021-04-21: 1 via ORAL
  Filled 2021-04-21: qty 1

## 2021-04-21 MED ORDER — GADOBUTROL 1 MMOL/ML IV SOLN
7.0000 mL | Freq: Once | INTRAVENOUS | Status: AC | PRN
  Administered 2021-04-21: 7 mL via INTRAVENOUS

## 2021-04-21 MED ORDER — ACETAMINOPHEN 650 MG RE SUPP: 650.0000 mg | RECTAL | Status: DC | PRN

## 2021-04-21 MED ORDER — LORAZEPAM 1 MG PO TABS: 1.0000 mg | ORAL_TABLET | ORAL | Status: DC | PRN

## 2021-04-21 MED ORDER — SENNOSIDES-DOCUSATE SODIUM 8.6-50 MG PO TABS: 1.0000 | ORAL_TABLET | Freq: Every evening | ORAL | Status: DC | PRN

## 2021-04-21 MED ORDER — ADULT MULTIVITAMIN W/MINERALS CH
1.0000 | ORAL_TABLET | Freq: Every day | ORAL | Status: DC
  Administered 2021-04-21: 1 via ORAL
  Filled 2021-04-21: qty 1

## 2021-04-21 MED ORDER — THIAMINE HCL 100 MG/ML IJ SOLN: 100.0000 mg | Freq: Every day | INTRAMUSCULAR | Status: DC

## 2021-04-21 MED ORDER — CITALOPRAM HYDROBROMIDE 10 MG PO TABS
20.0000 mg | ORAL_TABLET | Freq: Every day | ORAL | Status: DC
Start: 2021-04-21 — End: 2021-04-21
  Administered 2021-04-21: 20 mg via ORAL
  Filled 2021-04-21: qty 2

## 2021-04-21 MED ORDER — GABAPENTIN 100 MG PO CAPS: 100.0000 mg | ORAL_CAPSULE | Freq: Every day | ORAL | Status: DC

## 2021-04-21 NOTE — Progress Notes (Signed)
SLP Cancellation Note  Patient Details Name: Debra Trevino MRN: [id number] DOB: 06-23-1955   Cancelled treatment:       Reason Eval/Treat Not Completed: Patient at procedure or test/unavailable. Pt leaving the floor to go to radiology. Will f/u as able for cognitive-linguistic evaluation.     Osie Bond., M.A. [facility] Pager [phone] Office [phone]  04/21/2021, 2:57 PM

## 2021-04-21 NOTE — ED Notes (Signed)
Pt to MRI

## 2021-04-21 NOTE — Assessment & Plan Note (Signed)
-  Likely needs PCP f/u -She is not currently taking Anoro - but since she continues to smoke it may be reasonable to add this back along with albuterol

## 2021-04-21 NOTE — ED Notes (Signed)
Patient transported to CT 

## 2021-04-21 NOTE — Assessment & Plan Note (Addendum)
-  Patient presented with somewhat AMS, ataxia -Initial evaluation was for CVA, negative evaluation other than meningioma on MRI -UDS was checked and was + for [facility] -Upon further discussion, she acknowledged use of THC gummy "inadvertently" yesterday -This is likely the source of her symptoms and she is improving now that it is wearing off -Neurology consulted -PT/OT evaluation requested -She appears to be stable for dc to home

## 2021-04-21 NOTE — Progress Notes (Signed)
°   04/21/21 1523  OTHER  Substance Abuse Education Offered Yes  Substance abuse interventions Patient Counseling  (CAGE-AID) Substance Abuse Screening Tool  Have You Ever Felt You Ought to Cut Down on Your Drinking or Drug Use? 0  Have People Annoyed You By Critizing Your Drinking Or Drug Use? 0  Have You Felt Bad Or Guilty About Your Drinking Or Drug Use? 0  Have You Ever Had a Drink or Used Drugs First Thing In The Morning to Steady Your Nerves or to Get Rid of a Hangover? 0  CAGE-AID Score 0   Pt scored zero on CAGE-AID assessment. Pt was gracious during discussion and states that she is aware of SUD resources should she feel that any of the answers to CAGE-AID are yes.

## 2021-04-21 NOTE — ED Notes (Addendum)
Patient ambulating with PT, SW called for [facility] consult.

## 2021-04-21 NOTE — Evaluation (Signed)
Physical Therapy Evaluation Patient Details Name: Debra Trevino MRN: [id number] DOB: Jun 19, 1955 Today's Date: 04/21/2021  History of Present Illness  66 y.o. female presents to [facility] on 04/20/2021 with dizziness, unsteadiness and tremors. CT scan demonstrates Densely calcified 13 mm  meningioma along the right frontotemporal convexity. Pt also found to have UTI. PMH includes depression, HTN, GERD, OA.  Clinical Impression  Pt presents to PT with mild deficits in balance and gait compared to baseline. Pt reports wooziness and feelings of unsteadiness, however she is able to tolerate dynamic gait challenges without loss of balance. PT encourages the pt to utilize cane or walker at home if these devices improve her confidence when mobilizing. PT anticipates continued improvement in gait and balance with continued mobilization.       Recommendations for follow up therapy are one component of a multi-disciplinary discharge planning process, led by the attending physician.  Recommendations may be updated based on patient status, additional functional criteria and insurance authorization.  Follow Up Recommendations No PT follow up    Assistance Recommended at Discharge PRN  Patient can return home with the following  A little help with walking and/or transfers (supervision)    Equipment Recommendations None recommended by PT (pt owns necessary DME)  Recommendations for Other Services       Functional Status Assessment Patient has had a recent decline in their functional status and demonstrates the ability to make significant improvements in function in a reasonable and predictable amount of time.     Precautions / Restrictions Precautions Precautions: Fall Restrictions Weight Bearing Restrictions: No      Mobility  Bed Mobility Overal bed mobility: Independent                  Transfers Overall transfer level: Needs assistance Equipment used: None Transfers: Sit  to/from Stand Sit to Stand: Supervision           General transfer comment: pt reaching for support of counter    Ambulation/Gait Ambulation/Gait assistance: Supervision Gait Distance (Feet): 250 Feet Assistive device: None Gait Pattern/deviations: Step-through pattern Gait velocity: functional Gait velocity interpretation: 1.31 - 2.62 ft/sec, indicative of limited community ambulator   General Gait Details: pt is able to change gait speed minimally, changes stride length, able to stop abruptly and walk backward all without loss of balance  Stairs            Wheelchair Mobility    Modified Rankin (Stroke Patients Only) Modified Rankin (Stroke Patients Only) Pre-Morbid Rankin Score: No symptoms Modified Rankin: Slight disability     Balance Overall balance assessment: Needs assistance Sitting-balance support: No upper extremity supported, Feet supported Sitting balance-Leahy Scale: Good     Standing balance support: No upper extremity supported, During functional activity Standing balance-Leahy Scale: Good                               Pertinent Vitals/Pain Pain Assessment Pain Assessment: No/denies pain    Home Living Family/patient expects to be discharged to:: Private residence Living Arrangements: Children;Spouse/significant other Available Help at Discharge: Family;Available 24 hours/day Type of Home: House Home Access: Stairs to enter Entrance Stairs-Rails: Can reach both Entrance Stairs-Number of Steps: 3 Alternate Level Stairs-Number of Steps: flight Home Layout: Two level Home Equipment: Conservation officer, nature (2 wheels);Cane - single point      Prior Function Prior Level of Function : Independent/Modified Independent  Hand Dominance        Extremity/Trunk Assessment   Upper Extremity Assessment Upper Extremity Assessment: Overall WFL for tasks assessed    Lower Extremity Assessment Lower Extremity  Assessment: Overall WFL for tasks assessed    Cervical / Trunk Assessment Cervical / Trunk Assessment: Normal  Communication   Communication: No difficulties  Cognition Arousal/Alertness: Awake/alert Behavior During Therapy: WFL for tasks assessed/performed Overall Cognitive Status: Within Functional Limits for tasks assessed                                          General Comments General comments (skin integrity, edema, etc.): VSS on RA, pt reports wooziness however states that it is improving and is different from her previous vertigo symptoms    Exercises     Assessment/Plan    PT Assessment Patient needs continued PT services  PT Problem List Decreased balance       PT Treatment Interventions Gait training;Stair training;Balance training;Neuromuscular re-education    PT Goals (Current goals can be found in the Care Plan section)  Acute Rehab PT Goals Patient Stated Goal: to go home PT Goal Formulation: With patient Time For Goal Achievement: 05/05/21 Potential to Achieve Goals: Good Additional Goals Additional Goal #1: Pt will score >19/24 on DGI to indicate a reduced risk for falls    Frequency Min 3X/week     Co-evaluation               AM-PAC PT "6 Clicks" Mobility  Outcome Measure Help needed turning from your back to your side while in a flat bed without using bedrails?: None Help needed moving from lying on your back to sitting on the side of a flat bed without using bedrails?: None Help needed moving to and from a bed to a chair (including a wheelchair)?: A Little Help needed standing up from a chair using your arms (e.g., wheelchair or bedside chair)?: A Little Help needed to walk in hospital room?: A Little Help needed climbing 3-5 steps with a railing? : A Little 6 Click Score: 20    End of Session   Activity Tolerance: Patient tolerated treatment well Patient left: in bed;with call bell/phone within reach;with  family/visitor present Nurse Communication: Mobility status PT Visit Diagnosis: Unsteadiness on feet (R26.81)    Time: 4287-6811 PT Time Calculation (min) (ACUTE ONLY): 15 min   Charges:   PT Evaluation $PT Eval Low Complexity: 1 Low          Zenaida Niece, PT, DPT Acute Rehabilitation Pager: [phone] Office [phone]   Zenaida Niece 04/21/2021, 3:39 PM

## 2021-04-21 NOTE — Consult Note (Signed)
ER Consult Note   Patient: Debra Trevino HBZ:[id number] DOB: 09-28-1955 DOA: 04/20/2021 DOS: the patient was seen and examined on 04/21/2021 PCP: Eulas Post, MD  Patient coming from: Home - lives with husband and son; Donald Prose: Husband, [phone]   Chief Complaint: Ataxia  HPI: Debra Trevino is a 66 y.o. female with medical history significant of HTN and COPD presenting with ataxia.  She reports that she was woozy off balance.  Symptoms started yesterday and came on suddenly.  She feels ok at rest, hasn't tried to get up.  Slight headaches.  She does report some weakness and jumping of her legs.   Her husband reports that last night she was light-headed and dizzy.  She didn't feel well and so came to the ER.  She has been having some vertigo issues, has been seeing her PCP for this.  She typically is interactive and a reasonable historian.  Her husband noted last night and this AM that she was "out of it" a little bit.  She drinks regularly.  She had knee replacement surgery about 2 months ago.  Her symptoms have been waxing and waning for some time.  He called her about 630AM and she seemed lethargic and "zoned out".  She later acknowledged to the neurology resident that she "inadvertently" consumed a THC gummy yesterday.    ER Course:  Carryover, per Dr. Tonie Griffith:  66 yo female with ataxia and being off balance. Not able to ambulate on her own.  Has hx of COPD, HTN. Had elevated alcohol level last night but after 8 hours in ER still not able to walk. Negative CT head. MRI has no acute CVA. Has a meningioma with no edema or mass effect.      Review of Systems: As mentioned in the history of present illness. All other systems reviewed and are negative.  Limited by patient ?cognitive impairment  Past Medical History:  Diagnosis Date   Bilateral foot pain    morton neuroma, seeing podiatrist   CTS (carpal tunnel syndrome)    has seen hand specilist   Depression  03/24/2007   Qualifier: Diagnosis of  By: Arnoldo Morale MD, Balinda Quails    Gastric ulcer    per patient   GERD (gastroesophageal reflux disease) 05/27/2014   Sees Dr. Earlean Shawl; hx erosive esophagitis   Hot flashes    recur when stops HRT   HTN (hypertension)    Low back pain    chronic   Migraines    resolved on HRT for hot flases   Muscle spasm    very active, muscle spasm after tennis matches   Past Surgical History:  Procedure Laterality Date   CARPAL TUNNEL RELEASE     right hand   MEDIAL PARTIAL KNEE REPLACEMENT  2017   Right knee   TONSILLECTOMY     Social History:  reports that she has been smoking cigarettes. She has a 50.00 pack-year smoking history. She has never used smokeless tobacco. She reports current alcohol use. She reports that she does not use drugs.  Allergies  Allergen Reactions   Penicillins Swelling    Has patient had a PCN reaction causing immediate rash, facial/tongue/throat swelling, SOB or lightheadedness with hypotension:YES Has patient had a PCN reaction causing severe rash involving mucus membranes or skin necrosis: NO Has patient had a PCN reaction that required hospitalization NO Has patient had a PCN reaction occurring within the last 10 years: NO If all of the above answers are "NO",  then may proceed with Cephalosporin use.   Tape Rash    Pt is not sure if paper tape also causes the rash. Reaction was to surgical tape    Family History  Problem Relation Age of Onset   Charcot-Marie-Tooth disease Father    Renal cancer Mother    Schizophrenia Brother    Hypertension Other     Prior to Admission medications   Medication Sig Start Date End Date Taking? Authorizing Provider  albuterol (VENTOLIN HFA) 108 (90 Base) MCG/ACT inhaler Inhale 2 puffs into the lungs every 6 (six) hours as needed for wheezing or shortness of breath. 02/24/20   Garner Nash, DO  bisoprolol-hydrochlorothiazide (ZIAC) 2.5-6.25 MG tablet TAKE 1 TABLET BY MOUTH EVERY DAY 02/13/21    Burchette, Alinda Sierras, MD  citalopram (CELEXA) 20 MG tablet TAKE 1 TABLET BY MOUTH EVERY DAY 02/13/21   Burchette, Alinda Sierras, MD  cyclobenzaprine (FLEXERIL) 10 MG tablet Take 10 mg by mouth 3 (three) times daily as needed for muscle spasms.    [provider]  gabapentin (NEURONTIN) 100 MG capsule Take 100 mg by mouth at bedtime.     [provider]  meloxicam (MOBIC) 15 MG tablet Take 15 mg by mouth daily.    [provider]  Menthol-Methyl Salicylate (GRX ANALGESIC BALM) OINT Apply 1 application topically 3 (three) times daily as needed (pain).     [provider]  Omeprazole 20 MG TBEC Take 20 mg by mouth daily.  10/29/14   [provider]  umeclidinium-vilanterol (ANORO ELLIPTA) 62.5-25 MCG/INH AEPB Inhale 1 puff into the lungs daily. 02/24/20   Garner Nash, DO    Physical Exam: Vitals:   04/21/21 1100 04/21/21 1145 04/21/21 1215 04/21/21 1542  BP: 122/65 118/63 (!) 115/98 98/64  Pulse: 85 79 89 76  Resp: 16 14 (!) 23 18  Temp:    97.8 F (36.6 C)  TempSrc:    Oral  SpO2: 97% 93% 95% 96%   General:  Appears calm and mildly confused and is in NAD Eyes:  PERRL, EOMI, normal lids, iris ENT:  grossly normal hearing, lips & tongue, mmm Neck:  no LAD, masses or thyromegaly Cardiovascular:  RR with mild tachycardia, no m/r/g. No LE edema.  Respiratory:   CTA bilaterally with no wheezes/rales/rhonchi.  Normal respiratory effort. Abdomen:  soft, NT, ND Skin:  no rash or induration seen on limited exam Musculoskeletal:  grossly normal tone BUE/BLE, good ROM, no bony abnormality Psychiatric:  eccentric mood and affect, speech fluent but slow to respond, AOx3 Neurologic:  CN 2-12 grossly intact, moves all extremities in coordinated fashion   Radiological Exams on Admission: Independently reviewed - see discussion in A/P where applicable  CT ANGIO HEAD W OR WO CONTRAST  Result Date: 04/21/2021 CLINICAL DATA:  Dizziness, persistent/recurrent,  cardiac or vascular cause suspected EXAM: CT ANGIOGRAPHY HEAD TECHNIQUE: Multidetector CT imaging of the head was performed using the standard protocol during bolus administration of intravenous contrast. Multiplanar CT image reconstructions and MIPs were obtained to evaluate the vascular anatomy. RADIATION DOSE REDUCTION: This exam was performed according to the departmental dose-optimization program which includes automated exposure control, adjustment of the mA and/or kV according to patient size and/or use of iterative reconstruction technique. CONTRAST:  72mL OMNIPAQUE IOHEXOL 350 MG/ML SOLN COMPARISON:  CT February 9, 23. FINDINGS: CT HEAD Brain: No evidence of acute large vascular territory infarct, acute hemorrhage, midline shift, hydrocephalus or extra-axial fluid collection. Similar size/appearance of a 13 mm  densely calcified extra-axial mass along the right parietal convexity, compatible with a meningioma. Vascular: See below. Skull: No acute fracture. Sinuses: Clear sinuses. Other: Unremarkable orbits CTA HEAD Anterior circulation: Bilateral intracranial ICAs, MCAs, and ACAs are patent without proximal hemodynamically significant stenosis. No aneurysm identified. Posterior circulation: Right intradural vertebral artery is widely patent. The left vertebral artery is likely congenitally small and makes a large contribution to a prominent left PICA; however, there may probably is a superimposed stenosis of the distal vertebral artery with poor opacification. Basilar artery and bilateral posterior cerebral arteries are patent without proximal hemodynamically significant stenosis. Right fetal type PCA. No aneurysm identified. Venous sinuses: As permitted by contrast timing, patent. Anatomic variants: Detailed above Review of the MIP images confirms the above findings. IMPRESSION: CT head: 1. No evidence of acute intracranial abnormality. 2. Similar 13 mm presumed meningioma along the right parietal  convexity. CTA: 1. Small/diminutive intradural left vertebral artery, likely in part congenital although there probably is a superimposed distal stenosis with poor distal vertebral artery opacification. 2. Otherwise, no proximal hemodynamically significant stenosis. Electronically Signed   By: Margaretha Sheffield M.D.   On: 04/21/2021 15:35   CT Head Wo Contrast  Result Date: 04/20/2021 CLINICAL DATA:  Neuro deficit, acute, stroke suspected EXAM: CT HEAD WITHOUT CONTRAST TECHNIQUE: Contiguous axial images were obtained from the base of the skull through the vertex without intravenous contrast. RADIATION DOSE REDUCTION: This exam was performed according to the departmental dose-optimization program which includes automated exposure control, adjustment of the mA and/or kV according to patient size and/or use of iterative reconstruction technique. COMPARISON:  None. FINDINGS: Brain: Normal anatomic configuration. Densely calcified 13 mm meningioma along the right frontotemporal convexity. No abnormal intra or extra-axial fluid collection. No abnormal mass effect or midline shift. No evidence of acute intracranial hemorrhage or infarct. Ventricular size is normal. Cerebellum unremarkable. Vascular: Unremarkable Skull: Intact Sinuses/Orbits: Paranasal sinuses are clear. Orbits are unremarkable. Other: Mastoid air cells and middle ear cavities are clear. IMPRESSION: No acute intracranial abnormality. Electronically Signed   By: Fidela Salisbury M.D.   On: 04/20/2021 23:10   MR BRAIN WO CONTRAST  Result Date: 04/21/2021 CLINICAL DATA:  Initial evaluation for acute neuro deficit, stroke suspected. EXAM: MRI HEAD WITHOUT CONTRAST TECHNIQUE: Multiplanar, multiecho pulse sequences of the brain and surrounding structures were obtained without intravenous contrast. COMPARISON:  Prior CT from 04/20/2021. FINDINGS: Brain: Cerebral volume within normal limits for age. Minimal scattered T2/FLAIR hyperintensity noted involving the  periventricular deep white matter both cerebral hemispheres, likely related to chronic microvascular ischemic disease, felt to be within normal limits for age. No abnormal foci of restricted diffusion to suggest acute or subacute ischemia. Gray-white matter differentiation maintained. No areas of chronic cortical infarction. No acute or chronic intracranial hemorrhage. 11 mm meningioma at the posterior right parieto-occipital convexity noted (series 10, image 16). No associated edema or mass effect. No other mass lesion. No midline shift. No hydrocephalus or extra-axial fluid collection. Pituitary gland suprasellar region normal. Midline structures intact. Vascular: Major intracranial vascular flow voids are maintained. Skull and upper cervical spine: Craniocervical junction normal. Bone marrow signal intensity within normal limits. No scalp soft tissue abnormality. Sinuses/Orbits: Globes orbital soft tissues within normal limits. Mild mucosal thickening noted within the ethmoidal air cells. Paranasal sinuses are otherwise clear. No mastoid effusion. Inner ear structures grossly within normal limits. Other: None. IMPRESSION: 1. No acute intracranial abnormality. 2. 11 mm meningioma at the posterior right parieto-occipital convexity without associated edema or mass  effect. 3. Otherwise normal brain MRI for age. Electronically Signed   By: Jeannine Boga M.D.   On: 04/21/2021 03:39   MR CERVICAL SPINE W WO CONTRAST  Result Date: 04/21/2021 CLINICAL DATA:  Ataxia, nontraumatic, cervical pathology suspected. Dizziness with difficulty walking. EXAM: MRI CERVICAL SPINE WITHOUT AND WITH CONTRAST TECHNIQUE: Multiplanar and multiecho pulse sequences of the cervical spine, to include the craniocervical junction and cervicothoracic junction, were obtained without and with intravenous contrast. CONTRAST:  9mL GADAVIST GADOBUTROL 1 MMOL/ML IV SOLN COMPARISON:  Cervical spine CT 09/19/2020.  Chest CT 09/01/2020.  FINDINGS: Alignment: Stable straightening with 3 mm of anterolisthesis at C5-6. Vertebrae: No acute or suspicious osseous findings. There are stable endplate degenerative changes at C5-6 and C6-7. There is mild subchondral edema and enhancement within the facet joints, greatest on the left at C5-6. Cord: Normal in signal and caliber. No abnormal intradural enhancement. Posterior Fossa, vertebral arteries, paraspinal tissues: Intracranial findings dictated separately. Grossly stable nodularity of the thyroid gland consistent with multinodular goiter.Bilateral vertebral artery flow voids. No significant paraspinal findings. Disc levels: C2-3 through C4-5: Mild disc bulging, uncinate spurring and facet hypertrophy. No spinal stenosis or nerve root encroachment. C5-6: Loss of disc height with annular disc bulging, uncinate spurring and facet hypertrophy, worse on the left. No cord deformity. Stable mild right and moderate to severe left foraminal narrowing with possible left C6 nerve root encroachment. C6-7: Stable spondylosis with loss of disc height, endplate osteophytes and bilateral facet hypertrophy. Stable mild to moderate foraminal narrowing bilaterally. C7-T1: Stable mild disc bulging and bilateral facet hypertrophy. Mild foraminal narrowing bilaterally. IMPRESSION: 1. No acute findings are identified within the cervical spine. Findings are similar to previous MRI from 7 months prior. 2. No evidence of myelopathy or abnormal intradural enhancement. 3. Spondylosis as described, greatest at C5-6 and C6-7. At C5-6, there is moderate to severe foraminal narrowing on the left with possible left C6 nerve root encroachment. Electronically Signed   By: Richardean Sale M.D.   On: 04/21/2021 14:13    EKG: Independently reviewed.  NSR with rate 97; nonspecific ST changes with no evidence of acute ischemia   Labs on Admission: I have personally reviewed the available labs and imaging studies at the time of the  admission.  Pertinent labs:    Glucose 155 Normal CBC COVID/flu negative UA: small Hgb, moderate LE ETOH 120    Assessment and Plan: * Ataxia -Patient presented with somewhat AMS, ataxia -Initial evaluation was for CVA, negative evaluation other than meningioma on MRI -UDS was checked and was + for [facility] -Upon further discussion, she acknowledged use of THC gummy "inadvertently" yesterday -This is likely the source of her symptoms and she is improving now that it is wearing off -Neurology consulted -PT/OT evaluation requested -She appears to be stable for dc to home  Meningioma [facility])- (present on admission) -Incidentally seen on MRI -Outpatient PCP f/u recommended -If symptomatic, suggest referral to neurosurgery  Tobacco dependence- (present on admission) -Encourage cessation.   -This was discussed with the patient and should be reviewed on an ongoing basis.   -Patch ordered at patient request.  COPD (chronic obstructive pulmonary disease) ([place])- (present on admission) -Likely needs PCP f/u -She is not currently taking Anoro - but since she continues to smoke it may be reasonable to add this back along with albuterol  Essential hypertension- (present on admission) -Resume home Ziac     Thank you for this interesting consult.  Based on clinical improvement and acknowledged  use of mood-altering substance which likely caused her symptoms, dc to home is reasonable at this time.   Author: Karmen Bongo, MD 04/21/2021 5:52 PM  For on call review [email or web address].

## 2021-04-21 NOTE — Progress Notes (Signed)
PT Cancellation Note  Patient Details Name: [name] MRN: [id number] DOB: 1956-01-18   Cancelled Treatment:    Reason Eval/Treat Not Completed: Patient at procedure or test/unavailable. Pt at MRI. PT will follow up as time allows.   Zenaida Niece 04/21/2021, 2:54 PM

## 2021-04-21 NOTE — Assessment & Plan Note (Signed)
-  Resume home Ziac

## 2021-04-21 NOTE — Consult Note (Addendum)
Neurology Consultation  Reason for Consult: Ataxia Referring Physician: Dr. Lorin Mercy  CC: Ataxia  History is obtained from:patient  HPI: Debra Trevino is a 66 y.o. female w/ hx of BPPV, peripheral neuropathy, depression, HTN, GERD, and CTS presenting to [facility] due to progressive dizziness. Patient reports that last night around 8 PM, patient was sitting and having a drink with her husband when she noticed progressively worsening dizziness and confusion. Patient had difficulty comprehending what husband was saying and had latency in response. Patient reports feeling "woozy" and then "wobbly" when trying to get up. Patient reports that her husband had to assist her to ambulate and go to car. Of note, patient had 3 vodka cranberries last night as well as a THC gummy. Patient reports she did not know it was [facility] until son told her later. Patient reports not having had THC for months and has only used it a dozen times in her lifetime.   Patient normally is able to ambulate without assistance and states the latency in response is new. Patient reports that she has had this type of symptom at least twice. Per chart review, patient had been seen in the ED in April 2021 for positional lightheadedness and generalized weakness that improved with fluids. Patient more recently in January 2023 began having vertigo followed by ear pain. She was given zithromax and prednisone taper. While her ear pain improved, the vertigo persited. The vertigo at that time was positional and appeared to have improved after outpatient PCP recommended Epley's maneuver on 04/12/21; however, patient reports she continued to have residual vertigo. Patient reports that her symptoms were much worse yesterday along with the confusion which is what prompted her to go to the ED.   Patient reports that today she feels her dizziness and speech latency is gradually improving but still not back to baseline.  ROS: A complete ROS was performed and is  negative except as noted in the HPI. Unable to obtain due to altered mental status.   Past Medical History:  Diagnosis Date   Bilateral foot pain    morton neuroma, seeing podiatrist   CTS (carpal tunnel syndrome)    has seen hand specilist   Depression 03/24/2007   Qualifier: Diagnosis of  By: Arnoldo Morale MD, Balinda Quails    Gastric ulcer    per patient   GERD (gastroesophageal reflux disease) 05/27/2014   Sees Dr. Earlean Shawl; hx erosive esophagitis   Hot flashes    recur when stops HRT   HTN (hypertension)    Low back pain    chronic   Migraines    resolved on HRT for hot flases   Muscle spasm    very active, muscle spasm after tennis matches     Family History  Problem Relation Age of Onset   Charcot-Marie-Tooth disease Father    Renal cancer Mother    Schizophrenia Brother    Hypertension Other      Social History:   reports that she has been smoking cigarettes. She has a 50.00 pack-year smoking history. She has never used smokeless tobacco. She reports current alcohol use. She reports that she does not use drugs.  Medications  Current Facility-Administered Medications:    0.9 %  sodium chloride infusion, , Intravenous, Continuous, Karmen Bongo, MD, Last Rate: 50 mL/hr at 04/21/21 1001, New Bag at 04/21/21 1001   acetaminophen (TYLENOL) tablet 650 mg, 650 mg, Oral, Q4H PRN **OR** acetaminophen (TYLENOL) 160 MG/5ML solution 650 mg, 650 mg, Per Tube, Q4H PRN **  OR** acetaminophen (TYLENOL) suppository 650 mg, 650 mg, Rectal, Q4H PRN, Karmen Bongo, MD   albuterol (PROVENTIL) (2.5 MG/3ML) 0.083% nebulizer solution 2.5 mg, 2.5 mg, Inhalation, Q6H PRN, Karmen Bongo, MD   bisoprolol-hydrochlorothiazide [facility]) 2.5-6.25 MG per tablet 1 tablet, 1 tablet, Oral, Daily, Karmen Bongo, MD, 1 tablet at 04/21/21 1610   citalopram (CELEXA) tablet 20 mg, 20 mg, Oral, Daily, Karmen Bongo, MD, 20 mg at 04/21/21 9604   enoxaparin (LOVENOX) injection 40 mg, 40 mg, Subcutaneous, Q24H, Karmen Bongo, MD, 40 mg at 54/09/81 1914   folic acid (FOLVITE) tablet 1 mg, 1 mg, Oral, Daily, Karmen Bongo, MD, 1 mg at 04/21/21 7829   gabapentin (NEURONTIN) capsule 100 mg, 100 mg, Oral, QHS, Karmen Bongo, MD   LORazepam (ATIVAN) tablet 1-4 mg, 1-4 mg, Oral, Q1H PRN **OR** LORazepam (ATIVAN) injection 1-4 mg, 1-4 mg, Intravenous, Q1H PRN, Karmen Bongo, MD   multivitamin with minerals tablet 1 tablet, 1 tablet, Oral, Daily, Karmen Bongo, MD, 1 tablet at 04/21/21 5621   pantoprazole (PROTONIX) EC tablet 40 mg, 40 mg, Oral, Daily, Karmen Bongo, MD, 40 mg at 04/21/21 3086   senna-docusate (Senokot-S) tablet 1 tablet, 1 tablet, Oral, QHS PRN, Karmen Bongo, MD   thiamine tablet 100 mg, 100 mg, Oral, Daily, 100 mg at 04/21/21 0953 **OR** thiamine (B-1) injection 100 mg, 100 mg, Intravenous, Daily, Karmen Bongo, MD   umeclidinium-vilanterol (ANORO ELLIPTA) 62.5-25 MCG/ACT 1 puff, 1 puff, Inhalation, Daily, Karmen Bongo, MD  Current Outpatient Medications:    Acetaminophen (TYLENOL PO), Take 1 tablet by mouth daily as needed (prior to physical therapy)., Disp: , Rfl:    bisoprolol-hydrochlorothiazide (ZIAC) 2.5-6.25 MG tablet, TAKE 1 TABLET BY MOUTH EVERY DAY (Patient taking differently: Take 1 tablet by mouth at bedtime.), Disp: 90 tablet, Rfl: 1   citalopram (CELEXA) 20 MG tablet, TAKE 1 TABLET BY MOUTH EVERY DAY (Patient taking differently: Take 20 mg by mouth every morning.), Disp: 90 tablet, Rfl: 0   cyclobenzaprine (FLEXERIL) 10 MG tablet, Take 10 mg by mouth at bedtime as needed for muscle spasms., Disp: , Rfl:    diclofenac Sodium (VOLTAREN) 1 % GEL, Apply 1 application topically 2 (two) times daily as needed (pain)., Disp: , Rfl:    gabapentin (NEURONTIN) 100 MG capsule, Take 100 mg by mouth at bedtime. , Disp: , Rfl:    meloxicam (MOBIC) 15 MG tablet, Take 15 mg by mouth every morning., Disp: , Rfl:    omeprazole (PRILOSEC) 20 MG capsule, Take 20 mg by mouth every morning.,  Disp: , Rfl: 3   albuterol (VENTOLIN HFA) 108 (90 Base) MCG/ACT inhaler, Inhale 2 puffs into the lungs every 6 (six) hours as needed for wheezing or shortness of breath. (Patient not taking: Reported on 04/21/2021), Disp: 8 g, Rfl: 6   umeclidinium-vilanterol (ANORO ELLIPTA) 62.5-25 MCG/INH AEPB, Inhale 1 puff into the lungs daily. (Patient not taking: Reported on 04/21/2021), Disp: 2 each, Rfl: 0   Exam: Current vital signs: BP 122/65    Pulse 85    Temp 98 F (36.7 C) (Oral)    Resp 16    SpO2 97%  Vital signs in last 24 hours: Temp:  [97.7 F (36.5 C)-98 F (36.7 C)] 98 F (36.7 C) (02/10 0500) Pulse Rate:  [82-109] 85 (02/10 1100) Resp:  [12-25] 16 (02/10 1100) BP: (91-140)/(55-88) 122/65 (02/10 1100) SpO2:  [91 %-99 %] 97 % (02/10 1100)  GENERAL: Awake, alert, in no acute distress Psych: Affect appropriate for situation, patient is  calm and cooperative with examination Head: Normocephalic and atraumatic, without obvious abnormality EENT: Normal conjunctivae, dry mucous membranes, no OP obstruction LUNGS: Normal respiratory effort. Non-labored breathing on room air CV: Regular rate and rhythm on telemetry ABDOMEN: Soft, non-tender, non-distended Extremities: warm, well perfused, without obvious deformity  NEURO:  Mental Status: Awake, alert, and oriented to person, place, time, and situation. He/She is able to provide a clear and coherent history of present illness. Speech/Language: latency in speech.    Naming, repetition, fluency, and comprehension intact without aphasia No neglect is noted Cranial Nerves:  II: PERRL. visual fields full.  III, IV, VI: EOMI. Lid elevation symmetric and full.  V: Sensation is intact to light touch and symmetrical to face. Blinks to threat. Moves jaw back and forth.  VII: Face is symmetric resting and smiling. Able to puff cheeks and raise eyebrows.  VIII: Hearing intact to voice IX, X: Palate elevation is symmetric. Phonation normal.  XI:  Normal sternocleidomastoid and trapezius muscle strength XII: Tongue protrudes midline without fasciculations.   Motor: 5/5 strength is all muscle groups.  Tone is normal. Bulk is normal.  Sensation: Intact to light touch bilaterally in all four extremities. No extinction to DSS present.  Coordination: FTN slight intentional tremor and unable to touch nose with eyes closed. HKS intact bilaterally. No pronator drift. Alternating hand movements.  DTRs: 2+ LLE, 3+ RLE patellar, 3+ bicep reflex bilaterally Gait: Deferred  Labs I have reviewed labs in epic and the results pertinent to this consultation are:  UDS positive for [facility]  CBC    Component Value Date/Time   WBC 9.7 04/20/2021 2200   RBC 3.96 04/20/2021 2200   HGB 12.7 04/20/2021 2200   HCT 37.2 04/20/2021 2200   PLT 257 04/20/2021 2200   MCV 93.9 04/20/2021 2200   MCH 32.1 04/20/2021 2200   MCHC 34.1 04/20/2021 2200   RDW 14.5 04/20/2021 2200   LYMPHSABS 4.2 (H) 04/20/2021 2200   MONOABS 0.7 04/20/2021 2200   EOSABS 0.4 04/20/2021 2200   BASOSABS 0.0 04/20/2021 2200    CMP     Component Value Date/Time   NA 140 04/20/2021 2200   K 4.2 04/20/2021 2200   CL 107 04/20/2021 2200   CO2 23 04/20/2021 2200   GLUCOSE 155 (H) 04/20/2021 2200   BUN 23 04/20/2021 2200   CREATININE 0.87 04/20/2021 2200   CALCIUM 9.2 04/20/2021 2200   PROT 6.3 11/27/2012 0927   ALBUMIN 3.5 11/27/2012 0927   AST 18 11/27/2012 0927   ALT 19 11/27/2012 0927   ALKPHOS 59 11/27/2012 0927   BILITOT 0.2 (L) 11/27/2012 0927   GFRNONAA >60 04/20/2021 2200   GFRAA >60 06/24/2019 0633    Lipid Panel     Component Value Date/Time   CHOL 185 08/15/2018 1529   TRIG 244 (H) 08/15/2018 1529   HDL 49 08/15/2018 1529   CHOLHDL 3.8 08/15/2018 1529   CHOLHDL 3 08/16/2014 1004   VLDL 29.8 08/16/2014 1004   LDLCALC 87 08/15/2018 1529     Imaging I have reviewed the images obtained:  CT-scan of the brain: No acute intracranial abnormality.  MRI  examination of the brain: No acute intracranial abnormality. 11 mm meningioma at the posterior right parieto-occipital convexity without associated edema or mass effect  MRI C-spine: No acute findings are identified within the cervical spine. Findings are similar to previous MRI from 7 months prior.  Assessment: Debra Trevino is a 66 y.o. female w/ hx of BPPV, peripheral  neuropathy, depression, HTN, GERD, and CTS presenting to [facility] due to progressive dizziness.  Impression: Dizziness and speech latency likely 2/2 THC and alcohol use given positive UDS and history. UTI induced encephalopathy is possible given UA but patient denies Less likely Wernicke's encephalopathy vs folate deficiency induced encephalopathy. Folate was low at 4.6.   Recommendations: -Replete folate and any other vitamin deficiencies -UTI workup per IM -B1 level ordered, but patient already received thiamine this morning -Monitor patient for worsening of dizziness -Will get CTA to check for vascular stenosis  France Ravens, MD PGY1 Resident  I have seen the patient and reviewed the above note.  Initially, an MRI of the cervical spine and a CT angio were ordered, and these were performed, however subsequent to this her UDS resulted with a THC positivity.  When asking the patient about it, she admitted she took a THC gummy inadvertently yesterday afternoon(belonged to her son).  At this point, I think that this is consistent with her symptoms, and I would not favor further aggressive work-up given that she is improving as would be expected from [facility] intoxication.  Roland Rack, MD [facility] [phone]  If 7pm- 7am, please page neurology on call as listed in [place].

## 2021-04-21 NOTE — ED Notes (Signed)
Patient transported to MRI 

## 2021-04-21 NOTE — Assessment & Plan Note (Signed)
-  Incidentally seen on MRI -Outpatient PCP f/u recommended -If symptomatic, suggest referral to neurosurgery

## 2021-04-21 NOTE — ED Provider Notes (Signed)
[facility] EMERGENCY DEPARTMENT Provider Note   CSN: [id number] Arrival date & time: 04/20/21  2151     History  Chief Complaint  Patient presents with   Dizziness    Debra Trevino is a 66 y.o. female.   Dizziness Quality:  Lightheadedness and imbalance Severity:  Moderate Onset quality:  Sudden Duration:  3 hours Timing:  Constant Progression:  Unchanged Chronicity:  New Context: not when bending over, not with head movement and not with inactivity   Relieved by:  None tried Worsened by:  Nothing Ineffective treatments:  None tried Associated symptoms: nausea   Associated symptoms: no vomiting and no weakness       Home Medications Prior to Admission medications   Medication Sig Start Date End Date Taking? Authorizing Provider  albuterol (VENTOLIN HFA) 108 (90 Base) MCG/ACT inhaler Inhale 2 puffs into the lungs every 6 (six) hours as needed for wheezing or shortness of breath. 02/24/20   Garner Nash, DO  bisoprolol-hydrochlorothiazide (ZIAC) 2.5-6.25 MG tablet TAKE 1 TABLET BY MOUTH EVERY DAY 02/13/21   Burchette, Alinda Sierras, MD  citalopram (CELEXA) 20 MG tablet TAKE 1 TABLET BY MOUTH EVERY DAY 02/13/21   Burchette, Alinda Sierras, MD  cyclobenzaprine (FLEXERIL) 10 MG tablet Take 10 mg by mouth 3 (three) times daily as needed for muscle spasms.    [provider]  gabapentin (NEURONTIN) 100 MG capsule Take 100 mg by mouth at bedtime.     [provider]  meloxicam (MOBIC) 15 MG tablet Take 15 mg by mouth daily.    [provider]  Menthol-Methyl Salicylate (GRX ANALGESIC BALM) OINT Apply 1 application topically 3 (three) times daily as needed (pain).     [provider]  Omeprazole 20 MG TBEC Take 20 mg by mouth daily.  10/29/14   [provider]  umeclidinium-vilanterol (ANORO ELLIPTA) 62.5-25 MCG/INH AEPB Inhale 1 puff into the lungs daily. 02/24/20   Garner Nash, DO      Allergies    Penicillins     Review of Systems   Review of Systems  Gastrointestinal:  Positive for nausea. Negative for vomiting.  Neurological:  Positive for dizziness. Negative for weakness.   Physical Exam Updated Vital Signs BP 127/88 (BP Location: Left Leg)    Pulse 98    Temp 97.7 F (36.5 C) (Oral)    Resp 18    SpO2 99%  Physical Exam Vitals and nursing note reviewed.  Constitutional:      Appearance: She is well-developed.  HENT:     Head: Normocephalic and atraumatic.     Nose: Nose normal. No congestion or rhinorrhea.     Mouth/Throat:     Mouth: Mucous membranes are moist.     Pharynx: Oropharynx is clear.  Eyes:     Pupils: Pupils are equal, round, and reactive to light.  Cardiovascular:     Rate and Rhythm: Normal rate and regular rhythm.  Pulmonary:     Effort: No respiratory distress.     Breath sounds: No stridor.  Abdominal:     General: Abdomen is flat. There is no distension.  Musculoskeletal:        General: No swelling or tenderness. Normal range of motion.     Cervical back: Normal range of motion.  Skin:    General: Skin is warm and dry.     Coloration: Skin is not jaundiced or pale.  Neurological:     General: No focal deficit present.  Mental Status: She is alert.     Comments: Patient does not have any Facial droop.  Able to raise her eyebrows, close her eyes, move her eyes in all directions symmetrically.  Symmetric smile, symmetric tongue protrusion.  Symmetric and normal strength with abduction and abduction of her arms, biceps and grip strength.  She has sensation to light touch in both hands.  Finger-nose is normal. Sitting in the bed she has no truncal ataxia She has symmetric and normal ability to raise both legs, dorsiflexion and plantarflexion of both feet along with normal sensation to light touch in distal extremities. Reflexes were symmetric and patella and brachial radialis Reportedly when patient tried to walk she had significant balance issues requiring  2 person assist and seemed like she was falling all over the place but not consistently to one side.    ED Results / Procedures / Treatments   Labs (all labs ordered are listed, but only abnormal results are displayed) Labs Reviewed  ETHANOL - Abnormal; Notable for the following components:      Result Value   Alcohol, Ethyl (B) 120 (*)    All other components within normal limits  CBC WITH DIFFERENTIAL/PLATELET - Abnormal; Notable for the following components:   Lymphs Abs 4.2 (*)    All other components within normal limits  BASIC METABOLIC PANEL - Abnormal; Notable for the following components:   Glucose, Bld 155 (*)    All other components within normal limits  URINALYSIS, ROUTINE W REFLEX MICROSCOPIC - Abnormal; Notable for the following components:   Color, Urine STRAW (*)    Hgb urine dipstick SMALL (*)    Leukocytes,Ua MODERATE (*)    Bacteria, UA RARE (*)    All other components within normal limits  RESP PANEL BY RT-PCR (FLU A&B, COVID) [id number]  URINE CULTURE  PROTIME-INR    EKG EKG Interpretation  Date/Time:  Thursday April 20 2021 22:06:22 EST Ventricular Rate:  97 PR Interval:  180 QRS Duration: 86 QT Interval:  386 QTC Calculation: 490 R Axis:   57 Text Interpretation: Normal sinus rhythm Cannot rule out Anterior infarct , age undetermined Abnormal ECG When compared with ECG of 24-Jun-2019 06:15, PREVIOUS ECG IS PRESENT Confirmed by Merrily Pew [phone]) on 04/20/2021 10:58:36 PM  Radiology CT Head Wo Contrast  Result Date: 04/20/2021 CLINICAL DATA:  Neuro deficit, acute, stroke suspected EXAM: CT HEAD WITHOUT CONTRAST TECHNIQUE: Contiguous axial images were obtained from the base of the skull through the vertex without intravenous contrast. RADIATION DOSE REDUCTION: This exam was performed according to the departmental dose-optimization program which includes automated exposure control, adjustment of the mA and/or kV according to patient size and/or use of  iterative reconstruction technique. COMPARISON:  None. FINDINGS: Brain: Normal anatomic configuration. Densely calcified 13 mm meningioma along the right frontotemporal convexity. No abnormal intra or extra-axial fluid collection. No abnormal mass effect or midline shift. No evidence of acute intracranial hemorrhage or infarct. Ventricular size is normal. Cerebellum unremarkable. Vascular: Unremarkable Skull: Intact Sinuses/Orbits: Paranasal sinuses are clear. Orbits are unremarkable. Other: Mastoid air cells and middle ear cavities are clear. IMPRESSION: No acute intracranial abnormality. Electronically Signed   By: Fidela Salisbury M.D.   On: 04/20/2021 23:10    Procedures Procedures    Medications Ordered in ED Medications  nitrofurantoin (macrocrystal-monohydrate) (MACROBID) capsule 100 mg (has no administration in time range)    ED Course/ Medical Decision Making/ A&P  Medical Decision Making Amount and/or Complexity of Data Reviewed Labs: ordered. Radiology: ordered.  Risk Prescription drug management. Decision regarding hospitalization.  Work-up is negative aside from meningioma found on her CT scan.  However with her inability to walk and not being intoxicated to the point where she should have that much trouble we will get an MRI to make sure she does have a posterior stroke as CTs are relatively insensitive for this.  This also give her time to metabolize her alcohol and if that happens even if MRI is negative she can go home in this condition may need admission for further work-up. MRI negative but persistently symptomatic. Meclizine tried without success. Fluids started without success.  Unclear etiology but does have a UTI. Will d/w medicine for admission.   Final Clinical Impression(s) / ED Diagnoses Final diagnoses:  None    Rx / DC Orders ED Discharge Orders     None         Jakson Delpilar, Corene Cornea, MD 04/21/21 [phone]

## 2021-04-21 NOTE — Assessment & Plan Note (Signed)
-  Encourage cessation.   °-This was discussed with the patient and should be reviewed on an ongoing basis.   °-Patch ordered at patient request. °

## 2021-04-22 LAB — URINE CULTURE

## 2021-04-25 DIAGNOSIS — M25662 Stiffness of left knee, not elsewhere classified: Secondary | ICD-10-CM | POA: Diagnosis not present

## 2021-04-27 DIAGNOSIS — M25662 Stiffness of left knee, not elsewhere classified: Secondary | ICD-10-CM | POA: Diagnosis not present

## 2021-05-02 DIAGNOSIS — M25562 Pain in left knee: Secondary | ICD-10-CM | POA: Diagnosis not present

## 2021-05-02 DIAGNOSIS — M25662 Stiffness of left knee, not elsewhere classified: Secondary | ICD-10-CM | POA: Diagnosis not present

## 2021-05-04 DIAGNOSIS — M25662 Stiffness of left knee, not elsewhere classified: Secondary | ICD-10-CM | POA: Diagnosis not present

## 2021-05-05 ENCOUNTER — Ambulatory Visit (INDEPENDENT_AMBULATORY_CARE_PROVIDER_SITE_OTHER): Payer: Medicare Other | Admitting: Family Medicine

## 2021-05-05 ENCOUNTER — Telehealth: Payer: Self-pay | Admitting: Family Medicine

## 2021-05-05 ENCOUNTER — Encounter: Payer: Self-pay | Admitting: Family Medicine

## 2021-05-05 VITALS — BP 122/72 | HR 72 | Temp 97.5°F | Ht 62.0 in | Wt 150.4 lb

## 2021-05-05 DIAGNOSIS — Z7184 Encounter for health counseling related to travel: Secondary | ICD-10-CM

## 2021-05-05 DIAGNOSIS — R300 Dysuria: Secondary | ICD-10-CM

## 2021-05-05 DIAGNOSIS — R404 Transient alteration of awareness: Secondary | ICD-10-CM

## 2021-05-05 LAB — POCT URINALYSIS DIPSTICK
Bilirubin, UA: NEGATIVE
Blood, UA: NEGATIVE
Glucose, UA: NEGATIVE
Ketones, UA: NEGATIVE
Nitrite, UA: NEGATIVE
Protein, UA: NEGATIVE
Spec Grav, UA: 1.015 (ref 1.010–1.025)
Urobilinogen, UA: 0.2 E.U./dL
pH, UA: 6 (ref 5.0–8.0)

## 2021-05-05 MED ORDER — ATOVAQUONE-PROGUANIL HCL 250-100 MG PO TABS: ORAL_TABLET | ORAL | 0 refills | Status: DC

## 2021-05-05 NOTE — Patient Instructions (Signed)
Bring back urine for analysis.

## 2021-05-05 NOTE — Progress Notes (Signed)
Established Patient Office Visit  Subjective:  Patient ID: Debra Trevino, female    DOB: [birth year]  Age: 66 y.o. MRN: [id number]  CC:  Chief Complaint  Patient presents with   Dysuria    Patient complains of dysuria, x1 month     HPI Debra Trevino presents for several issues as follows  Upcoming trip to [place] sometime in May.  She needs malaria prevention. Not clear if she has had previous hepatitis A vaccine.  She has about 1 month history of intermittent burning with urination.  Symptoms seem to be worse when consuming inadequate fluids.  No fever.  No flank pain.  No gross hematuria.  Had urine culture done recently with a ER visit and this came back insignificant growth.  Presented recently to the ER earlier this month with some lightheadedness and balance difficulties.  She apparently had some initial concerns whether she was having a stroke.  There was no evidence for facial droop and essentially nonfocal neuro exam.  She did have fairly high alcohol level and also drug screen positive for tetrahydrocannabinol.  She states that she was taking a gummy from her son and that apparently accounted for her tetrahydrocannabinol level that was positive.  She states that she does not smoke marijuana and has used none since then.  She does drink vodka daily.  She had extensive work-up.  Her B12 was 247.  She had urinalysis that revealed some leukocytes but culture came back negative.  Multiple imaging including CT head, MRI brain, MRI cervical spine, and CT angiogram head.  She had incidental note of 11 mm meningioma at the posterior right parieto-occipital convexity.  No mass effect.  Denies any headache at this time.  MRI cervical spine revealed moderate to severe foraminal narrowing on the left with possible impingement left C6 nerve root.  CT angiogram head revealed no aneurysm.  She had no confusion since then.  No dizziness.  No syncope.   Past Medical History:  Diagnosis  Date   Bilateral foot pain    morton neuroma, seeing podiatrist   CTS (carpal tunnel syndrome)    has seen hand specilist   Depression 03/24/2007   Qualifier: Diagnosis of  By: Arnoldo Morale MD, Balinda Quails    Gastric ulcer    per patient   GERD (gastroesophageal reflux disease) 05/27/2014   Sees Dr. Earlean Shawl; hx erosive esophagitis   Hot flashes    recur when stops HRT   HTN (hypertension)    Low back pain    chronic   Migraines    resolved on HRT for hot flases   Muscle spasm    very active, muscle spasm after tennis matches    Past Surgical History:  Procedure Laterality Date   CARPAL TUNNEL RELEASE     right hand   MEDIAL PARTIAL KNEE REPLACEMENT  2017   Right knee   TONSILLECTOMY      Family History  Problem Relation Age of Onset   Charcot-Marie-Tooth disease Father    Renal cancer Mother    Schizophrenia Brother    Hypertension Other     Social History   Socioeconomic History   Marital status: Married    Spouse name: Not on file   Number of children: Not on file   Years of education: Not on file   Highest education level: Not on file  Occupational History   Not on file  Tobacco Use   Smoking status: Every Day    Packs/day: 1.00  Years: 50.00    Pack years: 50.00    Types: Cigarettes   Smokeless tobacco: Never   Tobacco comments:    Reports 5-10 cigs/day  Vaping Use   Vaping Use: Never used  Substance and Sexual Activity   Alcohol use: Yes    Comment: 2+ drinks per night, vodka or wine   Drug use: No   Sexual activity: Not on file  Other Topics Concern   Not on file  Social History Narrative   Work or School: 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      Home Situation: live with husband - boys off at college      Spiritual Beliefs: catholic      Lifestyle: lots of exercise - tennis; diet is healthy      Write with her left hand and does everything else with her right hand       Lives in a two story home    Social Determinants of Health    Financial Resource Strain: Low Risk    Difficulty of Paying Living Expenses: Not hard at all  Food Insecurity: No Food Insecurity   Worried About Charity fundraiser in the Last Year: Never true   Arboriculturist in the Last Year: Never true  Transportation Needs: No Transportation Needs   Lack of Transportation (Medical): No   Lack of Transportation (Non-Medical): No  Physical Activity: Sufficiently Active   Days of Exercise per Week: 5 days   Minutes of Exercise per Session: 60 min  Stress: No Stress Concern Present   Feeling of Stress : Not at all  Social Connections: Moderately Isolated   Frequency of Communication with Friends and Family: More than three times a week   Frequency of Social Gatherings with Friends and Family: Three times a week   Attends Religious Services: Never   Active Member of Clubs or Organizations: No   Attends Archivist Meetings: Never   Marital Status: Married  Human resources officer Violence: Not At Risk   Fear of Current or Ex-Partner: No   Emotionally Abused: No   Physically Abused: No   Sexually Abused: No    Outpatient Medications Prior to Visit  Medication Sig Dispense Refill   Acetaminophen (TYLENOL PO) Take 1 tablet by mouth daily as needed (prior to physical therapy).     bisoprolol-hydrochlorothiazide (ZIAC) 2.5-6.25 MG tablet TAKE 1 TABLET BY MOUTH EVERY DAY (Patient taking differently: Take 1 tablet by mouth at bedtime.) 90 tablet 1   citalopram (CELEXA) 20 MG tablet TAKE 1 TABLET BY MOUTH EVERY DAY (Patient taking differently: Take 20 mg by mouth every morning.) 90 tablet 0   cyclobenzaprine (FLEXERIL) 10 MG tablet Take 10 mg by mouth at bedtime as needed for muscle spasms.     diclofenac Sodium (VOLTAREN) 1 % GEL Apply 1 application topically 2 (two) times daily as needed (pain).     gabapentin (NEURONTIN) 100 MG capsule Take 100 mg by mouth at bedtime.      meloxicam (MOBIC) 15 MG tablet Take 15 mg by mouth every morning.      omeprazole (PRILOSEC) 20 MG capsule Take 20 mg by mouth every morning.  3   albuterol (VENTOLIN HFA) 108 (90 Base) MCG/ACT inhaler Inhale 2 puffs into the lungs every 6 (six) hours as needed for wheezing or shortness of breath. 8 g 6   umeclidinium-vilanterol (ANORO ELLIPTA) 62.5-25 MCG/INH AEPB Inhale 1 puff into the lungs daily. 2 each 0  No facility-administered medications prior to visit.    Allergies  Allergen Reactions   Penicillins Swelling    Has patient had a PCN reaction causing immediate rash, facial/tongue/throat swelling, SOB or lightheadedness with hypotension:YES Has patient had a PCN reaction causing severe rash involving mucus membranes or skin necrosis: NO Has patient had a PCN reaction that required hospitalization NO Has patient had a PCN reaction occurring within the last 10 years: NO If all of the above answers are "NO", then may proceed with Cephalosporin use.   Tape Rash    Pt is not sure if paper tape also causes the rash. Reaction was to surgical tape    ROS Review of Systems  Constitutional:  Negative for fatigue.  Eyes:  Negative for visual disturbance.  Respiratory:  Negative for cough, chest tightness, shortness of breath and wheezing.   Cardiovascular:  Negative for chest pain, palpitations and leg swelling.  Genitourinary:  Positive for dysuria. Negative for flank pain.  Neurological:  Negative for dizziness, seizures, syncope, weakness, light-headedness and headaches.     Objective:    Physical Exam Constitutional:      Appearance: Normal appearance. She is well-developed.  HENT:     Head: Normocephalic and atraumatic.  Eyes:     Pupils: Pupils are equal, round, and reactive to light.  Neck:     Thyroid: No thyromegaly.     Vascular: No JVD.  Cardiovascular:     Rate and Rhythm: Normal rate and regular rhythm.     Heart sounds:    No gallop.  Pulmonary:     Effort: Pulmonary effort is normal. No respiratory distress.     Breath sounds:  Normal breath sounds. No wheezing or rales.  Musculoskeletal:     Cervical back: Neck supple.  Neurological:     General: No focal deficit present.     Mental Status: She is alert.     Cranial Nerves: No cranial nerve deficit.     Motor: No weakness.     Gait: Gait normal.    BP 122/72 (BP Location: Left Arm, Patient Position: Sitting, Cuff Size: Normal)    Pulse 72    Temp (!) 97.5 F (36.4 C) (Oral)    Ht 5\' 2"  (1.575 m)    Wt 150 lb 6.4 oz (68.2 kg)    SpO2 96%    BMI 27.51 kg/m  Wt Readings from Last 3 Encounters:  05/05/21 150 lb 6.4 oz (68.2 kg)  04/12/21 150 lb 14.4 oz (68.4 kg)  12/26/20 154 lb 9.6 oz (70.1 kg)     Health Maintenance Due  Topic Date Due   Zoster Vaccines- Shingrix (1 of 2) Never done   DEXA SCAN  Never done   COVID-19 Vaccine (5 - Booster for Pfizer series) 10/17/2020    There are no preventive care reminders to display for this patient.  Lab Results  Component Value Date   TSH 1.50 06/26/2019   Lab Results  Component Value Date   WBC 9.7 04/20/2021   HGB 12.7 04/20/2021   HCT 37.2 04/20/2021   MCV 93.9 04/20/2021   PLT 257 04/20/2021   Lab Results  Component Value Date   NA 140 04/20/2021   K 4.2 04/20/2021   CO2 23 04/20/2021   GLUCOSE 155 (H) 04/20/2021   BUN 23 04/20/2021   CREATININE 0.87 04/20/2021   BILITOT 0.2 (L) 11/27/2012   ALKPHOS 59 11/27/2012   AST 18 11/27/2012   ALT 19 11/27/2012   PROT  6.3 11/27/2012   ALBUMIN 3.5 11/27/2012   CALCIUM 9.2 04/20/2021   ANIONGAP 10 04/20/2021   GFR 73.92 05/18/2019   Lab Results  Component Value Date   CHOL 185 08/15/2018   Lab Results  Component Value Date   HDL 49 08/15/2018   Lab Results  Component Value Date   LDLCALC 87 08/15/2018   Lab Results  Component Value Date   TRIG 244 (H) 08/15/2018   Lab Results  Component Value Date   CHOLHDL 3.8 08/15/2018   Lab Results  Component Value Date   HGBA1C 6.0 05/18/2019      Assessment & Plan:   #1 recent  altered consciousness.  Extensive work-up as above.  Back to baseline mental status at this time.  She had used alcohol in combination with tetrahydrocannabinol which likely accounted for her symptoms.  She did have slightly low B12 level of 247 and we have strongly recommended she take over-the-counter B12 1000 mcg daily  #2 dysuria.  Urine dipstick today shows only trace leukocytes.  Doubt clinically significant.  Will send urine culture to be sure  #3 upcoming travels.  Patient requesting prescription for Malarone.  We wrote for Malarone to start 1 tablet daily starting 1 day prior to travel, during travel, and take for 1 week after return -Confirm hepatitis A status.  If not would recommend that.  May also need typhoid vaccine.  She will check with the liter of her group who is helping to  organize   Meds ordered this encounter  Medications   atovaquone-proguanil (MALARONE) 250-100 MG TABS tablet    Sig: Take one tablet daily starting one day prior to departure, during stay, and for one week after return.    Dispense:  20 tablet    Refill:  0    Follow-up: No follow-ups on file.    Carolann Littler, MD

## 2021-05-05 NOTE — Telephone Encounter (Signed)
Pt is calling and would like urine results. Pt was seen today

## 2021-05-07 LAB — URINE CULTURE
MICRO NUMBER:: 13054658
SPECIMEN QUALITY:: ADEQUATE

## 2021-05-08 MED ORDER — NITROFURANTOIN MONOHYD MACRO 100 MG PO CAPS: 100.0000 mg | ORAL_CAPSULE | Freq: Two times a day (BID) | ORAL | 0 refills | Status: DC

## 2021-05-08 NOTE — Telephone Encounter (Signed)
Debra Post, MD  05/07/2021  8:37 PM EST Back to Top    Positive urine cx for E coli- multiple resistances.    Start Macrobid one po bid for 5 days.    Pt notified of above results & verb understanding. Order placed per verbal above.

## 2021-05-09 DIAGNOSIS — M25562 Pain in left knee: Secondary | ICD-10-CM | POA: Diagnosis not present

## 2021-05-09 DIAGNOSIS — M25662 Stiffness of left knee, not elsewhere classified: Secondary | ICD-10-CM | POA: Diagnosis not present

## 2021-05-11 ENCOUNTER — Telehealth: Payer: Self-pay | Admitting: Family Medicine

## 2021-05-11 NOTE — Telephone Encounter (Signed)
Spoke with patient to schedule Medicare Annual Wellness Visit (AWV) either virtually or in office. ? ?Pt stated she would call back not ready to schedule right now ? ?Last AWV 05/20/20 ?please schedule at anytime with [facility] Nurse Health Advisor 1 or 2 ? ? ?This should be a 45 minute visit.  ?

## 2021-05-17 ENCOUNTER — Telehealth: Payer: Self-pay | Admitting: Family Medicine

## 2021-05-17 MED ORDER — NITROFURANTOIN MONOHYD MACRO 100 MG PO CAPS: 100.0000 mg | ORAL_CAPSULE | Freq: Two times a day (BID) | ORAL | 0 refills | Status: DC

## 2021-05-17 NOTE — Telephone Encounter (Signed)
Rx sent and pt aware 

## 2021-05-17 NOTE — Telephone Encounter (Signed)
Patient called in Debra Trevino that the urinary infection has returned and is requesting medication for it. She just finished taking some last week for another urinary infection. ? ?Please advise. ?

## 2021-05-31 ENCOUNTER — Telehealth: Payer: Self-pay | Admitting: Family Medicine

## 2021-05-31 NOTE — Telephone Encounter (Signed)
Pt is calling and it hurts when she urinating and would like to ask md should she her gyn or follow up with him ?

## 2021-05-31 NOTE — Telephone Encounter (Signed)
Pt has been scheduled for tomorrow with Dr. Jerilee Hoh ?

## 2021-06-01 ENCOUNTER — Ambulatory Visit (INDEPENDENT_AMBULATORY_CARE_PROVIDER_SITE_OTHER): Payer: Medicare Other | Admitting: Internal Medicine

## 2021-06-01 ENCOUNTER — Encounter: Payer: Self-pay | Admitting: Internal Medicine

## 2021-06-01 VITALS — BP 120/84 | HR 88 | Temp 97.5°F | Wt 149.9 lb

## 2021-06-01 DIAGNOSIS — N390 Urinary tract infection, site not specified: Secondary | ICD-10-CM

## 2021-06-01 DIAGNOSIS — R300 Dysuria: Secondary | ICD-10-CM | POA: Diagnosis not present

## 2021-06-01 MED ORDER — PREMARIN 0.625 MG/GM VA CREA: 1.0000 | TOPICAL_CREAM | Freq: Every day | VAGINAL | 12 refills | Status: DC

## 2021-06-01 NOTE — Patient Instructions (Signed)
-  Nice seeing you today!! ? ?-Will send your urine out for culture. Will let your know if further antibiotics are needed. ? ?-Kidney US requested. ? ?-Vaginal estrogen cream daily. ?

## 2021-06-01 NOTE — Progress Notes (Signed)
? ? ? ?Established Patient Office Visit ? ? ? ? ?This visit occurred during the SARS-CoV-2 public health emergency.  Safety protocols were in place, including screening questions prior to the visit, additional usage of staff PPE, and extensive cleaning of exam room while observing appropriate contact time as indicated for disinfecting solutions.  ? ? ?CC/Reason for Visit: Recurrent UTI symptoms ? ?HPI: Debra Trevino is a 66 y.o. female who is coming in today for the above mentioned reasons.  She was diagnosed with a resistant E. coli UTI at the end of February and was given 5 days of Macrobid.  She had recurrent symptoms so was given another 5 days of Macrobid after a telephone encounter.  She returns today with lingering symptoms.  She still has some dysuria and frequency.  She does admit that these are improved from her initial episode.  Important to note that she had knee surgery in December and had a Foley catheter during that time.  She was also [facility]. ? ?Past Medical/Surgical History: ?Past Medical History:  ?Diagnosis Date  ? Bilateral foot pain   ? morton neuroma, seeing podiatrist  ? CTS (carpal tunnel syndrome)   ? has seen hand specilist  ? Depression 03/24/2007  ? Qualifier: Diagnosis of  By: Arnoldo Morale MD, Balinda Quails   ? Gastric ulcer   ? per patient  ? GERD (gastroesophageal reflux disease) 05/27/2014  ? Sees Dr. Earlean Shawl; hx erosive esophagitis  ? Hot flashes   ? recur when stops HRT  ? HTN (hypertension)   ? Low back pain   ? chronic  ? Migraines   ? resolved on HRT for hot flases  ? Muscle spasm   ? very active, muscle spasm after tennis matches  ? ? ?Past Surgical History:  ?Procedure Laterality Date  ? CARPAL TUNNEL RELEASE    ? right hand  ? MEDIAL PARTIAL KNEE REPLACEMENT  2017  ? Right knee  ? TONSILLECTOMY    ? ? ?Social History: ? reports that she has been smoking cigarettes. She has a 50.00 pack-year smoking history. She has never  used smokeless tobacco. She reports current alcohol use. She reports that she does not use drugs. ? ?Allergies: ?Allergies  ?Allergen Reactions  ? Penicillins Swelling  ?  Has patient had a PCN reaction causing immediate rash, facial/tongue/throat swelling, SOB or lightheadedness with hypotension:YES ?Has patient had a PCN reaction causing severe rash involving mucus membranes or skin necrosis: NO ?Has patient had a PCN reaction that required hospitalization NO ?Has patient had a PCN reaction occurring within the last 10 years: NO ?If all of the above answers are "NO", then may proceed with Cephalosporin use.  ? Tape Rash  ?  Pt is not sure if paper tape also causes the rash. Reaction was to surgical tape  ? ? ?Family History:  ?Family History  ?Problem Relation Age of Onset  ? Charcot-Marie-Tooth disease Father   ? Renal cancer Mother   ? Schizophrenia Brother   ? Hypertension Other   ? ? ? ?Current Outpatient Medications:  ?  Acetaminophen (TYLENOL PO), Take 1 tablet by mouth daily as needed (prior to physical therapy)., Disp: , Rfl:  ?  atovaquone-proguanil (MALARONE) 250-100 MG TABS tablet, Take one tablet daily starting one day prior to departure, during stay, and for one week after return., Disp: 20 tablet, Rfl: 0 ?  bisoprolol-hydrochlorothiazide (ZIAC) 2.5-6.25 MG tablet, TAKE 1 TABLET BY  MOUTH EVERY DAY (Patient taking differently: Take 1 tablet by mouth at bedtime.), Disp: 90 tablet, Rfl: 1 ?  citalopram (CELEXA) 20 MG tablet, TAKE 1 TABLET BY MOUTH EVERY DAY (Patient taking differently: Take 20 mg by mouth every morning.), Disp: 90 tablet, Rfl: 0 ?  conjugated estrogens (PREMARIN) vaginal cream, Place 1 Applicatorful vaginally daily., Disp: 42.5 g, Rfl: 12 ?  cyclobenzaprine (FLEXERIL) 10 MG tablet, Take 10 mg by mouth at bedtime as needed for muscle spasms., Disp: , Rfl:  ?  diclofenac Sodium (VOLTAREN) 1 % GEL, Apply 1 application topically 2 (two) times daily as needed (pain)., Disp: , Rfl:  ?   gabapentin (NEURONTIN) 100 MG capsule, Take 100 mg by mouth at bedtime. , Disp: , Rfl:  ?  meloxicam (MOBIC) 15 MG tablet, Take 15 mg by mouth every morning., Disp: , Rfl:  ?  omeprazole (PRILOSEC) 20 MG capsule, Take 20 mg by mouth every morning., Disp: , Rfl: 3 ? ?Review of Systems:  ?Constitutional: Denies fever, chills, diaphoresis, appetite change and fatigue.  ?HEENT: Denies photophobia, eye pain, redness, hearing loss, ear pain, congestion, sore throat, rhinorrhea, sneezing, mouth sores, trouble swallowing, neck pain, neck stiffness and tinnitus.   ?Respiratory: Denies SOB, DOE, cough, chest tightness,  and wheezing.   ?Cardiovascular: Denies chest pain, palpitations and leg swelling.  ?Gastrointestinal: Denies nausea, vomiting, abdominal pain, diarrhea, constipation, blood in stool and abdominal distention.  ?Genitourinary: Denies dysuria, urgency, frequency.  ?Endocrine: Denies: hot or cold intolerance, sweats, changes in hair or nails, polyuria, polydipsia. ?Musculoskeletal: Denies myalgias, back pain, joint swelling, arthralgias and gait problem.  ?Skin: Denies pallor, rash and wound.  ?Neurological: Denies dizziness, seizures, syncope, weakness, light-headedness, numbness and headaches.  ?Hematological: Denies adenopathy. Easy bruising, personal or family bleeding history  ?Psychiatric/Behavioral: Denies suicidal ideation, mood changes, confusion, nervousness, sleep disturbance and agitation ? ? ? ?Physical Exam: ?Vitals:  ? 06/01/21 0812  ?BP: 120/84  ?Pulse: 88  ?Temp: (!) 97.5 ?F (36.4 ?C)  ?TempSrc: Oral  ?SpO2: 98%  ?Weight: 149 lb 14.4 oz (68 kg)  ? ? ?Body mass index is 27.42 kg/m?. ? ? ?Constitutional: NAD, calm, comfortable ?Eyes: PERRL, lids and conjunctivae normal ?ENMT: Mucous membranes are moist.  ?Psychiatric: Normal judgment and insight. Alert and oriented x 3. Normal mood.  ? ? ?Impression and Plan: ? ?Dysuria - Plan: POCT urinalysis dipstick ? ?Recurrent UTI - Plan: Urinalysis with  Reflex Microscopic, Culture, Urine, conjugated estrogens (PREMARIN) vaginal cream, US Renal ? ?-Since she has already had 2 courses of antibiotics and she had a pan resistant E. coli, we have discussed withholding antibiotics for now pending formal urine culture.  She is unable to provide a sample just yet and will return later in the day to do so.  Since this is her third episode I will do some imaging to rule out an anatomical cause for her recurrent UTIs, renal ultrasound has been requested.  We have also discussed the use of vaginal estrogen cream as it has been shown to reduce frequency of UTIs in postmenopausal woman, she agrees to try. ? ?Time spent: 31 minutes reviewing chart, interviewing and examining patient and formulating plan of care. ? ? ?Patient Instructions  ?-Nice seeing you today!! ? ?-Will send your urine out for culture. Will let your know if further antibiotics are needed. ? ?-Kidney US requested. ? ?-Vaginal estrogen cream daily. ? ? ? ?Lelon Frohlich, MD ?[facility] ? ? ?

## 2021-06-02 LAB — URINALYSIS, ROUTINE W REFLEX MICROSCOPIC
Bilirubin Urine: NEGATIVE
Hgb urine dipstick: NEGATIVE
Ketones, ur: NEGATIVE
Nitrite: NEGATIVE
RBC / HPF: NONE SEEN (ref 0–?)
Specific Gravity, Urine: 1.02 (ref 1.000–1.030)
Total Protein, Urine: NEGATIVE
Urine Glucose: NEGATIVE
Urobilinogen, UA: 0.2 (ref 0.0–1.0)
pH: 6 (ref 5.0–8.0)

## 2021-06-03 LAB — URINE CULTURE
MICRO NUMBER:: 13170848
SPECIMEN QUALITY:: ADEQUATE

## 2021-06-07 ENCOUNTER — Telehealth: Payer: Self-pay | Admitting: Family Medicine

## 2021-06-07 MED ORDER — CIPROFLOXACIN HCL 500 MG PO TABS: 500.0000 mg | ORAL_TABLET | Freq: Two times a day (BID) | ORAL | 0 refills | Status: AC

## 2021-06-07 NOTE — Telephone Encounter (Signed)
Patient is aware and rx sent ?

## 2021-06-07 NOTE — Telephone Encounter (Signed)
Pt saw dr Jerilee Hoh on 06-01-2021 and would like urine results ?

## 2021-06-15 ENCOUNTER — Telehealth: Payer: Self-pay | Admitting: *Deleted

## 2021-06-15 NOTE — Chronic Care Management (AMB) (Signed)
?  Care Management  ? ?Outreach Note ? ?06/15/2021 ?Name: Debra Trevino MRN: [id number] DOB: [birth year] ? ?Referred by: Eulas Post, MD ?Reason for referral : Care Coordination (Initial outreach to schedule with RNCM ) ? ? ?An unsuccessful telephone outreach was attempted today. The patient was referred to the case management team for assistance with care management and care coordination.  ? ?Follow Up Plan:  ?The care management team will reach out to the patient again over the next 14 days.  ?If patient returns call to provider office, please advise to call [place]* at [phone].* ? ?Laverda Sorenson  ?Care Guide, Embedded Care Coordination ?[place]  Care Management  ?Direct Dial: [phone] ? ?

## 2021-06-22 ENCOUNTER — Other Ambulatory Visit: Payer: Self-pay | Admitting: Family Medicine

## 2021-06-22 ENCOUNTER — Telehealth: Payer: Self-pay | Admitting: Family Medicine

## 2021-06-22 NOTE — Telephone Encounter (Signed)
Left message for patient to call back and schedule Medicare Annual Wellness Visit (AWV) either virtually or in office. Left  my jabber number [phone]   Last AWV ; 05/20/20 please schedule at anytime with [facility] Nurse Health Advisor 1 or 2    

## 2021-06-23 ENCOUNTER — Telehealth (INDEPENDENT_AMBULATORY_CARE_PROVIDER_SITE_OTHER): Payer: Medicare Other | Admitting: Family Medicine

## 2021-06-23 ENCOUNTER — Encounter: Payer: Self-pay | Admitting: Family Medicine

## 2021-06-23 VITALS — Ht 62.0 in | Wt 149.0 lb

## 2021-06-23 DIAGNOSIS — R0981 Nasal congestion: Secondary | ICD-10-CM | POA: Diagnosis not present

## 2021-06-23 DIAGNOSIS — R059 Cough, unspecified: Secondary | ICD-10-CM

## 2021-06-23 MED ORDER — PROMETHAZINE-DM 6.25-15 MG/5ML PO SYRP: 5.0000 mL | ORAL_SOLUTION | Freq: Two times a day (BID) | ORAL | 0 refills | Status: DC | PRN

## 2021-06-23 NOTE — Patient Instructions (Signed)
?  HOME CARE TIPS: ? ?-COVID19 testing information: ?ForwardDrop.tn ? ?Most pharmacies also offer testing and home test kits. If the Covid19 test is positive and you desire antiviral treatment, please contact a [facility] or schedule a follow up virtual visit through your primary care office or through the [employer].  ?Other test to treat options: ?ConnectRV.is?click_source=alert ? ?-I sent the medication(s) we discussed to your pharmacy: ?Meds ordered this encounter  ?Medications  ? promethazine-dextromethorphan (PROMETHAZINE-DM) 6.25-15 MG/5ML syrup  ?  Sig: Take 5 mLs by mouth 2 (two) times daily as needed for cough.  ?  Dispense:  118 mL  ?  Refill:  0  ?  ? ?-can use tylenol f needed for fevers, aches and pains per instructions ? ?-nasal saline sinus rinses twice daily ? ?-stay hydrated, drink plenty of fluids and eat small healthy meals - avoid dairy ? ?-can take 1000 IU (57mg) Vit D3 and 100-500 mg of Vit C daily per instructions ? ?-follow up with your doctor in 2-3 days unless improving and feeling better ? ?-stay home while sick, except to seek medical care. If you have COVID19, you will likely be contagious for 7-10 days. Flu or Influenza is likely contagious for about 7 days. Other respiratory viral infections remain contagious for 5-10+ days depending on the virus and many other factors. Wear a good mask that fits snugly (such as N95 or KN95) if around others to reduce the risk of transmission. ? ?It was nice to meet you today, and I really hope you are feeling better soon. I help [facility] out with telemedicine visits on Tuesdays and Thursdays and am happy to help if you need a follow up virtual visit on those days. Otherwise, if you have any concerns or questions following this visit please schedule a follow up visit with your Primary Care doctor or seek care at a local urgent care clinic to avoid delays in care.  ? ? ?Seek in person  care or schedule a follow up video visit promptly if your symptoms worsen, new concerns arise or you are not improving with treatment. Call 911 and/or seek emergency care if your symptoms are severe or life threatening. ? ? ?

## 2021-06-23 NOTE — Progress Notes (Signed)
Virtual Visit via Telephone Note ? ?I connected with Debra Trevino on 06/23/21 at  2:40 PM EDT by telephone and verified that I am speaking with the correct person using two identifiers. ?  ?I discussed the limitations of performing an evaluation and management service by telephone and requested permission for a phone visit. The patient expressed understanding and agreed to proceed. ? ?Location patient:  [place] ?Location provider: work or home office ?Participants present for the call: patient, provider ?Patient did not have a visit with me in the prior 7 days to address this/these issue(s). ? ? ?History of Present Illness: ? ?Acute telemedicine visit for cough and congestion: ?-Onset: started 2 days ago ?-Symptoms include: cough, sometimes productive, pnd, sinus congestion, fever initially - now resolved, eyes a little watery ?-Denies:NVD, fever today, CP or SOB today (reports might have had a little sob a few days ago) ?-Has tried:OTC cough medication ?-she was with a friend at a tennis tournament this weekend and her friend was sick ?-has not done a covid test ?-Pertinent past medical history: see below ?-Pertinent medication allergies:  ?Allergies  ?Allergen Reactions  ? Penicillins Swelling  ?  Has patient had a PCN reaction causing immediate rash, facial/tongue/throat swelling, SOB or lightheadedness with hypotension:YES ?Has patient had a PCN reaction causing severe rash involving mucus membranes or skin necrosis: NO ?Has patient had a PCN reaction that required hospitalization NO ?Has patient had a PCN reaction occurring within the last 10 years: NO ?If all of the above answers are "NO", then may proceed with Cephalosporin use.  ? Tape Rash  ?  Pt is not sure if paper tape also causes the rash. Reaction was to surgical tape  ?-COVID-19 vaccine status: ?Immunization History  ?Administered Date(s) Administered  ? Fluad Quad(high Dose 65+) 12/02/2018, 12/26/2020  ? Influenza Split 11/27/2011  ? Influenza Whole  02/15/2009  ? Influenza,inj,Quad PF,6+ Mos 12/04/2012, 11/30/2014, 01/13/2018  ? Influenza-Unspecified 12/19/2016, 12/10/2019  ? PFIZER Comirnaty(Gray Top)Covid-19 Tri-Sucrose Vaccine 08/22/2020  ? PFIZER(Purple Top)SARS-COV-2 Vaccination 04/03/2019, 04/22/2019, 12/10/2019  ? Pneumococcal Conjugate-13 12/19/2018  ? Pneumococcal Polysaccharide-23 12/02/2018  ? Td 06/11/1998  ? Tdap 12/04/2012  ? Zoster, Live 08/16/2014  ? ? ?  ?Past Medical History:  ?Diagnosis Date  ? Bilateral foot pain   ? morton neuroma, seeing podiatrist  ? CTS (carpal tunnel syndrome)   ? has seen hand specilist  ? Depression 03/24/2007  ? Qualifier: Diagnosis of  By: Arnoldo Morale MD, Balinda Quails   ? Gastric ulcer   ? per patient  ? GERD (gastroesophageal reflux disease) 05/27/2014  ? Sees Dr. Earlean Shawl; hx erosive esophagitis  ? Hot flashes   ? recur when stops HRT  ? HTN (hypertension)   ? Low back pain   ? chronic  ? Migraines   ? resolved on HRT for hot flases  ? Muscle spasm   ? very active, muscle spasm after tennis matches  ? ? ?Current Outpatient Medications on File Prior to Visit  ?Medication Sig Dispense Refill  ? Acetaminophen (TYLENOL PO) Take 1 tablet by mouth daily as needed (prior to physical therapy).    ? atovaquone-proguanil (MALARONE) 250-100 MG TABS tablet Take one tablet daily starting one day prior to departure, during stay, and for one week after return. 20 tablet 0  ? bisoprolol-hydrochlorothiazide (ZIAC) 2.5-6.25 MG tablet TAKE 1 TABLET BY MOUTH EVERY DAY (Patient taking differently: Take 1 tablet by mouth at bedtime.) 90 tablet 1  ? citalopram (CELEXA) 20 MG tablet TAKE 1  TABLET BY MOUTH EVERY DAY 90 tablet 0  ? conjugated estrogens (PREMARIN) vaginal cream Place 1 Applicatorful vaginally daily. 42.5 g 12  ? cyclobenzaprine (FLEXERIL) 10 MG tablet Take 10 mg by mouth at bedtime as needed for muscle spasms.    ? diclofenac Sodium (VOLTAREN) 1 % GEL Apply 1 application topically 2 (two) times daily as needed (pain).    ? gabapentin  (NEURONTIN) 100 MG capsule Take 100 mg by mouth at bedtime.     ? meloxicam (MOBIC) 15 MG tablet Take 15 mg by mouth every morning.    ? omeprazole (PRILOSEC) 20 MG capsule Take 20 mg by mouth every morning.  3  ? ?No current facility-administered medications on file prior to visit.  ? ? ?Observations/Objective: ?Patient sounds cheerful and well on the phone. ?I do not appreciate any SOB. ?Speech and thought processing are grossly intact. ?Patient reported vitals: ? ?Assessment and Plan: ? ?Cough, unspecified type ? ?Nasal congestion ? ?-we discussed possible serious and likely etiologies, options for evaluation and workup, limitations of telemedicine visit vs in person visit, treatment, treatment risks and precautions. Pt prefers to treat via telemedicine empirically rather than in person at this moment. Query VURI, influenza (though have not seen much in the community recently), covid vs other. Since she seems to be improving opted for symptomatic care and advised covid testing and options for tx if positive (can do f/u vv or contact [facility]. She wanted a cough syrup that would help her sleep. Discussed options/risks and sent promethazine DM.  ?Advised to seek prompt virtual visit or in person care if worsening, new symptoms arise, or if is not improving with treatment as expected per our conversation of expected course. Discussed options for follow up care.  ?  ?I discussed the assessment and treatment plan with the patient. The patient was provided an opportunity to ask questions and all were answered. The patient agreed with the plan and demonstrated an understanding of the instructions. ?  ? ?Follow Up Instructions: ? ?I did not refer this patient for an OV with me in the next 24 hours for this/these issue(s). ? ?I discussed the assessment and treatment plan with the patient. The patient was provided an opportunity to ask questions and all were answered. The patient agreed with the plan and  demonstrated an understanding of the instructions. ? ? ?I spent 13 minutes on the date of this visit in the care of this patient. See summary of tasks completed to properly care for this patient in the detailed notes above which also included counseling of above, review of PMH, medications, allergies, evaluation of the patient and ordering and/or  instructing patient on testing and care options.   ? ? ?Lucretia Kern, DO  ? ?

## 2021-06-26 NOTE — Chronic Care Management (AMB) (Signed)
?  Care Management  ? ?Note ? ?06/26/2021 ?Name: Debra Trevino MRN: [id number] DOB: Nov 28, 1955 ? ?[name] is a 66 y.o. year old female who is a primary care patient of Burchette, Alinda Sierras, MD. I reached out to Estill Dooms by phone today offer care coordination services.  ? ?[name] was given information about care management services today including:  ?Care management services include personalized support from designated clinical staff supervised by her physician, including individualized plan of care and coordination with other care providers ?24/7 contact phone numbers for assistance for urgent and routine care needs. ?The patient may stop care management services at any time by phone call to the office staff. ? ?Patient agreed to services and verbal consent obtained.  ? ?Follow up plan: ?Telephone appointment with care management team member scheduled for:06/30/21 ?Laverda Sorenson  ?Care Guide, Embedded Care Coordination ?[facility]  Care Management  ?Direct Dial: [phone] ? ?

## 2021-06-30 ENCOUNTER — Ambulatory Visit: Payer: Medicare Other

## 2021-06-30 DIAGNOSIS — M179 Osteoarthritis of knee, unspecified: Secondary | ICD-10-CM

## 2021-06-30 DIAGNOSIS — I10 Essential (primary) hypertension: Secondary | ICD-10-CM

## 2021-06-30 NOTE — Patient Instructions (Signed)
Visit Information  Thank you for allowing me to share the care management and care coordination services that are available to you as part of your health plan and services through your primary care provider and medical home. Please reach out to me at [phone] if the care management/care coordination team may be of assistance to you in the future.   Jess Sulak RN, BSN,CCM, CDE Care Management Coordinator [facility] [phone]   

## 2021-06-30 NOTE — Chronic Care Management (AMB) (Signed)
?  Care Management  ? ?Follow Up Note ? ? ?06/30/2021 ?Name: Debra Trevino MRN: [id number] DOB: [birth year] ? ? ?Referred by: Eulas Post, MD ?Reason for referral : Care Coordination (RNCM: Initial Outreach Care coordination needs) ? ? ?Successful contact was made with the patient to discuss care management and care coordination services. Patient declines engagement at this time.  No care coordination/ care management needs identified at this time ? ?Follow Up Plan: No further follow up required: no care management/care coordination needs identified at this time ? ?Peter Garter RN, BSN,CCM, CDE ?Care Management Coordinator ? [facility] ?[phone]  ? ?

## 2021-07-05 DIAGNOSIS — L821 Other seborrheic keratosis: Secondary | ICD-10-CM | POA: Diagnosis not present

## 2021-07-05 DIAGNOSIS — D225 Melanocytic nevi of trunk: Secondary | ICD-10-CM | POA: Diagnosis not present

## 2021-07-05 DIAGNOSIS — L723 Sebaceous cyst: Secondary | ICD-10-CM | POA: Diagnosis not present

## 2021-07-05 DIAGNOSIS — L218 Other seborrheic dermatitis: Secondary | ICD-10-CM | POA: Diagnosis not present

## 2021-07-05 DIAGNOSIS — Z85828 Personal history of other malignant neoplasm of skin: Secondary | ICD-10-CM | POA: Diagnosis not present

## 2021-07-17 ENCOUNTER — Telehealth: Payer: Self-pay | Admitting: Family Medicine

## 2021-07-17 DIAGNOSIS — U071 COVID-19: Secondary | ICD-10-CM | POA: Diagnosis not present

## 2021-07-17 NOTE — Telephone Encounter (Signed)
Left message for patient to call back and schedule Medicare Annual Wellness Visit (AWV) either virtually or in office. Left  my jabber number [phone]   Last AWV ; 05/20/20 please schedule at anytime with [facility] Nurse Health Advisor 1 or 2    

## 2021-07-25 ENCOUNTER — Ambulatory Visit (INDEPENDENT_AMBULATORY_CARE_PROVIDER_SITE_OTHER): Payer: Medicare Other | Admitting: Family Medicine

## 2021-07-25 ENCOUNTER — Encounter: Payer: Self-pay | Admitting: Family Medicine

## 2021-07-25 VITALS — BP 118/70 | HR 85 | Temp 97.3°F | Ht 62.0 in | Wt 152.0 lb

## 2021-07-25 DIAGNOSIS — R300 Dysuria: Secondary | ICD-10-CM | POA: Diagnosis not present

## 2021-07-25 DIAGNOSIS — Z23 Encounter for immunization: Secondary | ICD-10-CM

## 2021-07-25 DIAGNOSIS — Z7184 Encounter for health counseling related to travel: Secondary | ICD-10-CM

## 2021-07-25 DIAGNOSIS — M545 Low back pain, unspecified: Secondary | ICD-10-CM | POA: Diagnosis not present

## 2021-07-25 LAB — POC URINALSYSI DIPSTICK (AUTOMATED)
Bilirubin, UA: NEGATIVE
Blood, UA: NEGATIVE
Glucose, UA: NEGATIVE
Ketones, UA: NEGATIVE
Leukocytes, UA: NEGATIVE
Nitrite, UA: NEGATIVE
Protein, UA: NEGATIVE
Spec Grav, UA: 1.025 (ref 1.010–1.025)
Urobilinogen, UA: 0.2 E.U./dL
pH, UA: 6 (ref 5.0–8.0)

## 2021-07-25 MED ORDER — VIVOTIF PO CPDR: 1.0000 | DELAYED_RELEASE_CAPSULE | ORAL | 0 refills | Status: DC

## 2021-07-25 MED ORDER — CYCLOBENZAPRINE HCL 10 MG PO TABS: 10.0000 mg | ORAL_TABLET | Freq: Every day | ORAL | 0 refills | Status: DC

## 2021-07-25 NOTE — Addendum Note (Signed)
Addended by: Nilda Riggs on: 07/25/2021 04:27 PM ? ? Modules accepted: Orders ? ?

## 2021-07-25 NOTE — Progress Notes (Signed)
? ?Established Patient Office Visit ? ?Subjective   ?Patient ID: Debra Trevino, female    DOB: [birth year]  Age: 66 y.o. MRN: [id number] ? ?Chief Complaint  ?Patient presents with  ? Recurrent UTI  ? Dysuria  ?  Patient complains of dysuria,  ?  ? Travel Consult  ? ? ?HPI ? ? ?Debra Trevino is here to discuss several items as follows ? ?She has had frequent UTIs recently.  She had a couple back-to-back E. coli UTIs.  These were resistant to several antibiotics.  Her symptoms did eventually clear.  She started couple days ago with some mild vaginal itching and mild suprapubic discomfort.  No burning with urination currently.  No fevers or chills.  No flank pain.  No gross hematuria. ? ?She did start having some midline low back pain Saturday.  She wondered if this may have been due to yard work.  Denies specific injury.  No radiculitis symptoms.  Muscles feel tight.  Has taken Flexeril in the past and would like refill. ? ?Upcoming travel to [place] end of June.  Needs hepatitis A vaccine as well as typhoid vaccine.  No known contraindications to Vivotif.  Tetanus due next year.  Has not had shingles vaccine and she would like to get this through the pharmacy.  She does have Malarone already prescribed for malaria prevention.  Will not need yellow fever vaccine. ? ?Past Medical History:  ?Diagnosis Date  ? Bilateral foot pain   ? morton neuroma, seeing podiatrist  ? CTS (carpal tunnel syndrome)   ? has seen hand specilist  ? Depression 03/24/2007  ? Qualifier: Diagnosis of  By: Arnoldo Morale MD, Balinda Quails   ? Gastric ulcer   ? per patient  ? GERD (gastroesophageal reflux disease) 05/27/2014  ? Sees Dr. Earlean Shawl; hx erosive esophagitis  ? Hot flashes   ? recur when stops HRT  ? HTN (hypertension)   ? Low back pain   ? chronic  ? Migraines   ? resolved on HRT for hot flases  ? Muscle spasm   ? very active, muscle spasm after tennis matches  ? ?Past Surgical History:  ?Procedure Laterality Date  ? CARPAL TUNNEL RELEASE    ? right hand   ? MEDIAL PARTIAL KNEE REPLACEMENT  2017  ? Right knee  ? TONSILLECTOMY    ? ? reports that she has been smoking cigarettes. She has a 50.00 pack-year smoking history. She has never used smokeless tobacco. She reports current alcohol use. She reports that she does not use drugs. ?family history includes Charcot-Marie-Tooth disease in her father; Hypertension in an other family member; Renal cancer in her mother; Schizophrenia in her brother. ?Allergies  ?Allergen Reactions  ? Penicillins Swelling  ?  Has patient had a PCN reaction causing immediate rash, facial/tongue/throat swelling, SOB or lightheadedness with hypotension:YES ?Has patient had a PCN reaction causing severe rash involving mucus membranes or skin necrosis: NO ?Has patient had a PCN reaction that required hospitalization NO ?Has patient had a PCN reaction occurring within the last 10 years: NO ?If all of the above answers are "NO", then may proceed with Cephalosporin use.  ? Tape Rash  ?  Pt is not sure if paper tape also causes the rash. Reaction was to surgical tape  ? ? ?Review of Systems  ?Constitutional:  Negative for chills and fever.  ?Respiratory:  Negative for cough.   ?Cardiovascular:  Negative for chest pain.  ?Genitourinary:  Positive for dysuria. Negative for flank pain  and hematuria.  ?Musculoskeletal:  Positive for back pain.  ? ?  ?Objective:  ?  ? ?BP 118/70 (BP Location: Left Arm, Patient Position: Sitting, Cuff Size: Normal)   Pulse 85   Temp (!) 97.3 ?F (36.3 ?C) (Oral)   Ht '5\' 2"'$  (1.575 m)   Wt 152 lb (68.9 kg)   SpO2 98%   BMI 27.80 kg/m?  ? ? ?Physical Exam ?Vitals reviewed.  ?Constitutional:   ?   Appearance: Normal appearance.  ?Cardiovascular:  ?   Rate and Rhythm: Normal rate and regular rhythm.  ?Pulmonary:  ?   Effort: Pulmonary effort is normal.  ?   Breath sounds: Normal breath sounds.  ?Musculoskeletal:  ?   Comments: Mild tenderness midline lower lumbar region.  ?Neurological:  ?   Mental Status: She is alert.  ?    Comments: Full strength lower extremities  ? ? ? ?No results found for any visits on 07/25/21. ? ? ? ?The 10-year ASCVD risk score (Arnett DK, et al., 2019) is: 14.7% ? ?  ?Assessment & Plan:  ? ?#1 recurrent dysuria.  History of recent UTI with E. coli.  Recheck urine dipstick.  Patient unable to give specimen in office.  She was given container to bring back.  Send for culture if suspicious. ? ?#2 travel advice encounter.  Upcoming travel to [place].  Needs hepatitis A vaccine and this was given.  She is aware that she needs 23-monthbooster for full vaccine.  Prescription written for oral typhoid vaccine.  She is aware this cannot be taken concomitant with any antibiotics we will wait to clarify urinary situation first.  She already has Malarone prescribed for malaria prevention. ? ?#3 low back pain.  Suspect muscular strain.  Reviewed extension stretches.  Reviewed Flexeril to take as needed at night.  She is aware of potential for sedation.  Follow-up for any persistent or worsening symptoms. ? ? ?No follow-ups on file.  ? ? ?BCarolann Littler MD ? ?

## 2021-07-27 ENCOUNTER — Telehealth: Payer: Self-pay | Admitting: Family Medicine

## 2021-07-27 NOTE — Telephone Encounter (Signed)
Patient would like callback on UA results from Tuesday.        Please advise

## 2021-07-27 NOTE — Telephone Encounter (Signed)
Pt given results and expressed understanding

## 2021-08-09 DIAGNOSIS — H35033 Hypertensive retinopathy, bilateral: Secondary | ICD-10-CM | POA: Diagnosis not present

## 2021-08-09 DIAGNOSIS — H524 Presbyopia: Secondary | ICD-10-CM | POA: Diagnosis not present

## 2021-08-09 DIAGNOSIS — H04123 Dry eye syndrome of bilateral lacrimal glands: Secondary | ICD-10-CM | POA: Diagnosis not present

## 2021-08-09 DIAGNOSIS — H25813 Combined forms of age-related cataract, bilateral: Secondary | ICD-10-CM | POA: Diagnosis not present

## 2021-08-12 ENCOUNTER — Other Ambulatory Visit: Payer: Self-pay | Admitting: Family Medicine

## 2021-08-14 NOTE — Telephone Encounter (Signed)
Last refill-07/25/21--20 tab, 0 refills Last OV- 07/25/21  No future OV scheduled.   Can this patient receive a refill?

## 2021-08-22 ENCOUNTER — Telehealth: Payer: Self-pay | Admitting: Family Medicine

## 2021-08-22 MED ORDER — DOXYCYCLINE HYCLATE 100 MG PO TABS: ORAL_TABLET | ORAL | 0 refills | Status: DC

## 2021-08-22 NOTE — Telephone Encounter (Signed)
Pt call and state the mediction for anti malaria cause her stomach to hurt and nausea and she want something else call in for her

## 2021-08-22 NOTE — Telephone Encounter (Signed)
Rx sent and pt aware 

## 2021-08-25 NOTE — Telephone Encounter (Signed)
error 

## 2021-09-01 ENCOUNTER — Other Ambulatory Visit: Payer: Medicare Other

## 2021-09-09 ENCOUNTER — Other Ambulatory Visit: Payer: Self-pay | Admitting: Family Medicine

## 2021-09-20 ENCOUNTER — Other Ambulatory Visit: Payer: Self-pay | Admitting: Family Medicine

## 2021-09-22 ENCOUNTER — Other Ambulatory Visit: Payer: Self-pay | Admitting: Acute Care

## 2021-09-22 ENCOUNTER — Other Ambulatory Visit: Payer: Self-pay | Admitting: *Deleted

## 2021-09-22 DIAGNOSIS — F17210 Nicotine dependence, cigarettes, uncomplicated: Secondary | ICD-10-CM

## 2021-09-22 DIAGNOSIS — Z122 Encounter for screening for malignant neoplasm of respiratory organs: Secondary | ICD-10-CM

## 2021-09-25 ENCOUNTER — Inpatient Hospital Stay: Admission: RE | Admit: 2021-09-25 | Payer: Medicare Other | Source: Ambulatory Visit

## 2021-10-05 ENCOUNTER — Other Ambulatory Visit: Payer: Self-pay | Admitting: Family Medicine

## 2021-10-05 DIAGNOSIS — M545 Low back pain, unspecified: Secondary | ICD-10-CM | POA: Diagnosis not present

## 2021-10-06 ENCOUNTER — Other Ambulatory Visit: Payer: Self-pay

## 2021-10-06 NOTE — Telephone Encounter (Signed)
Patient request refills on Omeprazole '20mg'$ 

## 2021-10-06 NOTE — Telephone Encounter (Signed)
Left a message for the pt to return my call.  

## 2021-10-08 ENCOUNTER — Other Ambulatory Visit: Payer: Self-pay | Admitting: Family Medicine

## 2021-10-09 ENCOUNTER — Ambulatory Visit (INDEPENDENT_AMBULATORY_CARE_PROVIDER_SITE_OTHER): Payer: Medicare Other

## 2021-10-09 VITALS — Ht 62.0 in | Wt 150.0 lb

## 2021-10-09 DIAGNOSIS — Z0000 Encounter for general adult medical examination without abnormal findings: Secondary | ICD-10-CM

## 2021-10-09 MED ORDER — OMEPRAZOLE 20 MG PO CPDR: 20.0000 mg | DELAYED_RELEASE_CAPSULE | Freq: Every morning | ORAL | 3 refills | Status: AC

## 2021-10-09 NOTE — Progress Notes (Signed)
I connected with Debra Trevino today by telephone and verified that I am speaking with the correct person using two identifiers. Location patient: home Location provider: work Persons participating in the virtual visit: Debra Trevino, [place] LPN.   I discussed the limitations, risks, security and privacy concerns of performing an evaluation and management service by telephone and the availability of in person appointments. I also discussed with the patient that there may be a patient responsible charge related to this service. The patient expressed understanding and verbally consented to this telephonic visit.    Interactive audio and video telecommunications were attempted between this provider and patient, however failed, due to patient having technical difficulties OR patient did not have access to video capability.  We continued and completed visit with audio only.     Vital signs may be patient reported or missing.  Subjective:   Debra Trevino is a 66 y.o. female who presents for Medicare Annual (Subsequent) preventive examination.  Review of Systems     Cardiac Risk Factors include: advanced age (>17mn, >>69women);hypertension     Objective:    Today's Vitals   10/09/21 1041 10/09/21 1043  Weight: 150 lb (68 kg)   Height: '5\' 2"'$  (1.575 m)   PainSc:  3    Body mass index is 27.44 kg/m.     10/09/2021   10:49 AM 06/30/2021   11:35 AM 04/20/2021   10:05 PM 09/08/2020   10:53 AM 05/20/2020    9:19 AM 06/24/2019    6:34 AM 01/02/2016    3:55 PM  Advanced Directives  Does Patient Have a Medical Advance Directive? Yes Yes No Yes Yes No Yes  Type of [place] will [place]  [place] will;Out of facility DNR (pink MOST or yellow form) [place]Living will  Living will  Does patient want to make changes to medical advance directive?  No - Patient  declined   No - Patient declined  No - Patient declined  Copy of [place]? No - copy requested No - copy requested   No - copy requested    Would patient like information on creating a medical advance directive?      No - Patient declined     Current Medications (verified) Outpatient Encounter Medications as of 10/09/2021  Medication Sig   Acetaminophen (TYLENOL PO) Take 1 tablet by mouth daily as needed (prior to physical therapy).   bisoprolol-hydrochlorothiazide (ZIAC) 2.5-6.25 MG tablet TAKE 1 TABLET BY MOUTH EVERY DAY   citalopram (CELEXA) 20 MG tablet TAKE 1 TABLET BY MOUTH EVERY DAY   cyclobenzaprine (FLEXERIL) 10 MG tablet TAKE 1 TABLET BY MOUTH EVERYDAY AT BEDTIME   diclofenac Sodium (VOLTAREN) 1 % GEL Apply 1 application topically 2 (two) times daily as needed (pain).   doxycycline (VIBRA-TABS) 100 MG tablet Take 1 tablet by mouth daily starting 1 to 2 days prior to travel, during travel, and for 4 weeks after return   gabapentin (NEURONTIN) 100 MG capsule Take 100 mg by mouth at bedtime.    meloxicam (MOBIC) 15 MG tablet Take 15 mg by mouth every morning.   omeprazole (PRILOSEC) 20 MG capsule Take 1 capsule (20 mg total) by mouth every morning.   typhoid (VIVOTIF) DR capsule Take 1 capsule by mouth every other day.   conjugated estrogens (PREMARIN) vaginal cream Place 1 Applicatorful vaginally daily. (Patient not taking: Reported on 10/09/2021)  promethazine-dextromethorphan (PROMETHAZINE-DM) 6.25-15 MG/5ML syrup Take 5 mLs by mouth 2 (two) times daily as needed for cough. (Patient not taking: Reported on 10/09/2021)   No facility-administered encounter medications on file as of 10/09/2021.    Allergies (verified) Penicillins and Tape   History: Past Medical History:  Diagnosis Date   Bilateral foot pain    morton neuroma, seeing podiatrist   CTS (carpal tunnel syndrome)    has seen hand specilist   Depression 03/24/2007   Qualifier: Diagnosis of   By: Arnoldo Morale MD, Balinda Quails    Gastric ulcer    per patient   GERD (gastroesophageal reflux disease) 05/27/2014   Sees Dr. Earlean Shawl; hx erosive esophagitis   Hot flashes    recur when stops HRT   HTN (hypertension)    Low back pain    chronic   Migraines    resolved on HRT for hot flases   Muscle spasm    very active, muscle spasm after tennis matches   Past Surgical History:  Procedure Laterality Date   CARPAL TUNNEL RELEASE     right hand   MEDIAL PARTIAL KNEE REPLACEMENT  2017   Right knee   REVISION TOTAL KNEE ARTHROPLASTY Left    02/2021   TONSILLECTOMY     Family History  Problem Relation Age of Onset   Charcot-Marie-Tooth disease Father    Renal cancer Mother    Schizophrenia Brother    Hypertension Other    Social History   Socioeconomic History   Marital status: Married    Spouse name: Not on file   Number of children: Not on file   Years of education: Not on file   Highest education level: Not on file  Occupational History   Not on file  Tobacco Use   Smoking status: Every Day    Packs/day: 1.00    Years: 50.00    Total pack years: 50.00    Types: Cigarettes   Smokeless tobacco: Never   Tobacco comments:    Reports 5-10 cigs/day  Vaping Use   Vaping Use: Never used  Substance and Sexual Activity   Alcohol use: Yes    Comment: 2+ drinks per night, vodka or wine   Drug use: No   Sexual activity: Not on file  Other Topics Concern   Not on file  Social History Narrative   Work or School: 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      Home Situation: live with husband - boys off at college      Spiritual Beliefs: catholic      Lifestyle: lots of exercise - tennis; diet is healthy      Write with her left hand and does everything else with her right hand       Lives in a two story home    Social Determinants of Health   Financial Resource Strain: Low Risk  (10/09/2021)   Overall Financial Resource Strain (CARDIA)    Difficulty of Paying  Living Expenses: Not hard at all  Food Insecurity: No Food Insecurity (10/09/2021)   Hunger Vital Sign    Worried About Running Out of Food in the Last Year: Never true    Ran Out of Food in the Last Year: Never true  Transportation Needs: No Transportation Needs (10/09/2021)   PRAPARE - Hydrologist (Medical): No    Lack of Transportation (Non-Medical): No  Physical Activity: Sufficiently Active (10/09/2021)   Exercise Vital Sign  Days of Exercise per Week: 4 days    Minutes of Exercise per Session: 120 min  Stress: No Stress Concern Present (10/09/2021)   [place]    Feeling of Stress : Not at all  Social Connections: Moderately Isolated (05/20/2020)   Social Connection and Isolation Panel [NHANES]    Frequency of Communication with Friends and Family: More than three times a week    Frequency of Social Gatherings with Friends and Family: Three times a week    Attends Religious Services: Never    Active Member of Clubs or Organizations: No    Attends Archivist Meetings: Never    Marital Status: Married    Tobacco Counseling Ready to quit: Yes Counseling given: Not Answered Tobacco comments: Reports 5-10 cigs/day   Clinical Intake:  Pre-visit preparation completed: Yes  Pain : 0-10 Pain Score: 3  Pain Type: Acute pain Pain Location: Back Pain Orientation: Lower Pain Onset: In the past 7 days Pain Frequency: Intermittent     Nutritional Status: BMI 25 -29 Overweight Nutritional Risks: None Diabetes: No  How often do you need to have someone help you when you read instructions, pamphlets, or other written materials from your doctor or pharmacy?: 1 - Never What is the last grade level you completed in school?: college  Diabetic? no  Interpreter Needed?: No  Information entered by :: NAllen LPN   Activities of Daily Living    10/09/2021   10:50 AM  In  your present state of health, do you have any difficulty performing the following activities:  Hearing? 0  Vision? 0  Difficulty concentrating or making decisions? 0  Walking or climbing stairs? 0  Dressing or bathing? 0  Doing errands, shopping? 0  Preparing Food and eating ? N  Using the Toilet? N  In the past six months, have you accidently leaked urine? Y  Comment occasionally  Do you have problems with loss of bowel control? N  Managing your Medications? N  Managing your Finances? N  Housekeeping or managing your Housekeeping? N    Patient Care Team: Eulas Post, MD as PCP - General (Family Medicine) Buford Dresser, MD as PCP - Cardiology (Cardiology) Richmond Campbell, MD as Consulting Physician (Gastroenterology) Alda Berthold, DO as Consulting Physician (Neurology) Dimitri Ped, RN as [place] any recent [place] you may have received from other than [facility] providers in the past year (date may be approximate).     Assessment:   This is a routine wellness examination for [place].  Hearing/Vision screen Vision Screening - Comments:: Regular eye exams, [facility]  Dietary issues and exercise activities discussed: Current Exercise Habits: Home exercise routine, Type of exercise: Other - see comments (tennis and elliptical), Time (Minutes): > 60, Frequency (Times/Week): 4, Weekly Exercise (Minutes/Week): 0   Goals Addressed             This Visit's Progress    Patient Stated       10/09/2021, cut down smoking and lose weight       Depression Screen    10/09/2021   10:50 AM 06/30/2021   11:35 AM 05/05/2021   10:42 AM 04/12/2021   10:47 AM 05/20/2020    9:21 AM 05/18/2019   10:35 AM 05/18/2019   10:29 AM  PHQ 2/9 Scores  PHQ - 2 Score 0 0 0 0 0 0 0  PHQ- 9 Score  0    Fall Risk    10/09/2021   10:50 AM 06/30/2021   11:34 AM 04/12/2021   10:47 AM 12/26/2020    1:33 PM 09/08/2020   10:53 AM   Fall Risk   Falls in the past year? 0 0 0 0 0  Number falls in past yr: 0 0  0 0  Injury with Fall? 0 0  0 0  Risk for fall due to : Medication side effect No Fall Risks  No Fall Risks   Follow up Falls evaluation completed;Education provided;Falls prevention discussed Education provided;Falls prevention discussed  Falls evaluation completed     FALL RISK PREVENTION PERTAINING TO THE HOME:  Any stairs in or around the home? Yes  If so, are there any without handrails? No  Home free of loose throw rugs in walkways, pet beds, electrical cords, etc? Yes  Adequate lighting in your home to reduce risk of falls? Yes   ASSISTIVE DEVICES UTILIZED TO PREVENT FALLS:  Life alert? No  Use of a cane, walker or w/c? No  Grab bars in the bathroom? Yes  Shower chair or bench in shower? No  Elevated toilet seat or a handicapped toilet? Yes   TIMED UP AND GO:  Was the test performed? No .       Cognitive Function:        10/09/2021   10:51 AM  6CIT Screen  What Year? 0 points  What month? 0 points  What time? 3 points  Count back from 20 0 points  Months in reverse 2 points  Repeat phrase 4 points  Total Score 9 points    Immunizations Immunization History  Administered Date(s) Administered   Fluad Quad(high Dose 65+) 12/02/2018, 12/26/2020   Hepatitis A, Adult 07/25/2021   Influenza Split 11/27/2011   Influenza Whole 02/15/2009   Influenza,inj,Quad PF,6+ Mos 12/04/2012, 11/30/2014, 01/13/2018   Influenza-Unspecified 12/19/2016, 12/10/2019   PFIZER Comirnaty(Gray Top)Covid-19 Tri-Sucrose Vaccine 08/22/2020   PFIZER(Purple Top)SARS-COV-2 Vaccination 04/03/2019, 04/22/2019, 12/10/2019   Pneumococcal Conjugate-13 12/19/2018   Pneumococcal Polysaccharide-23 12/02/2018   Td 06/11/1998   Tdap 12/04/2012   Zoster, Live 08/16/2014    TDAP status: Up to date  Flu Vaccine status: Up to date  Pneumococcal vaccine status: Up to date  Covid-19 vaccine status: Completed  vaccines  Qualifies for Shingles Vaccine? Yes   Zostavax completed Yes   Shingrix Completed?: No.    Education has been provided regarding the importance of this vaccine. Patient has been advised to call insurance company to determine out of pocket expense if they have not yet received this vaccine. Advised may also receive vaccine at local pharmacy or Health Dept. Verbalized acceptance and understanding.  Screening Tests Health Maintenance  Topic Date Due   Zoster Vaccines- Shingrix (1 of 2) Never done   DEXA SCAN  Never done   COVID-19 Vaccine (5 - Pfizer series) 10/17/2020   INFLUENZA VACCINE  10/10/2021   MAMMOGRAM  01/04/2022   TETANUS/TDAP  12/05/2022   Fecal DNA (Cologuard)  03/26/2023   Pneumonia Vaccine 51+ Years old  Completed   Hepatitis C Screening  Completed   HPV VACCINES  Aged Out   COLONOSCOPY (Pts 45-85yr Insurance coverage will need to be confirmed)  Discontinued    Health Maintenance  Health Maintenance Due  Topic Date Due   Zoster Vaccines- Shingrix (1 of 2) Never done   DEXA SCAN  Never done   COVID-19 Vaccine (5 - Pfizer series) 10/17/2020    Colorectal  cancer screening: Type of screening: Cologuard. Completed 03/25/2020. Repeat every 3 years  Mammogram status: patient to schedule  Bone Density status: due  Lung Cancer Screening: (Low Dose CT Chest recommended if Age 46-80 years, 30 pack-year currently smoking OR have quit w/in 15years.) does qualify.   Lung Cancer Screening Referral: patient to reschedule  Additional Screening:  Hepatitis C Screening: does qualify; Completed 05/18/2019  Vision Screening: Recommended annual ophthalmology exams for early detection of glaucoma and other disorders of the eye. Is the patient up to date with their annual eye exam?  Yes  Who is the provider or what is the name of the office in which the patient attends annual eye exams? [facility] If pt is not established with a provider, would they like to be referred  to a provider to establish care? No .   Dental Screening: Recommended annual dental exams for proper oral hygiene  Community Resource Referral / Chronic Care Management: CRR required this visit?  No   CCM required this visit?  No      Plan:     I have personally reviewed and noted the following in the patient's chart:   Medical and social history Use of alcohol, tobacco or illicit drugs  Current medications and supplements including opioid prescriptions.  Functional ability and status Nutritional status Physical activity Advanced directives List of other physicians Hospitalizations, surgeries, and ER visits in previous 12 months Vitals Screenings to include cognitive, depression, and falls Referrals and appointments  In addition, I have reviewed and discussed with patient certain preventive protocols, quality metrics, and best practice recommendations. A written personalized care plan for preventive services as well as general preventive health recommendations were provided to patient.     Kellie Simmering, LPN   1/61/0960   Nurse Notes: none  Due to this being a virtual visit, the after visit summary with patients personalized plan was offered to patient via mail or my-chart.  Patient would like to access on my-chart

## 2021-10-09 NOTE — Patient Instructions (Signed)
Debra Trevino , Thank you for taking time to come for your Medicare Wellness Visit. I appreciate your ongoing commitment to your health goals. Please review the following plan we discussed and let me know if I can assist you in the future.   Screening recommendations/referrals: Colonoscopy: cologuard 03/25/2020, due 03/26/2023 Mammogram: patient to schedule Bone Density: due Recommended yearly ophthalmology/optometry visit for glaucoma screening and checkup Recommended yearly dental visit for hygiene and checkup  Vaccinations: Influenza vaccine: due 10/10/2021 Pneumococcal vaccine: completed 12/19/2018 Tdap vaccine: completed 12/04/2012, due 12/05/2022 Shingles vaccine: discussed   Covid-19: 08/22/2020, 12/10/2019, 04/22/2019, 04/03/2019  Advanced directives: Please bring a copy of your POA (Power of Attorney) and/or Living Will to your next appointment.   Conditions/risks identified: smoking  Next appointment: Follow up in one year for your annual wellness visit    Preventive Care 41 Years and Older, Female Preventive care refers to lifestyle choices and visits with your health care provider that can promote health and wellness. What does preventive care include? A yearly physical exam. This is also called an annual well check. Dental exams once or twice a year. Routine eye exams. Ask your health care provider how often you should have your eyes checked. Personal lifestyle choices, including: Daily care of your teeth and gums. Regular physical activity. Eating a healthy diet. Avoiding tobacco and drug use. Limiting alcohol use. Practicing safe sex. Taking low-dose aspirin every day. Taking vitamin and mineral supplements as recommended by your health care provider. What happens during an annual well check? The services and screenings done by your health care provider during your annual well check will depend on your age, overall health, lifestyle risk factors, and family history of  disease. Counseling  Your health care provider may ask you questions about your: Alcohol use. Tobacco use. Drug use. Emotional well-being. Home and relationship well-being. Sexual activity. Eating habits. History of falls. Memory and ability to understand (cognition). Work and work Statistician. Reproductive health. Screening  You may have the following tests or measurements: Height, weight, and BMI. Blood pressure. Lipid and cholesterol levels. These may be checked every 5 years, or more frequently if you are over 50 years old. Skin check. Lung cancer screening. You may have this screening every year starting at age 69 if you have a 30-pack-year history of smoking and currently smoke or have quit within the past 15 years. Fecal occult blood test (FOBT) of the stool. You may have this test every year starting at age 69. Flexible sigmoidoscopy or colonoscopy. You may have a sigmoidoscopy every 5 years or a colonoscopy every 10 years starting at age 31. Hepatitis C blood test. Hepatitis B blood test. Sexually transmitted disease (STD) testing. Diabetes screening. This is done by checking your blood sugar (glucose) after you have not eaten for a while (fasting). You may have this done every 1-3 years. Bone density scan. This is done to screen for osteoporosis. You may have this done starting at age 38. Mammogram. This may be done every 1-2 years. Talk to your health care provider about how often you should have regular mammograms. Talk with your health care provider about your test results, treatment options, and if necessary, the need for more tests. Vaccines  Your health care provider may recommend certain vaccines, such as: Influenza vaccine. This is recommended every year. Tetanus, diphtheria, and acellular pertussis (Tdap, Td) vaccine. You may need a Td booster every 10 years. Zoster vaccine. You may need this after age 62. Pneumococcal 13-valent conjugate (PCV13)  vaccine. One  dose is recommended after age 34. Pneumococcal polysaccharide (PPSV23) vaccine. One dose is recommended after age 43. Talk to your health care provider about which screenings and vaccines you need and how often you need them. This information is not intended to replace advice given to you by your health care provider. Make sure you discuss any questions you have with your health care provider. Document Released: 03/25/2015 Document Revised: 11/16/2015 Document Reviewed: 12/28/2014 Elsevier Interactive Patient Education  2017 [place] Prevention in the Home Falls can cause injuries. They can happen to people of all ages. There are many things you can do to make your home safe and to help prevent falls. What can I do on the outside of my home? Regularly fix the edges of walkways and driveways and fix any cracks. Remove anything that might make you trip as you walk through a door, such as a raised step or threshold. Trim any bushes or trees on the path to your home. Use bright outdoor lighting. Clear any walking paths of anything that might make someone trip, such as rocks or tools. Regularly check to see if handrails are loose or broken. Make sure that both sides of any steps have handrails. Any raised decks and porches should have guardrails on the edges. Have any leaves, snow, or ice cleared regularly. Use sand or salt on walking paths during winter. Clean up any spills in your garage right away. This includes oil or grease spills. What can I do in the bathroom? Use night lights. Install grab bars by the toilet and in the tub and shower. Do not use towel bars as grab bars. Use non-skid mats or decals in the tub or shower. If you need to sit down in the shower, use a plastic, non-slip stool. Keep the floor dry. Clean up any water that spills on the floor as soon as it happens. Remove soap buildup in the tub or shower regularly. Attach bath mats securely with double-sided  non-slip rug tape. Do not have throw rugs and other things on the floor that can make you trip. What can I do in the bedroom? Use night lights. Make sure that you have a light by your bed that is easy to reach. Do not use any sheets or blankets that are too big for your bed. They should not hang down onto the floor. Have a firm chair that has side arms. You can use this for support while you get dressed. Do not have throw rugs and other things on the floor that can make you trip. What can I do in the kitchen? Clean up any spills right away. Avoid walking on wet floors. Keep items that you use a lot in easy-to-reach places. If you need to reach something above you, use a strong step stool that has a grab bar. Keep electrical cords out of the way. Do not use floor polish or wax that makes floors slippery. If you must use wax, use non-skid floor wax. Do not have throw rugs and other things on the floor that can make you trip. What can I do with my stairs? Do not leave any items on the stairs. Make sure that there are handrails on both sides of the stairs and use them. Fix handrails that are broken or loose. Make sure that handrails are as long as the stairways. Check any carpeting to make sure that it is firmly attached to the stairs. Fix any carpet that is loose  or worn. Avoid having throw rugs at the top or bottom of the stairs. If you do have throw rugs, attach them to the floor with carpet tape. Make sure that you have a light switch at the top of the stairs and the bottom of the stairs. If you do not have them, ask someone to add them for you. What else can I do to help prevent falls? Wear shoes that: Do not have high heels. Have rubber bottoms. Are comfortable and fit you well. Are closed at the toe. Do not wear sandals. If you use a stepladder: Make sure that it is fully opened. Do not climb a closed stepladder. Make sure that both sides of the stepladder are locked into place. Ask  someone to hold it for you, if possible. Clearly mark and make sure that you can see: Any grab bars or handrails. First and last steps. Where the edge of each step is. Use tools that help you move around (mobility aids) if they are needed. These include: Canes. Walkers. Scooters. Crutches. Turn on the lights when you go into a dark area. Replace any light bulbs as soon as they burn out. Set up your furniture so you have a clear path. Avoid moving your furniture around. If any of your floors are uneven, fix them. If there are any pets around you, be aware of where they are. Review your medicines with your doctor. Some medicines can make you feel dizzy. This can increase your chance of falling. Ask your doctor what other things that you can do to help prevent falls. This information is not intended to replace advice given to you by your health care provider. Make sure you discuss any questions you have with your health care provider. Document Released: 12/23/2008 Document Revised: 08/04/2015 Document Reviewed: 04/02/2014 Elsevier Interactive Patient Education  2017 [employer].

## 2021-10-09 NOTE — Telephone Encounter (Signed)
Rx sent 

## 2021-10-13 DIAGNOSIS — M5451 Vertebrogenic low back pain: Secondary | ICD-10-CM | POA: Diagnosis not present

## 2021-10-17 DIAGNOSIS — M5451 Vertebrogenic low back pain: Secondary | ICD-10-CM | POA: Diagnosis not present

## 2021-10-19 DIAGNOSIS — M5451 Vertebrogenic low back pain: Secondary | ICD-10-CM | POA: Diagnosis not present

## 2021-10-25 ENCOUNTER — Other Ambulatory Visit: Payer: Self-pay | Admitting: Family Medicine

## 2021-10-26 DIAGNOSIS — M5451 Vertebrogenic low back pain: Secondary | ICD-10-CM | POA: Diagnosis not present

## 2021-10-30 DIAGNOSIS — M5451 Vertebrogenic low back pain: Secondary | ICD-10-CM | POA: Diagnosis not present

## 2021-10-31 ENCOUNTER — Ambulatory Visit
Admission: RE | Admit: 2021-10-31 | Discharge: 2021-10-31 | Disposition: A | Discharge status: Home / Self Care | Payer: Medicare Other | Source: Ambulatory Visit | Attending: Acute Care | Admitting: Acute Care

## 2021-10-31 DIAGNOSIS — F17210 Nicotine dependence, cigarettes, uncomplicated: Secondary | ICD-10-CM

## 2021-10-31 DIAGNOSIS — Z122 Encounter for screening for malignant neoplasm of respiratory organs: Secondary | ICD-10-CM

## 2021-11-03 ENCOUNTER — Other Ambulatory Visit: Payer: Self-pay

## 2021-11-03 DIAGNOSIS — Z122 Encounter for screening for malignant neoplasm of respiratory organs: Secondary | ICD-10-CM

## 2021-11-03 DIAGNOSIS — Z87891 Personal history of nicotine dependence: Secondary | ICD-10-CM

## 2021-11-03 DIAGNOSIS — M5451 Vertebrogenic low back pain: Secondary | ICD-10-CM | POA: Diagnosis not present

## 2021-11-03 DIAGNOSIS — F17210 Nicotine dependence, cigarettes, uncomplicated: Secondary | ICD-10-CM

## 2021-11-09 DIAGNOSIS — M5451 Vertebrogenic low back pain: Secondary | ICD-10-CM | POA: Diagnosis not present

## 2021-11-22 DIAGNOSIS — M5459 Other low back pain: Secondary | ICD-10-CM | POA: Diagnosis not present

## 2021-11-22 DIAGNOSIS — M5136 Other intervertebral disc degeneration, lumbar region: Secondary | ICD-10-CM | POA: Diagnosis not present

## 2021-11-22 DIAGNOSIS — M5451 Vertebrogenic low back pain: Secondary | ICD-10-CM | POA: Diagnosis not present

## 2021-11-30 ENCOUNTER — Other Ambulatory Visit: Payer: Self-pay | Admitting: Family Medicine

## 2021-12-04 DIAGNOSIS — M5459 Other low back pain: Secondary | ICD-10-CM | POA: Diagnosis not present

## 2021-12-06 DIAGNOSIS — M5451 Vertebrogenic low back pain: Secondary | ICD-10-CM | POA: Diagnosis not present

## 2021-12-06 DIAGNOSIS — M48061 Spinal stenosis, lumbar region without neurogenic claudication: Secondary | ICD-10-CM | POA: Diagnosis not present

## 2021-12-06 DIAGNOSIS — M5126 Other intervertebral disc displacement, lumbar region: Secondary | ICD-10-CM | POA: Diagnosis not present

## 2021-12-12 DIAGNOSIS — Z23 Encounter for immunization: Secondary | ICD-10-CM | POA: Diagnosis not present

## 2021-12-22 DIAGNOSIS — M5451 Vertebrogenic low back pain: Secondary | ICD-10-CM | POA: Diagnosis not present

## 2021-12-26 DIAGNOSIS — M5126 Other intervertebral disc displacement, lumbar region: Secondary | ICD-10-CM | POA: Diagnosis not present

## 2021-12-27 ENCOUNTER — Other Ambulatory Visit: Payer: Self-pay | Admitting: Family Medicine

## 2022-01-01 ENCOUNTER — Other Ambulatory Visit: Payer: Self-pay | Admitting: Family Medicine

## 2022-01-08 ENCOUNTER — Other Ambulatory Visit: Payer: Self-pay | Admitting: Family Medicine

## 2022-01-11 DIAGNOSIS — M48061 Spinal stenosis, lumbar region without neurogenic claudication: Secondary | ICD-10-CM | POA: Diagnosis not present

## 2022-01-11 DIAGNOSIS — M4310 Spondylolisthesis, site unspecified: Secondary | ICD-10-CM | POA: Diagnosis not present

## 2022-01-11 DIAGNOSIS — M5126 Other intervertebral disc displacement, lumbar region: Secondary | ICD-10-CM | POA: Diagnosis not present

## 2022-01-26 DIAGNOSIS — Z23 Encounter for immunization: Secondary | ICD-10-CM | POA: Diagnosis not present

## 2022-01-31 ENCOUNTER — Other Ambulatory Visit: Payer: Self-pay | Admitting: Family Medicine

## 2022-02-01 ENCOUNTER — Other Ambulatory Visit: Payer: Self-pay | Admitting: Family Medicine

## 2022-02-06 ENCOUNTER — Ambulatory Visit (INDEPENDENT_AMBULATORY_CARE_PROVIDER_SITE_OTHER): Payer: Medicare Other | Admitting: Family Medicine

## 2022-02-06 ENCOUNTER — Encounter: Payer: Self-pay | Admitting: Family Medicine

## 2022-02-06 VITALS — BP 106/60 | HR 62 | Temp 97.5°F | Ht 62.0 in | Wt 155.1 lb

## 2022-02-06 DIAGNOSIS — R5383 Other fatigue: Secondary | ICD-10-CM

## 2022-02-06 DIAGNOSIS — K529 Noninfective gastroenteritis and colitis, unspecified: Secondary | ICD-10-CM | POA: Diagnosis not present

## 2022-02-06 NOTE — Progress Notes (Signed)
Established Patient Office Visit  Subjective   Patient ID: Debra Trevino, female    DOB: 03-Nov-1955  Age: 66 y.o. MRN: [id number]  Chief Complaint  Patient presents with   Diarrhea    Patient complains of diarrhea, x2 months    HPI   Debra Trevino is seen with at least 36-monthhistory of diarrhea symptoms.  Does not have any prior history of significant diarrhea.  [place]  She states that she is having liquidy nonbloody stools about 4 to 5 days/week usually 2 to 3/day.  No fever or chills.  No major appetite changes.  No weight loss.  No abdominal pain.  Does occasionally takes CBD Gummies but otherwise no over-the-counter supplements.  He is on chronic PPI with Nexium 20 mg daily but has been on this for some time.  No history of lactose intolerance.  No history of pancreatitis.  [facility]  She had Cologuard last year which was negative.  No recent antibiotics.  No known family history of inflammatory bowel disease  Past Medical History:  Diagnosis Date   Bilateral foot pain    morton neuroma, seeing podiatrist   CTS (carpal tunnel syndrome)    has seen hand specilist   Depression 03/24/2007   Qualifier: Diagnosis of  By: JArnoldo MoraleMD, JBalinda Quails   Gastric ulcer    per patient   GERD (gastroesophageal reflux disease) 05/27/2014   Sees Dr. MEarlean Shawl hx erosive esophagitis   Hot flashes    recur when stops HRT   HTN (hypertension)    Low back pain    chronic   Migraines    resolved on HRT for hot flases   Muscle spasm    very active, muscle spasm after tennis matches   Past Surgical History:  Procedure Laterality Date   CARPAL TUNNEL RELEASE     right hand   MEDIAL PARTIAL KNEE REPLACEMENT  2017   Right knee   REVISION TOTAL KNEE ARTHROPLASTY Left    02/2021   TONSILLECTOMY      reports that she has been smoking cigarettes.  She has a 50.00 pack-year smoking history. She has never used smokeless tobacco. She reports current alcohol use. She reports that she does not use drugs. family history includes Charcot-Marie-Tooth disease in her father; Hypertension in an other family member; Renal cancer in her mother; Schizophrenia in her brother. Allergies  Allergen Reactions   Penicillins Swelling    Has patient had a PCN reaction causing immediate rash, facial/tongue/throat swelling, SOB or lightheadedness with hypotension:YES Has patient had a PCN reaction causing severe rash involving mucus membranes or skin necrosis: NO Has patient had a PCN reaction that required hospitalization NO Has patient had a PCN reaction occurring within the last 10 years: NO If all of the above answers are "NO", then may proceed with Cephalosporin use.   Tape Rash    Pt is not sure if paper tape also causes the rash. Reaction was to surgical tape    Review of Systems  Constitutional:  Negative for chills, fever and weight loss.  Gastrointestinal:  Positive for diarrhea. Negative for abdominal pain, blood in stool, nausea and vomiting.  Genitourinary:  Negative for dysuria.  Neurological:  Negative for dizziness.      Objective:     BP 106/60 (BP Location: Left Arm, Patient  Position: Sitting, Cuff Size: Normal)   Pulse 62   Temp (!) 97.5 F (36.4 C) (Oral)   Ht '5\' 2"'$  (1.575 m)   Wt 155 lb 1.6 oz (70.4 kg)   SpO2 100%   BMI 28.37 kg/m  BP Readings from Last 3 Encounters:  02/06/22 106/60  07/25/21 118/70  06/01/21 120/84   Wt Readings from Last 3 Encounters:  02/06/22 155 lb 1.6 oz (70.4 kg)  10/09/21 150 lb (68 kg)  07/25/21 152 lb (68.9 kg)      Physical Exam Vitals reviewed.  Constitutional:      Appearance: Normal appearance.  Cardiovascular:     Rate and Rhythm: Normal rate and regular rhythm.  Pulmonary:     Effort: Pulmonary effort is normal.     Breath sounds: Normal breath sounds.  Abdominal:      General: There is no distension.     Palpations: Abdomen is soft.     Tenderness: There is no abdominal tenderness. There is no guarding or rebound.  Neurological:     Mental Status: She is alert.      No results found for any visits on 02/06/22.    The ASCVD Risk score (Arnett DK, et al., 2019) failed to calculate for the following reasons:   Cannot find a previous HDL lab   Cannot find a previous total cholesterol lab    Assessment & Plan:   Problem List Items Addressed This Visit   None Visit Diagnoses     Chronic diarrhea    -  Primary   Relevant Orders   CBC with Differential/Platelet   CMP   C. difficile, PCR([facility])   Pancreatic Elastase, Fecal   Gastrointestinal Panel by PCR , Stool   Tissue transglutaminase, IgA   IgA   TSH   Fatigue, unspecified type       Relevant Orders   TSH     Patient relates at least 48-monthhistory of almost daily diarrhea symptoms.  No prior history of chronic diarrhea.  She does not have any red flags such as recent antibiotic, bloody stools, fever, weight loss, etc.  -Start with labs above -If above unrevealing set up GI referral -Avoid high fat and high glycemic foods as much as possible  No follow-ups on file.    BCarolann Littler MD

## 2022-02-08 ENCOUNTER — Other Ambulatory Visit: Payer: Medicare Other

## 2022-02-08 DIAGNOSIS — M5126 Other intervertebral disc displacement, lumbar region: Secondary | ICD-10-CM | POA: Diagnosis not present

## 2022-02-16 ENCOUNTER — Other Ambulatory Visit: Payer: Medicare Other

## 2022-02-16 DIAGNOSIS — R5383 Other fatigue: Secondary | ICD-10-CM | POA: Diagnosis not present

## 2022-02-16 DIAGNOSIS — K529 Noninfective gastroenteritis and colitis, unspecified: Secondary | ICD-10-CM | POA: Diagnosis not present

## 2022-02-16 NOTE — Addendum Note (Signed)
Addended by: Rosalyn Gess D on: 02/16/2022 03:35 PM   Modules accepted: Orders

## 2022-02-17 LAB — COMPREHENSIVE METABOLIC PANEL
AG Ratio: 1.8 (calc) (ref 1.0–2.5)
ALT: 18 U/L (ref 6–29)
AST: 15 U/L (ref 10–35)
Albumin: 4.2 g/dL (ref 3.6–5.1)
Alkaline phosphatase (APISO): 81 U/L (ref 37–153)
BUN/Creatinine Ratio: 33 (calc) — ABNORMAL HIGH (ref 6–22)
BUN: 29 mg/dL — ABNORMAL HIGH (ref 7–25)
CO2: 26 mmol/L (ref 20–32)
Calcium: 9.5 mg/dL (ref 8.6–10.4)
Chloride: 106 mmol/L (ref 98–110)
Creat: 0.88 mg/dL (ref 0.50–1.05)
Globulin: 2.4 g/dL (calc) (ref 1.9–3.7)
Glucose, Bld: 94 mg/dL (ref 65–99)
Potassium: 4.2 mmol/L (ref 3.5–5.3)
Sodium: 140 mmol/L (ref 135–146)
Total Bilirubin: 0.3 mg/dL (ref 0.2–1.2)
Total Protein: 6.6 g/dL (ref 6.1–8.1)

## 2022-02-17 LAB — CBC WITH DIFFERENTIAL/PLATELET
Absolute Monocytes: 635 cells/uL (ref 200–950)
Basophils Absolute: 48 cells/uL (ref 0–200)
Basophils Relative: 0.7 %
Eosinophils Absolute: 248 cells/uL (ref 15–500)
Eosinophils Relative: 3.6 %
HCT: 41.2 % (ref 35.0–45.0)
Hemoglobin: 14.4 g/dL (ref 11.7–15.5)
Lymphs Abs: 2505 cells/uL (ref 850–3900)
MCH: 32.6 pg (ref 27.0–33.0)
MCHC: 35 g/dL (ref 32.0–36.0)
MCV: 93.2 fL (ref 80.0–100.0)
MPV: 10.2 fL (ref 7.5–12.5)
Monocytes Relative: 9.2 %
Neutro Abs: 3464 cells/uL (ref 1500–7800)
Neutrophils Relative %: 50.2 %
Platelets: 245 10*3/uL (ref 140–400)
RBC: 4.42 10*6/uL (ref 3.80–5.10)
RDW: 12.3 % (ref 11.0–15.0)
Total Lymphocyte: 36.3 %
WBC: 6.9 10*3/uL (ref 3.8–10.8)

## 2022-02-17 LAB — IGA: Immunoglobulin A: 242 mg/dL (ref 70–320)

## 2022-02-17 LAB — TISSUE TRANSGLUTAMINASE, IGA: (tTG) Ab, IgA: <1 U/mL

## 2022-02-17 LAB — TSH: TSH: 1.07 mIU/L (ref 0.40–4.50)

## 2022-02-21 ENCOUNTER — Other Ambulatory Visit: Payer: Medicare Other

## 2022-02-21 DIAGNOSIS — K529 Noninfective gastroenteritis and colitis, unspecified: Secondary | ICD-10-CM

## 2022-02-22 ENCOUNTER — Other Ambulatory Visit: Payer: Self-pay | Admitting: Family Medicine

## 2022-02-22 DIAGNOSIS — M5126 Other intervertebral disc displacement, lumbar region: Secondary | ICD-10-CM | POA: Diagnosis not present

## 2022-02-23 ENCOUNTER — Encounter: Payer: Self-pay | Admitting: Family Medicine

## 2022-02-23 LAB — CLOSTRIDIUM DIFFICILE BY PCR: Toxigenic C. Difficile by PCR: NEGATIVE

## 2022-02-25 LAB — GI PROFILE, STOOL, PCR

## 2022-02-28 ENCOUNTER — Other Ambulatory Visit: Payer: Self-pay | Admitting: Family Medicine

## 2022-02-28 LAB — PANCREATIC ELASTASE, FECAL: Pancreatic Elastase-1, Stool: >500 mcg/g

## 2022-02-28 NOTE — Telephone Encounter (Signed)
February 28, 2022 Eulas Post, MD  to Carolann Littler Rx Refill     02/28/22  1:11 PM Refill for 6 months

## 2022-02-28 NOTE — Telephone Encounter (Signed)
Last OV 02/06/22 for acute reasons

## 2022-03-17 ENCOUNTER — Other Ambulatory Visit: Payer: Self-pay | Admitting: Family Medicine

## 2022-03-19 ENCOUNTER — Other Ambulatory Visit: Payer: Self-pay | Admitting: Family Medicine

## 2022-03-29 ENCOUNTER — Encounter: Payer: Self-pay | Admitting: Family Medicine

## 2022-04-06 ENCOUNTER — Other Ambulatory Visit: Payer: Self-pay | Admitting: Family Medicine

## 2022-04-17 DIAGNOSIS — C44329 Squamous cell carcinoma of skin of other parts of face: Secondary | ICD-10-CM | POA: Diagnosis not present

## 2022-04-17 DIAGNOSIS — Z85828 Personal history of other malignant neoplasm of skin: Secondary | ICD-10-CM | POA: Diagnosis not present

## 2022-04-17 DIAGNOSIS — D485 Neoplasm of uncertain behavior of skin: Secondary | ICD-10-CM | POA: Diagnosis not present

## 2022-04-27 ENCOUNTER — Other Ambulatory Visit: Payer: Self-pay | Admitting: Family Medicine

## 2022-05-14 ENCOUNTER — Encounter: Payer: Self-pay | Admitting: Family Medicine

## 2022-05-14 DIAGNOSIS — Z780 Asymptomatic menopausal state: Secondary | ICD-10-CM

## 2022-05-15 DIAGNOSIS — Z780 Asymptomatic menopausal state: Secondary | ICD-10-CM | POA: Diagnosis not present

## 2022-05-15 DIAGNOSIS — M8589 Other specified disorders of bone density and structure, multiple sites: Secondary | ICD-10-CM | POA: Diagnosis not present

## 2022-05-15 DIAGNOSIS — M810 Age-related osteoporosis without current pathological fracture: Secondary | ICD-10-CM | POA: Diagnosis not present

## 2022-05-15 DIAGNOSIS — Z1231 Encounter for screening mammogram for malignant neoplasm of breast: Secondary | ICD-10-CM | POA: Diagnosis not present

## 2022-05-16 ENCOUNTER — Encounter: Payer: Self-pay | Admitting: Family Medicine

## 2022-05-16 LAB — HM MAMMOGRAPHY

## 2022-05-16 LAB — HM DEXA SCAN

## 2022-05-17 ENCOUNTER — Encounter: Payer: Self-pay | Admitting: Family Medicine

## 2022-05-18 ENCOUNTER — Other Ambulatory Visit: Payer: Self-pay | Admitting: Family Medicine

## 2022-05-21 NOTE — Telephone Encounter (Signed)
Rx done. 

## 2022-05-22 ENCOUNTER — Encounter: Payer: Self-pay | Admitting: Family Medicine

## 2022-05-22 ENCOUNTER — Ambulatory Visit (INDEPENDENT_AMBULATORY_CARE_PROVIDER_SITE_OTHER): Payer: Medicare Other | Admitting: Family Medicine

## 2022-05-22 VITALS — BP 112/66 | HR 63 | Ht 62.0 in | Wt 154.3 lb

## 2022-05-22 DIAGNOSIS — M810 Age-related osteoporosis without current pathological fracture: Secondary | ICD-10-CM | POA: Diagnosis not present

## 2022-05-22 DIAGNOSIS — G629 Polyneuropathy, unspecified: Secondary | ICD-10-CM

## 2022-05-22 DIAGNOSIS — R739 Hyperglycemia, unspecified: Secondary | ICD-10-CM

## 2022-05-22 NOTE — Progress Notes (Signed)
Established Patient Office Visit  Subjective   Patient ID: Debra Trevino, female    DOB: June 13, 1955  Age: 67 y.o. MRN: [id number]  No chief complaint on file.   HPI   Debra Trevino has history of hypertension, GERD with past history of esophageal stricture, osteoarthritis, and ongoing nicotine use.  She is seen today to discuss results of recent DEXA scan.  This showed T-score -2.0 right femur neck -2.6 left femur neck.  Denies any history of fractures.  Her mother apparently had osteoporosis and she has additional risk factors of smoking and daily alcohol use 2-3+ drinks per day.  She has never been on osteoporosis therapies previously.  She does have history of GERD with esophageal stricture and takes omeprazole 20 mg daily.  Other medications include gabapentin 100 mg nightly and Celexa 20 mg daily along with Ziac 2.5/6.25 mg 1 daily  Other issue is neuropathy symptoms involving both feet and hands.  She has tingling sensation and some very mild numbness.  Occasional burning quality.  She saw PA with orthopedics couple years ago and was prescribed low-dose gabapentin.  She apparently takes over-the-counter B12.  No known history of diabetes but has had some mild hyperglycemia in the past.  No recent A1c.  Recent TSH normal.  Does drink alcohol daily as above usually combination of vodka and wine sometimes over 3 drinks per day.  No difficulties with balance.  No recent appetite or weight changes.  Past Medical History:  Diagnosis Date   Bilateral foot pain    morton neuroma, seeing podiatrist   CTS (carpal tunnel syndrome)    has seen hand specilist   Depression 03/24/2007   Qualifier: Diagnosis of  By: Arnoldo Morale MD, Balinda Quails    Gastric ulcer    per patient   GERD (gastroesophageal reflux disease) 05/27/2014   Sees Dr. Earlean Shawl; hx erosive esophagitis   Hot flashes    recur when stops HRT   HTN (hypertension)    Low back pain    chronic   Migraines    resolved on HRT for hot flases    Muscle spasm    very active, muscle spasm after tennis matches   Past Surgical History:  Procedure Laterality Date   CARPAL TUNNEL RELEASE     right hand   MEDIAL PARTIAL KNEE REPLACEMENT  2017   Right knee   REVISION TOTAL KNEE ARTHROPLASTY Left    02/2021   TONSILLECTOMY      reports that she has been smoking cigarettes. She has a 50.00 pack-year smoking history. She has never used smokeless tobacco. She reports current alcohol use. She reports that she does not use drugs. family history includes Charcot-Marie-Tooth disease in her father; Hypertension in an other family member; Renal cancer in her mother; Schizophrenia in her brother. Allergies  Allergen Reactions   Penicillins Swelling    Has patient had a PCN reaction causing immediate rash, facial/tongue/throat swelling, SOB or lightheadedness with hypotension:YES Has patient had a PCN reaction causing severe rash involving mucus membranes or skin necrosis: NO Has patient had a PCN reaction that required hospitalization NO Has patient had a PCN reaction occurring within the last 10 years: NO If all of the above answers are "NO", then may proceed with Cephalosporin use.   Tape Rash    Pt is not sure if paper tape also causes the rash. Reaction was to surgical tape      Review of Systems  Constitutional:  Negative for chills, fever, malaise/fatigue  and weight loss.  Eyes:  Negative for blurred vision.  Respiratory:  Negative for shortness of breath.   Cardiovascular:  Negative for chest pain.  Neurological:  Positive for sensory change. Negative for dizziness, focal weakness, weakness and headaches.      Objective:     BP 112/66 (BP Location: Left Arm, Patient Position: Sitting, Cuff Size: Normal)   Pulse 63   Ht '5\' 2"'$  (1.575 m)   Wt 154 lb 4.8 oz (70 kg)   SpO2 100%   BMI 28.22 kg/m  BP Readings from Last 3 Encounters:  05/22/22 112/66  02/06/22 106/60  07/25/21 118/70   Wt Readings from Last 3 Encounters:   05/22/22 154 lb 4.8 oz (70 kg)  02/06/22 155 lb 1.6 oz (70.4 kg)  10/09/21 150 lb (68 kg)      Physical Exam Vitals reviewed.  Constitutional:      General: She is not in acute distress.    Appearance: She is well-developed. She is not ill-appearing.  Eyes:     Pupils: Pupils are equal, round, and reactive to light.  Neck:     Thyroid: No thyromegaly.     Vascular: No JVD.  Cardiovascular:     Rate and Rhythm: Normal rate and regular rhythm.     Heart sounds:     No gallop.  Pulmonary:     Effort: Pulmonary effort is normal. No respiratory distress.     Breath sounds: Normal breath sounds. No wheezing or rales.  Musculoskeletal:     Cervical back: Neck supple.     Right lower leg: No edema.     Left lower leg: No edema.  Neurological:     General: No focal deficit present.     Mental Status: She is alert.     Cranial Nerves: No cranial nerve deficit.     Comments: Intact sensation with monofilament testing in both feet      No results found for any visits on 05/22/22.  Last CBC Lab Results  Component Value Date   WBC 6.9 02/16/2022   HGB 14.4 02/16/2022   HCT 41.2 02/16/2022   MCV 93.2 02/16/2022   MCH 32.6 02/16/2022   RDW 12.3 02/16/2022   PLT 245 [id number]   Last metabolic panel Lab Results  Component Value Date   GLUCOSE 94 02/16/2022   NA 140 02/16/2022   K 4.2 02/16/2022   CL 106 02/16/2022   CO2 26 02/16/2022   BUN 29 (H) 02/16/2022   CREATININE 0.88 02/16/2022   GFRNONAA >60 04/20/2021   CALCIUM 9.5 02/16/2022   PROT 6.6 02/16/2022   ALBUMIN 3.5 11/27/2012   BILITOT 0.3 02/16/2022   ALKPHOS 59 11/27/2012   AST 15 02/16/2022   ALT 18 02/16/2022   ANIONGAP 10 04/20/2021   Last thyroid functions Lab Results  Component Value Date   TSH 1.07 02/16/2022   Last vitamin D No results found for: "25OHVITD2", "25OHVITD3", "VD25OH"    The ASCVD Risk score (Arnett DK, et al., 2019) failed to calculate for the following reasons:   Cannot  find a previous HDL lab   Cannot find a previous total cholesterol lab    Assessment & Plan:   #1 osteoporosis.  Recent DEXA scan results reviewed with patient.  She had T-score -2.6 left femur neck.  She has risk factors including ethnicity, family history, smoking history, excessive alcohol use.  She is fairly active and plays tennis at least 2 times per week.  -Recommend continue weightbearing  exercise -Recommend at least 1200 mg calcium per day and vitamin D 800 to 1000 international units -We discussed potential osteoporosis medical therapies at some length.  Would be reluctant to consider bisphosphonates given her longstanding history of GERD and recurrent esophageal stricture.  We discussed alternative of Prolia and she is interested in considering that. -Return for future labs including CMP, 25-hydroxy vitamin D, magnesium -Will try to initiate process for getting Prolia approved.  Reviewed potential side effects  #2 peripheral neuropathy involving upper and lower extremities.  She has had symptoms for at least 2 years.  Needs further evaluation. We did discuss the fact this could possibly related to her alcohol consumption.  We recommended the following  -Scale back or discontinue alcohol altogether -Check further labs including A1c, serum protein electrophoresis, B12 level.  She had recent TSH which was normal -We did discuss possible neurology referral but will get follow-up on above labs first   No follow-ups on file.    Carolann Littler, MD

## 2022-05-22 NOTE — Patient Instructions (Signed)
Try to quit smoking and scale back alcohol use.

## 2022-05-25 ENCOUNTER — Other Ambulatory Visit (INDEPENDENT_AMBULATORY_CARE_PROVIDER_SITE_OTHER): Payer: Medicare Other

## 2022-05-25 DIAGNOSIS — G629 Polyneuropathy, unspecified: Secondary | ICD-10-CM | POA: Diagnosis not present

## 2022-05-25 DIAGNOSIS — M810 Age-related osteoporosis without current pathological fracture: Secondary | ICD-10-CM

## 2022-05-25 DIAGNOSIS — R739 Hyperglycemia, unspecified: Secondary | ICD-10-CM

## 2022-05-25 LAB — COMPREHENSIVE METABOLIC PANEL
ALT: 19 U/L (ref 0–35)
AST: 18 U/L (ref 0–37)
Albumin: 4 g/dL (ref 3.5–5.2)
Alkaline Phosphatase: 84 U/L (ref 39–117)
BUN: 19 mg/dL (ref 6–23)
CO2: 28 mEq/L (ref 19–32)
Calcium: 9.8 mg/dL (ref 8.4–10.5)
Chloride: 105 mEq/L (ref 96–112)
Creatinine, Ser: 0.92 mg/dL (ref 0.40–1.20)
GFR: 63.81 mL/min (ref >60.00)
Glucose, Bld: 100 mg/dL — ABNORMAL HIGH (ref 70–99)
Potassium: 4.6 mEq/L (ref 3.5–5.1)
Sodium: 140 mEq/L (ref 135–145)
Total Bilirubin: 0.5 mg/dL (ref 0.2–1.2)
Total Protein: 6.8 g/dL (ref 6.0–8.3)

## 2022-05-25 LAB — HEMOGLOBIN A1C: Hgb A1c MFr Bld: 5.7 % (ref 4.6–6.5)

## 2022-05-25 LAB — VITAMIN D 25 HYDROXY (VIT D DEFICIENCY, FRACTURES): VITD: 17.46 ng/mL — ABNORMAL LOW (ref 30.00–100.00)

## 2022-05-25 LAB — VITAMIN B12: Vitamin B-12: 1037 pg/mL — ABNORMAL HIGH (ref 211–911)

## 2022-05-25 LAB — MAGNESIUM: Magnesium: 2.1 mg/dL (ref 1.5–2.5)

## 2022-05-26 DIAGNOSIS — T1512XA Foreign body in conjunctival sac, left eye, initial encounter: Secondary | ICD-10-CM | POA: Diagnosis not present

## 2022-05-29 ENCOUNTER — Encounter: Payer: Self-pay | Admitting: Family Medicine

## 2022-06-02 LAB — MULTIPLE MYELOMA PANEL, SERUM
Albumin SerPl Elph-Mcnc: 3.9 g/dL (ref 2.9–4.4)
Albumin/Glob SerPl: 1.5 (ref 0.7–1.7)
Alpha 1: 0.2 g/dL (ref 0.0–0.4)
Alpha2 Glob SerPl Elph-Mcnc: 0.6 g/dL (ref 0.4–1.0)
B-Globulin SerPl Elph-Mcnc: 0.9 g/dL (ref 0.7–1.3)
Gamma Glob SerPl Elph-Mcnc: 1 g/dL (ref 0.4–1.8)
Globulin, Total: 2.7 g/dL (ref 2.2–3.9)
IgA/Immunoglobulin A, Serum: 255 mg/dL (ref 87–352)
IgG (Immunoglobin G), Serum: 944 mg/dL (ref 586–1602)
IgM (Immunoglobulin M), Srm: 76 mg/dL (ref 26–217)
Total Protein: 6.6 g/dL (ref 6.0–8.5)

## 2022-06-06 ENCOUNTER — Telehealth: Payer: Self-pay | Admitting: Family Medicine

## 2022-06-06 ENCOUNTER — Other Ambulatory Visit: Payer: Self-pay | Admitting: Family Medicine

## 2022-06-06 NOTE — Telephone Encounter (Signed)
Patient states she is ready for her Prolia injection

## 2022-06-06 NOTE — Telephone Encounter (Signed)
Patient is scheduled for 06/11/22 at 2pm.

## 2022-06-11 ENCOUNTER — Ambulatory Visit (INDEPENDENT_AMBULATORY_CARE_PROVIDER_SITE_OTHER): Payer: Medicare Other

## 2022-06-11 DIAGNOSIS — M810 Age-related osteoporosis without current pathological fracture: Secondary | ICD-10-CM

## 2022-06-11 MED ORDER — DENOSUMAB 60 MG/ML ~~LOC~~ SOSY
60.0000 mg | PREFILLED_SYRINGE | Freq: Once | SUBCUTANEOUS | Status: AC
  Administered 2022-06-11: 60 mg via SUBCUTANEOUS

## 2022-06-11 NOTE — Progress Notes (Signed)
Per orders of Dr. Legrand Como, injection of Denosumab 60 mg/ml given by Tersa Fotopoulos L Casper Pagliuca. Patient tolerated injection well.

## 2022-06-20 ENCOUNTER — Other Ambulatory Visit: Payer: Self-pay | Admitting: Family

## 2022-06-20 ENCOUNTER — Other Ambulatory Visit: Payer: Self-pay | Admitting: Family Medicine

## 2022-07-18 ENCOUNTER — Encounter: Payer: Self-pay | Admitting: Internal Medicine

## 2022-07-18 ENCOUNTER — Ambulatory Visit (INDEPENDENT_AMBULATORY_CARE_PROVIDER_SITE_OTHER): Payer: Medicare Other | Admitting: Internal Medicine

## 2022-07-18 VITALS — BP 124/84 | HR 70 | Temp 97.8°F | Wt 151.0 lb

## 2022-07-18 DIAGNOSIS — N3001 Acute cystitis with hematuria: Secondary | ICD-10-CM

## 2022-07-18 LAB — URINALYSIS
Bilirubin Urine: NEGATIVE
Ketones, ur: NEGATIVE
Leukocytes,Ua: NEGATIVE
Nitrite: NEGATIVE
Specific Gravity, Urine: 1.02 (ref 1.000–1.030)
Total Protein, Urine: NEGATIVE
Urine Glucose: NEGATIVE
Urobilinogen, UA: 0.2 (ref 0.0–1.0)
pH: 6 (ref 5.0–8.0)

## 2022-07-18 LAB — POC URINALSYSI DIPSTICK (AUTOMATED)
Glucose, UA: NEGATIVE
Ketones, UA: POSITIVE
Leukocytes, UA: NEGATIVE
Nitrite, UA: NEGATIVE
Protein, UA: POSITIVE — AB
Spec Grav, UA: 1.025 (ref 1.010–1.025)
Urobilinogen, UA: 0.2 E.U./dL
pH, UA: 6 (ref 5.0–8.0)

## 2022-07-18 MED ORDER — SULFAMETHOXAZOLE-TRIMETHOPRIM 800-160 MG PO TABS
1.0000 | ORAL_TABLET | Freq: Two times a day (BID) | ORAL | 0 refills | Status: AC
Start: 2022-07-18 — End: 2022-07-25

## 2022-07-18 NOTE — Progress Notes (Signed)
Established Patient Office Visit     CC/Reason for Visit: "I think I have a UTI"  HPI: Debra Trevino is a 67 y.o. female who is coming in today for the above mentioned reasons.  For the past 3 days she has been having increased urinary frequency, urgency and dysuria.  She took some over-the-counter Azo with minimal relief.   Past Medical/Surgical History: Past Medical History:  Diagnosis Date   Bilateral foot pain    morton neuroma, seeing podiatrist   CTS (carpal tunnel syndrome)    has seen hand specilist   Depression 03/24/2007   Qualifier: Diagnosis of  By: Lovell Sheehan MD, Balinda Quails    Gastric ulcer    per patient   GERD (gastroesophageal reflux disease) 05/27/2014   Sees Dr. Kinnie Scales; hx erosive esophagitis   Hot flashes    recur when stops HRT   HTN (hypertension)    Low back pain    chronic   Migraines    resolved on HRT for hot flases   Muscle spasm    very active, muscle spasm after tennis matches    Past Surgical History:  Procedure Laterality Date   CARPAL TUNNEL RELEASE     right hand   MEDIAL PARTIAL KNEE REPLACEMENT  2017   Right knee   REVISION TOTAL KNEE ARTHROPLASTY Left    02/2021   TONSILLECTOMY      Social History:  reports that she has been smoking cigarettes. She has a 50.00 pack-year smoking history. She has never used smokeless tobacco. She reports current alcohol use. She reports that she does not use drugs.  Allergies: Allergies  Allergen Reactions   Penicillins Swelling    Has patient had a PCN reaction causing immediate rash, facial/tongue/throat swelling, SOB or lightheadedness with hypotension:YES Has patient had a PCN reaction causing severe rash involving mucus membranes or skin necrosis: NO Has patient had a PCN reaction that required hospitalization NO Has patient had a PCN reaction occurring within the last 10 years: NO If all of the above answers are "NO", then may proceed with Cephalosporin use.   Tape Rash    Pt is not  sure if paper tape also causes the rash. Reaction was to surgical tape    Family History:  Family History  Problem Relation Age of Onset   Charcot-Marie-Tooth disease Father    Renal cancer Mother    Schizophrenia Brother    Hypertension Other      Current Outpatient Medications:    Acetaminophen (TYLENOL PO), Take 1 tablet by mouth daily as needed (prior to physical therapy)., Disp: , Rfl:    bisoprolol-hydrochlorothiazide (ZIAC) 2.5-6.25 MG tablet, TAKE 1 TABLET BY MOUTH EVERY DAY, Disp: 90 tablet, Rfl: 1   citalopram (CELEXA) 20 MG tablet, TAKE 1 TABLET BY MOUTH EVERY DAY, Disp: 90 tablet, Rfl: 1   cyclobenzaprine (FLEXERIL) 10 MG tablet, TAKE 1 TABLET BY MOUTH EVERYDAY AT BEDTIME, Disp: 20 tablet, Rfl: 0   diclofenac Sodium (VOLTAREN) 1 % GEL, Apply 1 application topically 2 (two) times daily as needed (pain)., Disp: , Rfl:    gabapentin (NEURONTIN) 100 MG capsule, Take 100 mg by mouth at bedtime. , Disp: , Rfl:    meloxicam (MOBIC) 15 MG tablet, Take 15 mg by mouth every morning., Disp: , Rfl:    omeprazole (PRILOSEC) 20 MG capsule, Take 1 capsule (20 mg total) by mouth every morning., Disp: 90 capsule, Rfl: 3   sulfamethoxazole-trimethoprim (BACTRIM DS) 800-160 MG tablet, Take  1 tablet by mouth 2 (two) times daily for 7 days., Disp: 14 tablet, Rfl: 0  Review of Systems:  Negative unless indicated in HPI.   Physical Exam: Vitals:   07/18/22 1153  BP: 124/84  Pulse: 70  Temp: 97.8 F (36.6 C)  TempSrc: Oral  SpO2: 98%  Weight: 151 lb (68.5 kg)    Body mass index is 27.62 kg/m.   Physical Exam Vitals reviewed.  Constitutional:      Appearance: Normal appearance.  HENT:     Head: Normocephalic and atraumatic.  Eyes:     Conjunctiva/sclera: Conjunctivae normal.     Pupils: Pupils are equal, round, and reactive to light.  Skin:    General: Skin is warm and dry.  Neurological:     General: No focal deficit present.     Mental Status: She is alert and oriented  to person, place, and time.  Psychiatric:        Mood and Affect: Mood normal.        Behavior: Behavior normal.        Thought Content: Thought content normal.        Judgment: Judgment normal.      Impression and Plan:  Acute cystitis with hematuria -     POCT Urinalysis Dipstick (Automated) -     Urinalysis -     Urine Culture; Future -     Sulfamethoxazole-Trimethoprim; Take 1 tablet by mouth 2 (two) times daily for 7 days.  Dispense: 14 tablet; Refill: 0   -In office urine dipstick positive for protein, leukocytes and blood.  If hematuria persists, continue imaging/GU referral.  Time spent:22 minutes reviewing chart, interviewing and examining patient and formulating plan of care.     Chaya Jan, MD [facility]

## 2022-07-20 LAB — URINE CULTURE
MICRO NUMBER:: 14930176
SPECIMEN QUALITY:: ADEQUATE

## 2022-07-24 DIAGNOSIS — D225 Melanocytic nevi of trunk: Secondary | ICD-10-CM | POA: Diagnosis not present

## 2022-07-24 DIAGNOSIS — L570 Actinic keratosis: Secondary | ICD-10-CM | POA: Diagnosis not present

## 2022-07-24 DIAGNOSIS — L821 Other seborrheic keratosis: Secondary | ICD-10-CM | POA: Diagnosis not present

## 2022-07-24 DIAGNOSIS — D1801 Hemangioma of skin and subcutaneous tissue: Secondary | ICD-10-CM | POA: Diagnosis not present

## 2022-07-24 DIAGNOSIS — L814 Other melanin hyperpigmentation: Secondary | ICD-10-CM | POA: Diagnosis not present

## 2022-07-24 DIAGNOSIS — L905 Scar conditions and fibrosis of skin: Secondary | ICD-10-CM | POA: Diagnosis not present

## 2022-07-24 DIAGNOSIS — Z85828 Personal history of other malignant neoplasm of skin: Secondary | ICD-10-CM | POA: Diagnosis not present

## 2022-07-25 DIAGNOSIS — K219 Gastro-esophageal reflux disease without esophagitis: Secondary | ICD-10-CM | POA: Diagnosis not present

## 2022-07-25 DIAGNOSIS — R197 Diarrhea, unspecified: Secondary | ICD-10-CM | POA: Diagnosis not present

## 2022-08-21 ENCOUNTER — Encounter: Payer: Self-pay | Admitting: Family Medicine

## 2022-08-21 ENCOUNTER — Ambulatory Visit (INDEPENDENT_AMBULATORY_CARE_PROVIDER_SITE_OTHER): Payer: Medicare Other | Admitting: Family Medicine

## 2022-08-21 VITALS — BP 106/64 | HR 70 | Ht 62.0 in | Wt 148.6 lb

## 2022-08-21 DIAGNOSIS — S0081XA Abrasion of other part of head, initial encounter: Secondary | ICD-10-CM

## 2022-08-21 DIAGNOSIS — Z23 Encounter for immunization: Secondary | ICD-10-CM

## 2022-08-21 DIAGNOSIS — S50812A Abrasion of left forearm, initial encounter: Secondary | ICD-10-CM | POA: Diagnosis not present

## 2022-08-21 NOTE — Progress Notes (Signed)
Established Patient Office Visit  Subjective   Patient ID: Debra Trevino, female    DOB: 1955/06/25  Age: 67 y.o. MRN: [id number]  Chief Complaint  Patient presents with   Fall    Patient complains of fall, x4 days    HPI   Debra Trevino is seen following fall which occurred last Friday night.  She was taking out her dog and had on flip-flops and tripped in the grass and fell forward landing on her face.  There was no loss of consciousness.  No immediate neck pain.  No extremity injury.  She had abrasions involving her nose and especially right side of face as well as left forearm.  Last tetanus was 10 years ago.  She had a brief nosebleed there was concern initially about nasal fracture.  Has not noted any deformity of the nose no difficulty breathing.  No falls since then.  Has not any confusion or headache but has had some mild nonspecific lightheadedness and wonders if she may have had a mild concussion.  Past Medical History:  Diagnosis Date   Bilateral foot pain    morton neuroma, seeing podiatrist   CTS (carpal tunnel syndrome)    has seen hand specilist   Depression 03/24/2007   Qualifier: Diagnosis of  By: Lovell Sheehan MD, Balinda Quails    Gastric ulcer    per patient   GERD (gastroesophageal reflux disease) 05/27/2014   Sees Dr. Kinnie Scales; hx erosive esophagitis   Hot flashes    recur when stops HRT   HTN (hypertension)    Low back pain    chronic   Migraines    resolved on HRT for hot flases   Muscle spasm    very active, muscle spasm after tennis matches   Past Surgical History:  Procedure Laterality Date   CARPAL TUNNEL RELEASE     right hand   MEDIAL PARTIAL KNEE REPLACEMENT  2017   Right knee   REVISION TOTAL KNEE ARTHROPLASTY Left    02/2021   TONSILLECTOMY      reports that she has been smoking cigarettes. She has a 50.00 pack-year smoking history. She has never used smokeless tobacco. She reports current alcohol use. She reports that she does not use drugs. family  history includes Charcot-Marie-Tooth disease in her father; Hypertension in an other family member; Renal cancer in her mother; Schizophrenia in her brother. Allergies  Allergen Reactions   Penicillins Swelling    Has patient had a PCN reaction causing immediate rash, facial/tongue/throat swelling, SOB or lightheadedness with hypotension:YES Has patient had a PCN reaction causing severe rash involving mucus membranes or skin necrosis: NO Has patient had a PCN reaction that required hospitalization NO Has patient had a PCN reaction occurring within the last 10 years: NO If all of the above answers are "NO", then may proceed with Cephalosporin use.   Tape Rash    Pt is not sure if paper tape also causes the rash. Reaction was to surgical tape    Review of Systems  Constitutional:  Negative for chills and fever.  Cardiovascular:  Negative for chest pain.  Neurological:  Negative for focal weakness, loss of consciousness and headaches.      Objective:     BP 106/64 (BP Location: Left Arm, Patient Position: Sitting, Cuff Size: Normal)   Pulse 70   Ht 5\' 2"  (1.575 m)   Wt 148 lb 9.6 oz (67.4 kg)   SpO2 98%   BMI 27.18 kg/m  BP Readings  from Last 3 Encounters:  08/21/22 106/64  07/18/22 124/84  05/22/22 112/66   Wt Readings from Last 3 Encounters:  08/21/22 148 lb 9.6 oz (67.4 kg)  07/18/22 151 lb (68.5 kg)  05/22/22 154 lb 4.8 oz (70 kg)      Physical Exam Vitals reviewed.  Constitutional:      General: She is not in acute distress.    Appearance: Normal appearance.  HENT:     Head:     Comments: He has multiple superficial abrasions mostly over the left side of the face around the right eye and distal right side of nose and right upper lip area.  Denies any tenderness in palpating around the nasal bone region.  No cellulitis changes. Neck:     Comments: No cervical spinal tenderness Cardiovascular:     Rate and Rhythm: Normal rate and regular rhythm.  Pulmonary:      Effort: Pulmonary effort is normal.     Breath sounds: Normal breath sounds. No wheezing or rales.  Musculoskeletal:     Cervical back: Neck supple.     Right lower leg: No edema.     Left lower leg: No edema.  Skin:    Comments: Abrasion left dorsal forearm.  No signs of secondary infection.  Neurological:     Mental Status: She is alert.      No results found for any visits on 08/21/22.    The ASCVD Risk score (Arnett DK, et al., 2019) failed to calculate for the following reasons:   Cannot find a previous HDL lab   Cannot find a previous total cholesterol lab    Assessment & Plan:   Recent fall with multiple abrasions including left forearm and face.  Low clinical suspicion for nasal fracture.  We offered imaging of her nasal bones but explained this would probably not change management even if she had nondisplaced fracture.  She has no significant nasal bone tenderness and we recommended against x-ray at this time.  -Reviewed proper wound care for abrasions.  Avoid further hydrogen peroxide.  Keep clean with soap and water.  Consider topical Vaseline -Watch closely for any signs of secondary infection -Tetanus booster given   No follow-ups on file.    Evelena Peat, MD

## 2022-09-28 ENCOUNTER — Other Ambulatory Visit: Payer: Self-pay

## 2022-09-28 ENCOUNTER — Other Ambulatory Visit: Payer: Medicare Other

## 2022-09-28 DIAGNOSIS — R7989 Other specified abnormal findings of blood chemistry: Secondary | ICD-10-CM

## 2022-09-28 DIAGNOSIS — M810 Age-related osteoporosis without current pathological fracture: Secondary | ICD-10-CM

## 2022-10-03 ENCOUNTER — Other Ambulatory Visit: Payer: Medicare Other

## 2022-10-03 DIAGNOSIS — M810 Age-related osteoporosis without current pathological fracture: Secondary | ICD-10-CM

## 2022-10-03 LAB — VITAMIN D 25 HYDROXY (VIT D DEFICIENCY, FRACTURES): VITD: 40.34 ng/mL (ref 30.00–100.00)

## 2022-10-27 ENCOUNTER — Other Ambulatory Visit: Payer: Self-pay | Admitting: Family Medicine

## 2022-11-02 ENCOUNTER — Inpatient Hospital Stay: Admission: RE | Admit: 2022-11-02 | Payer: Medicare Other | Source: Ambulatory Visit

## 2022-11-05 ENCOUNTER — Telehealth: Payer: Self-pay | Admitting: *Deleted

## 2022-11-05 ENCOUNTER — Other Ambulatory Visit: Payer: Self-pay

## 2022-11-05 DIAGNOSIS — Z87891 Personal history of nicotine dependence: Secondary | ICD-10-CM

## 2022-11-05 DIAGNOSIS — Z122 Encounter for screening for malignant neoplasm of respiratory organs: Secondary | ICD-10-CM

## 2022-11-05 DIAGNOSIS — F17210 Nicotine dependence, cigarettes, uncomplicated: Secondary | ICD-10-CM

## 2022-11-05 NOTE — Telephone Encounter (Signed)
Week of Sept 9th is good.

## 2022-11-05 NOTE — Telephone Encounter (Signed)
Please call PT to reshced CT scan she had to cancel. Her # is [phone]

## 2022-11-06 NOTE — Telephone Encounter (Signed)
Pt 's LDCT rescheduled for 11/22/22.

## 2022-11-19 ENCOUNTER — Telehealth: Payer: Self-pay

## 2022-11-19 NOTE — Telephone Encounter (Signed)
Pt ready for scheduling on or after 10/1.  Estimated out-of-pocket cost due at time of visit: $0  Primary Insurance:Medicare  Secondary Insurance:[employer]  Deductible: $240 out of $240.  Eligible for co-pay program: No  Prior Auth: Not needed PA#:  Valid:   This summary of benefits is an estimation of the patient's out-of-pocket cost. Exact cost may vary based on individual plan coverage.

## 2022-11-21 ENCOUNTER — Encounter: Payer: Self-pay | Admitting: Neurology

## 2022-11-21 ENCOUNTER — Ambulatory Visit (INDEPENDENT_AMBULATORY_CARE_PROVIDER_SITE_OTHER): Payer: Medicare Other | Admitting: Neurology

## 2022-11-21 ENCOUNTER — Other Ambulatory Visit (INDEPENDENT_AMBULATORY_CARE_PROVIDER_SITE_OTHER): Payer: Medicare Other

## 2022-11-21 VITALS — BP 107/68 | HR 60 | Ht 62.0 in | Wt 152.0 lb

## 2022-11-21 DIAGNOSIS — G629 Polyneuropathy, unspecified: Secondary | ICD-10-CM

## 2022-11-21 DIAGNOSIS — R202 Paresthesia of skin: Secondary | ICD-10-CM

## 2022-11-21 LAB — FOLATE: Folate: 9 ng/mL (ref >5.9)

## 2022-11-21 NOTE — Patient Instructions (Signed)
Check labs  Nerve testing of the right arm and leg  ELECTROMYOGRAM AND NERVE CONDUCTION STUDIES (EMG/NCS) INSTRUCTIONS  How to Prepare The neurologist conducting the EMG will need to know if you have certain medical conditions. Tell the neurologist and other EMG lab personnel if you: Have a pacemaker or any other electrical medical device Take blood-thinning medications Have hemophilia, a blood-clotting disorder that causes prolonged bleeding Bathing Take a shower or bath shortly before your exam in order to remove oils from your skin. Don't apply lotions or creams before the exam.  What to Expect You'll likely be asked to change into a hospital gown for the procedure and lie down on an examination table. The following explanations can help you understand what will happen during the exam.  Electrodes. The neurologist or a technician places surface electrodes at various locations on your skin depending on where you're experiencing symptoms. Or the neurologist may insert needle electrodes at different sites depending on your symptoms.  Sensations. The electrodes will at times transmit a tiny electrical current that you may feel as a twinge or spasm. The needle electrode may cause discomfort or pain that usually ends shortly after the needle is removed. If you are concerned about discomfort or pain, you may want to talk to the neurologist about taking a short break during the exam.  Instructions. During the needle EMG, the neurologist will assess whether there is any spontaneous electrical activity when the muscle is at rest - activity that isn't present in healthy muscle tissue - and the degree of activity when you slightly contract the muscle.  He or she will give you instructions on resting and contracting a muscle at appropriate times. Depending on what muscles and nerves the neurologist is examining, he or she may ask you to change positions during the exam.  After your EMG You may experience  some temporary, minor bruising where the needle electrode was inserted into your muscle. This bruising should fade within several days. If it persists, contact your primary care doctor.   

## 2022-11-21 NOTE — Progress Notes (Signed)
Follow-up Visit   Date: 11/21/2022    Debra Trevino MRN: [id number] DOB: Jun 27, 1955    Debra Trevino is a 67 y.o. left-handed Caucasian female with GERD, depression, GERD, hypertension, migraines, alcohol use, and tobacco use returning to the clinic for follow-up of numbness/tingling.  The patient was accompanied to the clinic by self.  IMPRESSION/PLAN: Paresthesias of the hands and feet.  By history, symptoms are suggestive of neuropathy in the feet possible entrapment neuropathy in the hands.  She has family history of Charcot-Marie-Tooth, however, patient does not have any clinical features of this.  She does have history of alcohol use and folate deficiency, both of which can contribute to neuropathy.  She has been advised to try to cut back on her alcohol consumption to prevent progression.  Her exam shows hyperreflexia which is most likely due to her known lumbar canal stenosis, as well as intact distal sensation which is more atypical for neuropathy.  - Check vitamin B1, folate, copper, SPEP with IFE  - NCS/EMG of the right and leg  Return to clinic in 4 months.  --------------------------------------------- UPDATE 11/21/2022:  She has numbness/tingling involving the soles feet since around 2020.  Symptoms are constant.  Symptoms are slightly relieved by topical ointment.  Balance is not good and feels wobbly.  She has family history of CMT in her father and maternal uncle.  She also has lumbar canal stenosis and was previously suggested surgery, however symptoms improved with conservative therapies.  She was seen by Dr. Wynetta Emery.  She also complains of numbness/tingling in the hands for the past several years.  She has difficulty holding objects and can drop things easily.  Strength in the hands has reduced.  Medications:  Current Outpatient Medications on File Prior to Visit  Medication Sig Dispense Refill   Acetaminophen (TYLENOL PO) Take 1 tablet by mouth daily as  needed (prior to physical therapy).     bisoprolol-hydrochlorothiazide (ZIAC) 2.5-6.25 MG tablet TAKE 1 TABLET BY MOUTH EVERY DAY 90 tablet 1   citalopram (CELEXA) 20 MG tablet TAKE 1 TABLET BY MOUTH EVERY DAY 90 tablet 1   cyclobenzaprine (FLEXERIL) 10 MG tablet TAKE 1 TABLET BY MOUTH EVERYDAY AT BEDTIME 20 tablet 0   diclofenac Sodium (VOLTAREN) 1 % GEL Apply 1 application topically 2 (two) times daily as needed (pain).     gabapentin (NEURONTIN) 100 MG capsule Take 100 mg by mouth at bedtime.      meloxicam (MOBIC) 15 MG tablet Take 15 mg by mouth every morning.     omeprazole (PRILOSEC) 20 MG capsule Take 1 capsule (20 mg total) by mouth every morning. 90 capsule 3   No current facility-administered medications on file prior to visit.    Allergies:  Allergies  Allergen Reactions   Penicillins Swelling    Has patient had a PCN reaction causing immediate rash, facial/tongue/throat swelling, SOB or lightheadedness with hypotension:YES Has patient had a PCN reaction causing severe rash involving mucus membranes or skin necrosis: NO Has patient had a PCN reaction that required hospitalization NO Has patient had a PCN reaction occurring within the last 10 years: NO If all of the above answers are "NO", then may proceed with Cephalosporin use.   Tape Rash    Pt is not sure if paper tape also causes the rash. Reaction was to surgical tape    Vital Signs:  BP 107/68   Pulse 60   Ht 5\' 2"  (1.575 m)   Wt 152 lb (  68.9 kg)   SpO2 97%   BMI 27.80 kg/m    Neurological Exam: MENTAL STATUS including orientation to time, place, person, recent and remote memory, attention span and concentration, language, and fund of knowledge is normal.  Speech is not dysarthric.  CRANIAL NERVES:  No visual field defects.  Pupils equal round and reactive to light.  Normal conjugate, extra-ocular eye movements in all directions of gaze.  No ptosis.  Face is symmetric. Palate elevates symmetrically.  Tongue is  midline.  MOTOR:  Motor strength is 5/5 in all extremities, including distally in the hands and feet.  No atrophy, fasciculations or abnormal movements.  No pronator drift.  Tone is normal.    MSRs:  Reflexes are 2+/4 throughout and 3+/4 bilateral patella.  SENSORY:  Intact to vibration, pinprick and temperature throughout.  COORDINATION/GAIT:  Normal finger-to- nose-finger.  Intact rapid alternating movements bilaterally.  Gait narrow based and stable.  Stressed and tandem gait intact.  Data: MRI cervical spine wwo contrast 04/21/2021: 1. No acute findings are identified within the cervical spine. Findings are similar to previous MRI from 7 months prior. 2. No evidence of myelopathy or abnormal intradural enhancement. 3. Spondylosis as described, greatest at C5-6 and C6-7. At C5-6, there is moderate to severe foraminal narrowing on the left with possible left C6 nerve root encroachment.  MRI brain wo contrast 04/21/2021: 1. No acute intracranial abnormality. 2. 11 mm meningioma at the posterior right parieto-occipital convexity without associated edema or mass effect. 3. Otherwise normal brain MRI for age.    Thank you for allowing me to participate in patient's care.  If I can answer any additional questions, I would be pleased to do so.    Sincerely,    Addilee Neu K. Allena Katz, DO  :

## 2022-11-22 ENCOUNTER — Ambulatory Visit
Admission: RE | Admit: 2022-11-22 | Discharge: 2022-11-22 | Disposition: A | Discharge status: Home / Self Care | Payer: Medicare Other | Source: Ambulatory Visit | Attending: Family Medicine | Admitting: Family Medicine

## 2022-11-22 DIAGNOSIS — F17210 Nicotine dependence, cigarettes, uncomplicated: Secondary | ICD-10-CM | POA: Diagnosis not present

## 2022-11-22 DIAGNOSIS — Z122 Encounter for screening for malignant neoplasm of respiratory organs: Secondary | ICD-10-CM

## 2022-11-22 DIAGNOSIS — Z87891 Personal history of nicotine dependence: Secondary | ICD-10-CM

## 2022-11-23 ENCOUNTER — Other Ambulatory Visit: Payer: Self-pay

## 2022-11-23 ENCOUNTER — Ambulatory Visit (INDEPENDENT_AMBULATORY_CARE_PROVIDER_SITE_OTHER): Payer: Medicare Other | Admitting: Neurology

## 2022-11-23 DIAGNOSIS — Z23 Encounter for immunization: Secondary | ICD-10-CM | POA: Diagnosis not present

## 2022-11-23 DIAGNOSIS — M5412 Radiculopathy, cervical region: Secondary | ICD-10-CM

## 2022-11-23 DIAGNOSIS — R202 Paresthesia of skin: Secondary | ICD-10-CM | POA: Diagnosis not present

## 2022-11-23 NOTE — Procedures (Signed)
[facility]  [address] [place], [address]  [place], [place] [address] Tel: [phone] Fax: [phone] Test Date:  11/23/2022  Patient: Debra Trevino DOB: 1955/06/01 Physician: Nita Sickle, DO  Sex: Female Height: 5\' 2"  Ref Phys: Nita Sickle, DO  ID#: [id number]   Technician:    History: This is a 67 year old female referred for evaluation of bilateral hand and feet paresthesias.  NCV & EMG Findings: Extensive electrodiagnostic testing of the right upper and lower extremity shows:  Right median, ulnar, mixed palmar, sural, and superficial peroneal sensory responses are within normal limits.   Right median, ulnar, peroneal, and tibial motor responses are within normal limits.   Right tibial H reflex study is within normal limits.   In the right upper extremity, chronic motor axon loss changes are seen affecting the biceps and deltoid muscles, without accompanying active denervation.   In the right lower extremity, there is no evidence of active or chronic motor axonal loss changes affecting any of the tested muscles.  Motor unit configuration and recruitment pattern is within normal limits.    Impression: Chronic C5-6 radiculopathy affecting the right upper extremity, mild. There is no evidence of a large fiber sensorimotor polyneuropathy, lumbosacral radiculopathy, or carpal tunnel syndrome affecting the right side.   ___________________________ Nita Sickle, DO    Nerve Conduction Studies   Stim Site NR Peak (ms) Norm Peak (ms) O-P Amp (V) Norm O-P Amp  Right Median Anti Sensory (2nd Digit)  32 C  Wrist    3.4 <3.8 15.5 >10  Right Sup Peroneal Anti Sensory (Ant Lat Mall)  32 C  12 cm    2.3 <4.6 13.4 >3  Right Sural Anti Sensory (Lat Mall)  32 C  Calf    2.8 <4.6 11.6 >3  Right Ulnar Anti Sensory (5th Digit)  32 C  Wrist    2.2 <3.2 31.1 >5     Stim Site NR Onset (ms) Norm Onset (ms) O-P Amp (mV) Norm O-P Amp Site1 Site2 Delta-0 (ms) Dist (cm) Vel  (m/s) Norm Vel (m/s)  Right Median Motor (Abd Poll Brev)  32 C  Wrist    3.0 <4.0 6.7 >5 Elbow Wrist 4.7 27.0 57 >50  Elbow    7.7  6.4         Right Peroneal Motor (Ext Dig Brev)  32 C  Ankle    3.4 <6.0 6.3 >2.5 B Fib Ankle 6.7 36.0 54 >40  B Fib    10.1  5.0  Poplt B Fib 1.9 9.0 47 >40  Poplt    12.0  5.0         Right Tibial Motor (Abd Hall Brev)  32 C  Ankle    4.2 <6.0 15.9 >4 Knee Ankle 7.1 38.0 54 >40  Knee    11.3  13.3         Right Ulnar Motor (Abd Dig Minimi)  32 C  Wrist    2.0 <3.1 8.3 >7 B Elbow Wrist 3.3 20.0 61 >50  B Elbow    5.3  8.2  A Elbow B Elbow 1.4 10.0 71 >50  A Elbow    6.7  7.9            Stim Site NR Peak (ms) Norm Peak (ms) P-T Amp (V) Site1 Site2 Delta-P (ms) Norm Delta (ms)  Right Median/Ulnar Palm Comparison (Wrist - 8cm)  32 C  Median Palm    1.8 <2.2 102.3 Median Palm Ulnar Palm 0.3  Ulnar Palm    1.5 <2.2 17.6       Electromyography   Side Muscle Ins.Act Fibs Fasc Recrt Amp Dur Poly Activation Comment  Right 1stDorInt Nml Nml Nml Nml Nml Nml Nml Nml N/A  Right Abd Poll Brev Nml Nml Nml Nml Nml Nml Nml Nml N/A  Right PronatorTeres Nml Nml Nml Nml Nml Nml Nml Nml N/A  Right Biceps Nml Nml Nml *1- *1+ *1+ *1+ Nml N/A  Right Triceps Nml Nml Nml Nml Nml Nml Nml Nml N/A  Right Deltoid Nml Nml Nml *1- *1+ *1+ *1+ Nml N/A  Right AntTibialis Nml Nml Nml Nml Nml Nml Nml Nml N/A  Right Gastroc Nml Nml Nml Nml Nml Nml Nml Nml N/A  Right Flex Dig Long Nml Nml Nml Nml Nml Nml Nml Nml N/A  Right RectFemoris Nml Nml Nml Nml Nml Nml Nml Nml N/A  Right GluteusMed Nml Nml Nml Nml Nml Nml Nml Nml N/A      Waveforms:

## 2022-11-28 ENCOUNTER — Ambulatory Visit: Payer: Medicare Other | Attending: Neurology

## 2022-11-28 DIAGNOSIS — M5412 Radiculopathy, cervical region: Secondary | ICD-10-CM | POA: Diagnosis not present

## 2022-11-28 DIAGNOSIS — R202 Paresthesia of skin: Secondary | ICD-10-CM | POA: Diagnosis not present

## 2022-11-28 DIAGNOSIS — R262 Difficulty in walking, not elsewhere classified: Secondary | ICD-10-CM | POA: Diagnosis not present

## 2022-11-28 DIAGNOSIS — R2681 Unsteadiness on feet: Secondary | ICD-10-CM | POA: Insufficient documentation

## 2022-11-28 NOTE — Therapy (Signed)
OUTPATIENT PHYSICAL THERAPY NEURO EVALUATION   Patient Name: Debra Trevino MRN: [id number] DOB:03/17/1955, 67 y.o., female Today's Date: 11/28/2022   PCP: Kristian Covey, MD REFERRING PROVIDER: Glendale Chard, DO  END OF SESSION:  PT End of Session - 11/28/22 0935     Visit Number 1    Number of Visits 4    Date for PT Re-Evaluation 02/06/23    Authorization Type Medicare/Medicaid    PT Start Time 0935    PT Stop Time 1015    PT Time Calculation (min) 40 min             Past Medical History:  Diagnosis Date   Bilateral foot pain    morton neuroma, seeing podiatrist   CTS (carpal tunnel syndrome)    has seen hand specilist   Depression 03/24/2007   Qualifier: Diagnosis of  By: Lovell Sheehan MD, Balinda Quails    Gastric ulcer    per patient   GERD (gastroesophageal reflux disease) 05/27/2014   Sees Dr. Kinnie Scales; hx erosive esophagitis   Hot flashes    recur when stops HRT   HTN (hypertension)    Low back pain    chronic   Migraines    resolved on HRT for hot flases   Muscle spasm    very active, muscle spasm after tennis matches   Past Surgical History:  Procedure Laterality Date   CARPAL TUNNEL RELEASE     right hand   MEDIAL PARTIAL KNEE REPLACEMENT  2017   Right knee   REVISION TOTAL KNEE ARTHROPLASTY Left    02/2021   TONSILLECTOMY     Patient Active Problem List   Diagnosis Date Noted   Ataxia 04/21/2021   COPD (chronic obstructive pulmonary disease) (HCC) 04/21/2021   Tobacco dependence 04/21/2021   Meningioma (HCC) 04/21/2021   Encounter for screening for lung cancer 08/26/2019   Osteoarthritis, knee 03/07/2017   Family history of premature CAD 03/07/2017   Atypical chest pain 11/25/2014   Exertional shortness of breath 11/25/2014   GERD (gastroesophageal reflux disease) 05/27/2014   Essential hypertension 05/20/2007   Depression 03/24/2007   SYMPTOMATIC MENOPAUSAL/FEMALE CLIMACTERIC STATES 03/24/2007   ALLERGIC RHINITIS 09/30/2006    ONSET  DATE: several years  REFERRING DIAG: R20.2 (ICD-10-CM) - Paresthesia of hand, bilateral M54.12 (ICD-10-CM) - Cervical radiculopathy  THERAPY DIAG:  Unsteadiness on feet  Difficulty in walking, not elsewhere classified  Rationale for Evaluation and Treatment: Rehabilitation  SUBJECTIVE:  SUBJECTIVE STATEMENT: Reports numbness and tingling in hands/feet for several years.  Reports recent nerve conduction which reveals possibly lumbar involvement.  The UE test was less conclusive per pt report.  No surgical procedures to neck/back reported. Reports no interventions other than PT in the past for her back.  Notes that the foot neuropathy is more prominent at night. Notes that the dexterity in her hands has been affected by the arthritis in bilat wrists.   Pt accompanied by: self  PERTINENT HISTORY: GERD, depression, GERD, hypertension, migraines, alcohol use, and tobacco use  PAIN:  Are you having pain? Yes: NPRS scale: 2/10 Pain location: lower lumber, midline Pain description: aching Aggravating factors: carrying items, playing tennis Relieving factors: meloxicam, gabapentin  PRECAUTIONS: None  RED FLAGS: None   WEIGHT BEARING RESTRICTIONS: No  FALLS: Has patient fallen in last 6 months? Yes. Number of falls 1, reports tripping with possible concussion  LIVING ENVIRONMENT: Lives with: lives with their spouse Lives in: House/apartment Stairs: inside/outside Has following equipment at home: None  PLOF: Independent  PATIENT GOALS: improve symptoms and balance  OBJECTIVE:   DIAGNOSTIC FINDINGS:  MRI cervical spine wwo contrast 04/21/2021: 1. No acute findings are identified within the cervical spine. Findings are similar to previous MRI from 7 months prior. 2. No evidence of myelopathy or  abnormal intradural enhancement. 3. Spondylosis as described, greatest at C5-6 and C6-7. At C5-6, there is moderate to severe foraminal narrowing on the left with possible left C6 nerve root encroachment.   MRI brain wo contrast 04/21/2021: 1. No acute intracranial abnormality. 2. 11 mm meningioma at the posterior right parieto-occipital convexity without associated edema or mass effect. 3. Otherwise normal brain MRI for age.  COGNITION: Overall cognitive status: Within functional limits for tasks assessed   SENSATION: No 2-point discrimination appreciated bilat feet to mid-calf and distal hands/arms  COORDINATION: WFL  EDEMA:    MUSCLE TONE: NT  MUSCLE LENGTH:   DTRs:  NT  POSTURE: No Significant postural limitations  LOWER EXTREMITY ROM:     Active  Right Eval Left Eval  Hip flexion    Hip extension    Hip abduction    Hip adduction    Hip internal rotation    Hip external rotation    Knee flexion 120 120  Knee extension 0 0  Ankle dorsiflexion 15 15  Ankle plantarflexion    Ankle inversion    Ankle eversion     (Blank rows = not tested)  LOWER EXTREMITY MMT:    BLE grossly 5/5  BED MOBILITY:  indep  TRANSFERS: indep    CURB:  Level of Assistance: Complete Independence Assistive device utilized: None Curb Comments:   STAIRS: Level of Assistance: Complete Independence Stair Negotiation Technique: Alternating Pattern  with No Rails Number of Stairs: 12  Height of Stairs: 4-6  Comments:   GAIT: Gait pattern: poor foot clearance- Left Distance walked:  Assistive device utilized: None Level of assistance: Complete Independence Comments: decreased velocity: 2.4 ft/sec  FUNCTIONAL TESTS:  5 times sit to stand: NT Functional gait assessment: 23/30   M-CTSIB  Condition 1: Firm Surface, EO 30 Sec, Normal Sway  Condition 2: Firm Surface, EC 30 Sec, Normal Sway  Condition 3: Foam Surface, EO 30 Sec, Mild Sway  Condition 4: Foam Surface,  EC 10 Sec, Moderate Sway      PATIENT EDUCATION: Education details: discussion of assessment findings Person educated: Patient Education method: Explanation Education comprehension: verbalized understanding  HOME EXERCISE PROGRAM: TBD  GOALS: Goals reviewed with patient? Yes  SHORT TERM GOALS: Target date: same as LTG    LONG TERM GOALS: Target date: 02/06/2023    Patient will be independent in HEP to improve functional outcomes Baseline:  Goal status: INITIAL  2.  Demo improved postural stability per mild sway condition 4 M-CTSIB to improve safety with ADL Baseline: 10 sec moderate Goal status: INITIAL  3.  Reduce risk for falls per score 28/30 Functional Gait Assessment Baseline: 23/30 Goal status: INITIAL    ASSESSMENT:  CLINICAL IMPRESSION: Patient is a 67 y.o. lady who was seen today for physical therapy evaluation and treatment for parasthesias in bilateral LE and UE.  Exhibits decreased ability for 2-point discrimination in distal extremities and demonstrates balance and stability deficits evident by M-CTSIB conditions and gait disturbance per items of Functional Gait Assessment.  Patient would benefit from PT interventions to address deficits and limitations to improve functional performance and reduce risk for falls.  Pt reports she is leaving for extended vacation and will hopefully return for one visit and return to clinic after trip  OBJECTIVE IMPAIRMENTS: Abnormal gait, decreased balance, and impaired sensation.   ACTIVITY LIMITATIONS: carrying, stairs, and locomotion level  PARTICIPATION LIMITATIONS: community activity, yard work, and exercise activities  PERSONAL FACTORS: Time since onset of injury/illness/exacerbation are also affecting patient's functional outcome.   REHAB POTENTIAL: Good  CLINICAL DECISION MAKING: Stable/uncomplicated  EVALUATION COMPLEXITY: Low  PLAN:  PT FREQUENCY: 1x/week  PT DURATION: 4 weeks  PLANNED INTERVENTIONS:  Therapeutic exercises, Therapeutic activity, Neuromuscular re-education, Balance training, Gait training, Patient/Family education, Self Care, Joint mobilization, Stair training, Vestibular training, Canalith repositioning, Orthotic/Fit training, DME instructions, Aquatic Therapy, Spinal mobilization, and Manual therapy  PLAN FOR NEXT SESSION: corner balance activities for proprioception   11:22 AM, 11/28/22 M. Shary Decamp, PT, DPT Physical Therapist- [facility] Office Number: [phone]

## 2022-11-30 ENCOUNTER — Telehealth: Payer: Self-pay

## 2022-11-30 LAB — PROTEIN ELECTROPHORESIS, SERUM
Albumin ELP: 3.7 g/dL — ABNORMAL LOW (ref 3.8–4.8)
Alpha 1: 0.3 g/dL (ref 0.2–0.3)
Alpha 2: 0.5 g/dL (ref 0.5–0.9)
Beta 2: 0.4 g/dL (ref 0.2–0.5)
Beta Globulin: 0.4 g/dL (ref 0.4–0.6)
Gamma Globulin: 0.8 g/dL (ref 0.8–1.7)
Total Protein: 6.1 g/dL (ref 6.1–8.1)

## 2022-11-30 LAB — VITAMIN B1: Vitamin B1 (Thiamine): 12 nmol/L (ref 8–30)

## 2022-11-30 LAB — IMMUNOFIXATION ELECTROPHORESIS
IgG (Immunoglobin G), Serum: 894 mg/dL (ref 600–1540)
IgM, Serum: 62 mg/dL (ref 50–300)
Immunoglobulin A: 244 mg/dL (ref 70–320)

## 2022-11-30 LAB — COPPER, SERUM: Copper: 116 ug/dL (ref 70–175)

## 2022-11-30 NOTE — Telephone Encounter (Signed)
Pt called no answer left a voice mail per DPR that labs were normal

## 2022-11-30 NOTE — Telephone Encounter (Signed)
-----   Message from Antony Madura sent at 11/30/2022  8:01 AM EDT ----- Can we let patient know that her labs were normal?

## 2022-12-03 ENCOUNTER — Ambulatory Visit: Payer: Medicare Other

## 2022-12-03 DIAGNOSIS — M5412 Radiculopathy, cervical region: Secondary | ICD-10-CM | POA: Diagnosis not present

## 2022-12-03 DIAGNOSIS — R2681 Unsteadiness on feet: Secondary | ICD-10-CM | POA: Diagnosis not present

## 2022-12-03 DIAGNOSIS — R202 Paresthesia of skin: Secondary | ICD-10-CM | POA: Diagnosis not present

## 2022-12-03 DIAGNOSIS — R262 Difficulty in walking, not elsewhere classified: Secondary | ICD-10-CM | POA: Diagnosis not present

## 2022-12-03 NOTE — Therapy (Signed)
OUTPATIENT PHYSICAL THERAPY NEURO TREATMENT   Patient Name: Debra Trevino MRN: [id number] DOB:January 07, 1956, 67 y.o., female Today's Date: 12/03/2022   PCP: Kristian Covey, MD REFERRING PROVIDER: Glendale Chard, DO  END OF SESSION:  PT End of Session - 12/03/22 0852     Visit Number 2    Number of Visits 4    Date for PT Re-Evaluation 02/06/23    Authorization Type Medicare/Medicaid    PT Start Time 0850    PT Stop Time 0930    PT Time Calculation (min) 40 min             Past Medical History:  Diagnosis Date   Bilateral foot pain    morton neuroma, seeing podiatrist   CTS (carpal tunnel syndrome)    has seen hand specilist   Depression 03/24/2007   Qualifier: Diagnosis of  By: Lovell Sheehan MD, Balinda Quails    Gastric ulcer    per patient   GERD (gastroesophageal reflux disease) 05/27/2014   Sees Dr. Kinnie Scales; hx erosive esophagitis   Hot flashes    recur when stops HRT   HTN (hypertension)    Low back pain    chronic   Migraines    resolved on HRT for hot flases   Muscle spasm    very active, muscle spasm after tennis matches   Past Surgical History:  Procedure Laterality Date   CARPAL TUNNEL RELEASE     right hand   MEDIAL PARTIAL KNEE REPLACEMENT  2017   Right knee   REVISION TOTAL KNEE ARTHROPLASTY Left    02/2021   TONSILLECTOMY     Patient Active Problem List   Diagnosis Date Noted   Ataxia 04/21/2021   COPD (chronic obstructive pulmonary disease) (HCC) 04/21/2021   Tobacco dependence 04/21/2021   Meningioma (HCC) 04/21/2021   Encounter for screening for lung cancer 08/26/2019   Osteoarthritis, knee 03/07/2017   Family history of premature CAD 03/07/2017   Atypical chest pain 11/25/2014   Exertional shortness of breath 11/25/2014   GERD (gastroesophageal reflux disease) 05/27/2014   Essential hypertension 05/20/2007   Depression 03/24/2007   SYMPTOMATIC MENOPAUSAL/FEMALE CLIMACTERIC STATES 03/24/2007   ALLERGIC RHINITIS 09/30/2006    ONSET  DATE: several years  REFERRING DIAG: R20.2 (ICD-10-CM) - Paresthesia of hand, bilateral M54.12 (ICD-10-CM) - Cervical radiculopathy  THERAPY DIAG:  Unsteadiness on feet  Difficulty in walking, not elsewhere classified  Rationale for Evaluation and Treatment: Rehabilitation  SUBJECTIVE:  SUBJECTIVE STATEMENT: Noting that she is having some pain instep of foot  Pt accompanied by: self  PERTINENT HISTORY: GERD, depression, GERD, hypertension, migraines, alcohol use, and tobacco use  PAIN:  Are you having pain? Yes: NPRS scale: 2/10 Pain location: lower lumber, midline Pain description: aching Aggravating factors: carrying items, playing tennis Relieving factors: meloxicam, gabapentin  PRECAUTIONS: None  RED FLAGS: None   WEIGHT BEARING RESTRICTIONS: No  FALLS: Has patient fallen in last 6 months? Yes. Number of falls 1, reports tripping with possible concussion  LIVING ENVIRONMENT: Lives with: lives with their spouse Lives in: House/apartment Stairs: inside/outside Has following equipment at home: None  PLOF: Independent  PATIENT GOALS: improve symptoms and balance  OBJECTIVE:   TODAY'S TREATMENT: 12/03/22 Activity Comments  Corner balance  Feet together: EC. Head turns EO/EC 3x. Tandem position. Multi-sensory balance activities  Gastroc stretch                 PATIENT EDUCATION: Education details: discussion of assessment findings Person educated: Patient Education method: Explanation Education comprehension: verbalized understanding  HOME EXERCISE PROGRAM: Access Code: [email or web address] URL: [email or web address] Date: 12/03/2022 Prepared by: Shary Decamp  Exercises - Corner Balance Feet Together With Eyes Closed  - 1 x daily - 7 x weekly - 3 sets - 30 sec  hold - Corner Balance Feet Together: Eyes Closed With Head Turns  - 1 x daily - 7 x weekly - 3 sets - 3 reps - Semi-Tandem Corner Balance With Eyes Open  - 1 x daily - 7 x weekly - 3 sets - 30 sec hold - Semi-Tandem Corner Balance With Eyes Closed  - 1 x daily - 7 x weekly - 3 sets - 30 sec hold - Semi-Tandem Corner Balance: Eyes Open With Head Turns  - 1 x daily - 7 x weekly - 3 sets - 30 sec hold  DIAGNOSTIC FINDINGS:  MRI cervical spine wwo contrast 04/21/2021: 1. No acute findings are identified within the cervical spine. Findings are similar to previous MRI from 7 months prior. 2. No evidence of myelopathy or abnormal intradural enhancement. 3. Spondylosis as described, greatest at C5-6 and C6-7. At C5-6, there is moderate to severe foraminal narrowing on the left with possible left C6 nerve root encroachment.   MRI brain wo contrast 04/21/2021: 1. No acute intracranial abnormality. 2. 11 mm meningioma at the posterior right parieto-occipital convexity without associated edema or mass effect. 3. Otherwise normal brain MRI for age.  COGNITION: Overall cognitive status: Within functional limits for tasks assessed   SENSATION: No 2-point discrimination appreciated bilat feet to mid-calf and distal hands/arms  COORDINATION: WFL  EDEMA:    MUSCLE TONE: NT  MUSCLE LENGTH:   DTRs:  NT  POSTURE: No Significant postural limitations  LOWER EXTREMITY ROM:     Active  Right Eval Left Eval  Hip flexion    Hip extension    Hip abduction    Hip adduction    Hip internal rotation    Hip external rotation    Knee flexion 120 120  Knee extension 0 0  Ankle dorsiflexion 15 15  Ankle plantarflexion    Ankle inversion    Ankle eversion     (Blank rows = not tested)  LOWER EXTREMITY MMT:    BLE grossly 5/5  BED MOBILITY:  indep  TRANSFERS: indep    CURB:  Level of Assistance: Complete Independence Assistive device utilized: None Curb Comments:    STAIRS: Level of Assistance: Complete Independence Stair  Negotiation Technique: Alternating Pattern  with No Rails Number of Stairs: 12  Height of Stairs: 4-6  Comments:   GAIT: Gait pattern: poor foot clearance- Left Distance walked:  Assistive device utilized: None Level of assistance: Complete Independence Comments: decreased velocity: 2.4 ft/sec  FUNCTIONAL TESTS:  5 times sit to stand: NT Functional gait assessment: 23/30   M-CTSIB  Condition 1: Firm Surface, EO 30 Sec, Normal Sway  Condition 2: Firm Surface, EC 30 Sec, Normal Sway  Condition 3: Foam Surface, EO 30 Sec, Mild Sway  Condition 4: Foam Surface, EC 10 Sec, Moderate Sway        GOALS: Goals reviewed with patient? Yes  SHORT TERM GOALS: Target date: same as LTG    LONG TERM GOALS: Target date: 02/06/2023    Patient will be independent in HEP to improve functional outcomes Baseline:  Goal status: INITIAL  2.  Demo improved postural stability per mild sway condition 4 M-CTSIB to improve safety with ADL Baseline: 10 sec moderate Goal status: INITIAL  3.  Reduce risk for falls per score 28/30 Functional Gait Assessment Baseline: 23/30 Goal status: INITIAL    ASSESSMENT:  CLINICAL IMPRESSION: Returns to clinic with report of left medial foot discomfort. No tenderness to palpation or resistance. Instructed in multi-sensory balance activities for sensory re-weighting.  Good tolerance to activities and will resume when returns from vacation  OBJECTIVE IMPAIRMENTS: Abnormal gait, decreased balance, and impaired sensation.   ACTIVITY LIMITATIONS: carrying, stairs, and locomotion level  PARTICIPATION LIMITATIONS: community activity, yard work, and exercise activities  PERSONAL FACTORS: Time since onset of injury/illness/exacerbation are also affecting patient's functional outcome.   REHAB POTENTIAL: Good  CLINICAL DECISION MAKING: Stable/uncomplicated  EVALUATION COMPLEXITY:  Low  PLAN:  PT FREQUENCY: 1x/week  PT DURATION: 4 weeks  PLANNED INTERVENTIONS: Therapeutic exercises, Therapeutic activity, Neuromuscular re-education, Balance training, Gait training, Patient/Family education, Self Care, Joint mobilization, Stair training, Vestibular training, Canalith repositioning, Orthotic/Fit training, DME instructions, Aquatic Therapy, Spinal mobilization, and Manual therapy  PLAN FOR NEXT SESSION: corner balance activities for proprioception   8:52 AM, 12/03/22 M. Shary Decamp, PT, DPT Physical Therapist- [place] Office Number: [phone]

## 2022-12-10 ENCOUNTER — Other Ambulatory Visit: Payer: Self-pay

## 2022-12-10 DIAGNOSIS — Z122 Encounter for screening for malignant neoplasm of respiratory organs: Secondary | ICD-10-CM

## 2022-12-10 DIAGNOSIS — Z87891 Personal history of nicotine dependence: Secondary | ICD-10-CM

## 2022-12-10 DIAGNOSIS — F17210 Nicotine dependence, cigarettes, uncomplicated: Secondary | ICD-10-CM

## 2022-12-10 NOTE — Telephone Encounter (Signed)
I called and spoke with patient. She is currently out of the country, she will call and schedule when she returns.

## 2022-12-21 ENCOUNTER — Telehealth: Payer: Self-pay

## 2022-12-21 ENCOUNTER — Encounter: Payer: Self-pay | Admitting: Family Medicine

## 2022-12-21 ENCOUNTER — Ambulatory Visit: Payer: Medicare Other

## 2022-12-21 NOTE — Telephone Encounter (Signed)
Pt ready for scheduling at any time.  Estimated out-of-pocket cost due at time of visit: $0  Primary Insurance: Medicare [employer] co-insurance: 0%  Deductible: $240 out of $240  This summary of benefits is an estimation of the patient's out-of-pocket cost. Exact cost may vary based on individual plan coverage.

## 2023-01-15 ENCOUNTER — Ambulatory Visit: Payer: Medicare Other | Admitting: Family Medicine

## 2023-01-15 ENCOUNTER — Encounter: Payer: Self-pay | Admitting: Family Medicine

## 2023-01-15 VITALS — BP 110/68 | HR 64 | Temp 98.1°F | Wt 150.0 lb

## 2023-01-15 DIAGNOSIS — N390 Urinary tract infection, site not specified: Secondary | ICD-10-CM | POA: Diagnosis not present

## 2023-01-15 DIAGNOSIS — M810 Age-related osteoporosis without current pathological fracture: Secondary | ICD-10-CM

## 2023-01-15 LAB — POC URINALSYSI DIPSTICK (AUTOMATED)
Glucose, UA: NEGATIVE
Ketones, UA: NEGATIVE
Protein, UA: POSITIVE — AB
Spec Grav, UA: 1.015 (ref 1.010–1.025)
Urobilinogen, UA: 1 U/dL
pH, UA: 7 (ref 5.0–8.0)

## 2023-01-15 MED ORDER — CYCLOBENZAPRINE HCL 10 MG PO TABS: 10.0000 mg | ORAL_TABLET | Freq: Three times a day (TID) | ORAL | 0 refills | Status: DC | PRN

## 2023-01-15 MED ORDER — CIPROFLOXACIN HCL 500 MG PO TABS: 500.0000 mg | ORAL_TABLET | Freq: Two times a day (BID) | ORAL | 0 refills | Status: DC

## 2023-01-15 MED ORDER — DENOSUMAB 60 MG/ML ~~LOC~~ SOSY
60.0000 mg | PREFILLED_SYRINGE | Freq: Once | SUBCUTANEOUS | Status: AC
Start: 2023-01-15 — End: 2023-01-15
  Administered 2023-01-15: 60 mg via SUBCUTANEOUS

## 2023-01-15 NOTE — Addendum Note (Signed)
Addended by: Carola Rhine on: 01/15/2023 11:05 AM   Modules accepted: Orders

## 2023-01-15 NOTE — Progress Notes (Signed)
   Subjective:    Patient ID: Debra Trevino, female    DOB: 20-Nov-1955, 67 y.o.   MRN: [id number]  HPI Here for 4 days of urinary burning and urgency. No back pain or fever.    Review of Systems  Constitutional: Negative.   Respiratory: Negative.    Cardiovascular: Negative.   Genitourinary:  Positive for dysuria, frequency and urgency. Negative for flank pain and hematuria.       Objective:   Physical Exam Constitutional:      Appearance: Normal appearance. She is not ill-appearing.  Cardiovascular:     Rate and Rhythm: Normal rate and regular rhythm.     Pulses: Normal pulses.     Heart sounds: Normal heart sounds.  Pulmonary:     Effort: Pulmonary effort is normal.     Breath sounds: Normal breath sounds.  Abdominal:     Tenderness: There is no right CVA tenderness or left CVA tenderness.  Neurological:     Mental Status: She is alert.           Assessment & Plan:  UTI, treat with 7 days of Cipro. Culture the sample. Drink plenty of water.  Gershon Crane, MD

## 2023-01-15 NOTE — Addendum Note (Signed)
Addended by: Carola Rhine on: 01/15/2023 10:46 AM   Modules accepted: Orders

## 2023-01-17 LAB — URINE CULTURE
MICRO NUMBER:: 15688474
SPECIMEN QUALITY:: ADEQUATE

## 2023-01-22 DIAGNOSIS — M79672 Pain in left foot: Secondary | ICD-10-CM | POA: Diagnosis not present

## 2023-02-04 ENCOUNTER — Encounter: Payer: Self-pay | Admitting: Family Medicine

## 2023-02-04 ENCOUNTER — Ambulatory Visit (INDEPENDENT_AMBULATORY_CARE_PROVIDER_SITE_OTHER): Payer: Medicare Other | Admitting: Family Medicine

## 2023-02-04 VITALS — BP 98/70 | HR 70 | Temp 97.5°F | Ht 62.0 in | Wt 150.0 lb

## 2023-02-04 DIAGNOSIS — F339 Major depressive disorder, recurrent, unspecified: Secondary | ICD-10-CM

## 2023-02-04 MED ORDER — BUPROPION HCL ER (XL) 150 MG PO TB24: 150.0000 mg | ORAL_TABLET | Freq: Every day | ORAL | 1 refills | Status: DC

## 2023-02-04 NOTE — Progress Notes (Signed)
Established Patient Office Visit  Subjective   Patient ID: Debra Trevino, female    DOB: 06-Feb-1956  Age: 67 y.o. MRN: [id number]  Chief Complaint  Patient presents with   Chest Pain   Medication Consultation    HPI   Jammie is here to consult regarding medications for depression.  She has been on Celexa 20 mg daily for quite some time.  She states she has had some issues with low motivation and low energy.  Still feels depressed at times.  No suicidal ideation.  She has been taking Celexa consistently.  She took escitalopram for some time but had bad dreams with that.  She is interested in looking at potential add-on therapy or potential alternative therapies.  Enjoys playing tennis but recently has lost her drive to do those kinds of activities.  Has had some anhedonia.  Past Medical History:  Diagnosis Date   Bilateral foot pain    morton neuroma, seeing podiatrist   CTS (carpal tunnel syndrome)    has seen hand specilist   Depression 03/24/2007   Qualifier: Diagnosis of  By: Lovell Sheehan MD, Balinda Quails    Gastric ulcer    per patient   GERD (gastroesophageal reflux disease) 05/27/2014   Sees Dr. Kinnie Scales; hx erosive esophagitis   Hot flashes    recur when stops HRT   HTN (hypertension)    Low back pain    chronic   Migraines    resolved on HRT for hot flases   Muscle spasm    very active, muscle spasm after tennis matches   Past Surgical History:  Procedure Laterality Date   CARPAL TUNNEL RELEASE     right hand   MEDIAL PARTIAL KNEE REPLACEMENT  2017   Right knee   REVISION TOTAL KNEE ARTHROPLASTY Left    02/2021   TONSILLECTOMY      reports that she has been smoking cigarettes. She has a 50 pack-year smoking history. She has never used smokeless tobacco. She reports current alcohol use. She reports that she does not use drugs. family history includes Charcot-Marie-Tooth disease in her father; Hypertension in an other family member; Renal cancer in her mother;  Schizophrenia in her brother. Allergies  Allergen Reactions   Penicillins Swelling    Has patient had a PCN reaction causing immediate rash, facial/tongue/throat swelling, SOB or lightheadedness with hypotension:YES Has patient had a PCN reaction causing severe rash involving mucus membranes or skin necrosis: NO Has patient had a PCN reaction that required hospitalization NO Has patient had a PCN reaction occurring within the last 10 years: NO If all of the above answers are "NO", then may proceed with Cephalosporin use.   Tape Rash    Pt is not sure if paper tape also causes the rash. Reaction was to surgical tape    Review of Systems  Constitutional:  Negative for weight loss.  Psychiatric/Behavioral:  Positive for depression. Negative for memory loss and suicidal ideas.       Objective:     BP 98/70 (BP Location: Left Arm, Patient Position: Sitting, Cuff Size: Normal)   Pulse 70   Temp (!) 97.5 F (36.4 C) (Oral)   Ht 5\' 2"  (1.575 m)   Wt 150 lb (68 kg)   SpO2 98%   BMI 27.44 kg/m  BP Readings from Last 3 Encounters:  02/04/23 98/70  01/15/23 110/68  11/21/22 107/68   Wt Readings from Last 3 Encounters:  02/04/23 150 lb (68 kg)  01/15/23 150  lb (68 kg)  11/21/22 152 lb (68.9 kg)      Physical Exam Vitals reviewed.  Constitutional:      General: She is not in acute distress.    Appearance: She is not ill-appearing.  Cardiovascular:     Rate and Rhythm: Normal rate and regular rhythm.  Pulmonary:     Effort: Pulmonary effort is normal. No respiratory distress.     Breath sounds: Normal breath sounds.  Neurological:     Mental Status: She is alert.  Psychiatric:        Mood and Affect: Mood normal. Mood is not anxious.        Behavior: Behavior normal.      No results found for any visits on 02/04/23.    The ASCVD Risk score (Arnett DK, et al., 2019) failed to calculate for the following reasons:   Cannot find a previous HDL lab   Cannot find a  previous total cholesterol lab    Assessment & Plan:   History of recurrent depression.  Currently on citalopram 20 mg daily.  Has symptoms above including low motivation, decreased energy, decreased focus.  We discussed adding Wellbutrin XL 150 mg once daily to her citalopram hopefully to help with the above issues.  Reviewed potential side effects.  Set up office follow-up in about 5 to 6 weeks to reassess.  Evelena Peat, MD

## 2023-02-12 DIAGNOSIS — M79672 Pain in left foot: Secondary | ICD-10-CM | POA: Diagnosis not present

## 2023-02-18 ENCOUNTER — Other Ambulatory Visit: Payer: Self-pay | Admitting: Family Medicine

## 2023-02-26 ENCOUNTER — Other Ambulatory Visit: Payer: Self-pay | Admitting: Family Medicine

## 2023-02-27 ENCOUNTER — Other Ambulatory Visit: Payer: Self-pay | Admitting: Family Medicine

## 2023-03-19 ENCOUNTER — Ambulatory Visit: Payer: Medicare Other | Admitting: Family Medicine

## 2023-03-23 ENCOUNTER — Other Ambulatory Visit: Payer: Self-pay | Admitting: Family Medicine

## 2023-03-26 ENCOUNTER — Ambulatory Visit (INDEPENDENT_AMBULATORY_CARE_PROVIDER_SITE_OTHER): Payer: Medicare Other | Admitting: Neurology

## 2023-03-26 ENCOUNTER — Encounter: Payer: Self-pay | Admitting: Neurology

## 2023-03-26 VITALS — BP 117/75 | HR 78 | Ht 63.0 in | Wt 151.0 lb

## 2023-03-26 DIAGNOSIS — R200 Anesthesia of skin: Secondary | ICD-10-CM

## 2023-03-26 DIAGNOSIS — R202 Paresthesia of skin: Secondary | ICD-10-CM

## 2023-03-26 NOTE — Patient Instructions (Addendum)
 Nerve testing of the left hand  If you would like to have skin biopsy to check for small fiber neuropathy, please contact my office.   ELECTROMYOGRAM AND NERVE CONDUCTION STUDIES (EMG/NCS) INSTRUCTIONS  How to Prepare The neurologist conducting the EMG will need to know if you have certain medical conditions. Tell the neurologist and other EMG lab personnel if you: Have a pacemaker or any other electrical medical device Take blood-thinning medications Have hemophilia, a blood-clotting disorder that causes prolonged bleeding Bathing Take a shower or bath shortly before your exam in order to remove oils from your skin. Don't apply lotions or creams before the exam.  What to Expect You'll likely be asked to change into a hospital gown for the procedure and lie down on an examination table. The following explanations can help you understand what will happen during the exam.  Electrodes. The neurologist or a technician places surface electrodes at various locations on your skin depending on where you're experiencing symptoms. Or the neurologist may insert needle electrodes at different sites depending on your symptoms.  Sensations. The electrodes will at times transmit a tiny electrical current that you may feel as a twinge or spasm. The needle electrode may cause discomfort or pain that usually ends shortly after the needle is removed. If you are concerned about discomfort or pain, you may want to talk to the neurologist about taking a short break during the exam.  Instructions. During the needle EMG, the neurologist will assess whether there is any spontaneous electrical activity when the muscle is at rest - activity that isn't present in healthy muscle tissue - and the degree of activity when you slightly contract the muscle.  He or she will give you instructions on resting and contracting a muscle at appropriate times. Depending on what muscles and nerves the neurologist is examining, he or she may  ask you to change positions during the exam.  After your EMG You may experience some temporary, minor bruising where the needle electrode was inserted into your muscle. This bruising should fade within several days. If it persists, contact your primary care doctor.

## 2023-03-26 NOTE — Progress Notes (Signed)
 Follow-up Visit   Date: 03/26/2023    Debra Trevino MRN: [id number] DOB: 06-Oct-1955    Debra Trevino is a 68 y.o. left-handed Caucasian female with GERD, depression, GERD, hypertension, migraines, alcohol use, and tobacco use returning to the clinic for follow-up of numbness/tingling.  The patient was accompanied to the clinic by self.  IMPRESSION/PLAN: Left hand paresthesias, ?carpal tunnel syndrome vs cervical radiculopathy  - NCS/EMG of the left hand  2.   Bilateral feet numbness.  No evidence of large fiber neuropathy on NCS/EMG.  She had a family history of CMT.  Her exam does not have features of CMT or signs of neuropathy.   - Skin biopsy declined  Return to clinic in 6 months.  --------------------------------------------- UPDATE 11/21/2022:  She has numbness/tingling involving the soles feet since around 2020.  Symptoms are constant.  Symptoms are slightly relieved by topical ointment.  Balance is not good and feels wobbly.  She has family history of CMT in her father and maternal uncle.  She also has lumbar canal stenosis and was previously suggested surgery, however symptoms improved with conservative therapies.  She was seen by Dr. Onetha.  UPDATE 03/26/2023:  She is here for follow-up.  She continues to have numbness/tingling in the feet and has some relief with gabapentin  100mg  at bedtime.  She has numbness/tingling in the left, which has become constant. She is dropping items and occasionally wakes up from her hand falling asleep.  She had CTS release in the right by Dr. Camella.     Medications:  Current Outpatient Medications on File Prior to Visit  Medication Sig Dispense Refill   Acetaminophen  (TYLENOL  PO) Take 1 tablet by mouth daily as needed (prior to physical therapy).     bisoprolol -hydrochlorothiazide  (ZIAC ) 2.5-6.25 MG tablet TAKE 1 TABLET BY MOUTH EVERY DAY 90 tablet 1   buPROPion  (WELLBUTRIN  XL) 150 MG 24 hr tablet TAKE 1 TABLET BY MOUTH EVERY  DAY 30 tablet 0   citalopram  (CELEXA ) 20 MG tablet TAKE 1 TABLET BY MOUTH EVERY DAY 90 tablet 1   cyclobenzaprine  (FLEXERIL ) 10 MG tablet TAKE 1 TABLET BY MOUTH THREE TIMES A DAY AS NEEDED FOR MUSCLE SPASMS 90 tablet 0   diclofenac Sodium (VOLTAREN) 1 % GEL Apply 1 application topically 2 (two) times daily as needed (pain).     gabapentin  (NEURONTIN ) 100 MG capsule Take 100 mg by mouth at bedtime.      meloxicam  (MOBIC ) 15 MG tablet Take 15 mg by mouth every morning.     omeprazole  (PRILOSEC) 20 MG capsule Take 1 capsule (20 mg total) by mouth every morning. 90 capsule 3   ciprofloxacin  (CIPRO ) 500 MG tablet Take 1 tablet (500 mg total) by mouth 2 (two) times daily. (Patient not taking: Reported on 03/26/2023) 14 tablet 0   No current facility-administered medications on file prior to visit.    Allergies:  Allergies  Allergen Reactions   Penicillins Swelling    Has patient had a PCN reaction causing immediate rash, facial/tongue/throat swelling, SOB or lightheadedness with hypotension:YES Has patient had a PCN reaction causing severe rash involving mucus membranes or skin necrosis: NO Has patient had a PCN reaction that required hospitalization NO Has patient had a PCN reaction occurring within the last 10 years: NO If all of the above answers are NO, then may proceed with Cephalosporin use.   Tape Rash    Pt is not sure if paper tape also causes the rash. Reaction was to surgical  tape    Vital Signs:  BP 117/75   Pulse 78   Ht 5' 3 (1.6 m)   Wt 151 lb (68.5 kg)   SpO2 96%   BMI 26.75 kg/m    Neurological Exam: MENTAL STATUS including orientation to time, place, person, recent and remote memory, attention span and concentration, language, and fund of knowledge is normal.  Speech is not dysarthric.  CRANIAL NERVES:   Pupils equal round and reactive to light.  Normal conjugate, extra-ocular eye movements in all directions of gaze.  Mild right ptosis.  Face is symmetric.  MOTOR:   Motor strength is 5/5 in all extremities, including distally in the hands and feet.  No atrophy, fasciculations or abnormal movements.  No pronator drift.  Tone is normal.    MSRs:  Reflexes are 2+/4 throughout and 3+/4 bilateral patella.  SENSORY:  Intact to vibration and temperature throughout.  COORDINATION/GAIT:  Normal finger-to- nose-finger.  Intact rapid alternating movements bilaterally.  Gait narrow based and stable.  Stressed and tandem gait intact.  Data: NCS/EMG of the right arm and leg 11/23/2022: Chronic C5-6 radiculopathy affecting the right upper extremity, mild. There is no evidence of a large fiber sensorimotor polyneuropathy, lumbosacral radiculopathy, or carpal tunnel syndrome affecting the right side.  MRI cervical spine wwo contrast 04/21/2021: 1. No acute findings are identified within the cervical spine. Findings are similar to previous MRI from 7 months prior. 2. No evidence of myelopathy or abnormal intradural enhancement. 3. Spondylosis as described, greatest at C5-6 and C6-7. At C5-6, there is moderate to severe foraminal narrowing on the left with possible left C6 nerve root encroachment.  MRI brain wo contrast 04/21/2021: 1. No acute intracranial abnormality. 2. 11 mm meningioma at the posterior right parieto-occipital convexity without associated edema or mass effect. 3. Otherwise normal brain MRI for age.    Thank you for allowing me to participate in patient's care.  If I can answer any additional questions, I would be pleased to do so.    Sincerely,    Jaunice Mirza K. Tobie, DO  :

## 2023-03-27 DIAGNOSIS — M25572 Pain in left ankle and joints of left foot: Secondary | ICD-10-CM | POA: Diagnosis not present

## 2023-04-03 DIAGNOSIS — M25572 Pain in left ankle and joints of left foot: Secondary | ICD-10-CM | POA: Diagnosis not present

## 2023-04-05 ENCOUNTER — Ambulatory Visit (HOSPITAL_BASED_OUTPATIENT_CLINIC_OR_DEPARTMENT_OTHER): Payer: Medicare Other | Admitting: Cardiology

## 2023-04-10 DIAGNOSIS — M25572 Pain in left ankle and joints of left foot: Secondary | ICD-10-CM | POA: Diagnosis not present

## 2023-04-19 DIAGNOSIS — M25572 Pain in left ankle and joints of left foot: Secondary | ICD-10-CM | POA: Diagnosis not present

## 2023-04-23 DIAGNOSIS — M25572 Pain in left ankle and joints of left foot: Secondary | ICD-10-CM | POA: Diagnosis not present

## 2023-04-26 DIAGNOSIS — M25572 Pain in left ankle and joints of left foot: Secondary | ICD-10-CM | POA: Diagnosis not present

## 2023-04-27 ENCOUNTER — Other Ambulatory Visit: Payer: Self-pay | Admitting: Family Medicine

## 2023-04-30 DIAGNOSIS — M25572 Pain in left ankle and joints of left foot: Secondary | ICD-10-CM | POA: Diagnosis not present

## 2023-05-02 ENCOUNTER — Encounter: Payer: Medicare Other | Admitting: Neurology

## 2023-05-03 DIAGNOSIS — M25572 Pain in left ankle and joints of left foot: Secondary | ICD-10-CM | POA: Diagnosis not present

## 2023-05-07 DIAGNOSIS — M25572 Pain in left ankle and joints of left foot: Secondary | ICD-10-CM | POA: Diagnosis not present

## 2023-05-10 DIAGNOSIS — M25572 Pain in left ankle and joints of left foot: Secondary | ICD-10-CM | POA: Diagnosis not present

## 2023-05-14 DIAGNOSIS — M25572 Pain in left ankle and joints of left foot: Secondary | ICD-10-CM | POA: Diagnosis not present

## 2023-05-17 ENCOUNTER — Ambulatory Visit (INDEPENDENT_AMBULATORY_CARE_PROVIDER_SITE_OTHER): Payer: Medicare Other | Admitting: Neurology

## 2023-05-17 DIAGNOSIS — R202 Paresthesia of skin: Secondary | ICD-10-CM | POA: Diagnosis not present

## 2023-05-17 DIAGNOSIS — G5602 Carpal tunnel syndrome, left upper limb: Secondary | ICD-10-CM

## 2023-05-17 DIAGNOSIS — R200 Anesthesia of skin: Secondary | ICD-10-CM | POA: Diagnosis not present

## 2023-05-17 NOTE — Therapy (Signed)
 [facility] [address] [place], [place], [address] Phone: [phone]   Fax:  [phone]  Patient Details  Name: Debra Trevino MRN: [id number] Date of Birth: 1956/02/25 Referring Provider:  No ref. provider found  Encounter Date: 05/17/2023  PHYSICAL THERAPY DISCHARGE SUMMARY  Visits from Start of Care: 2  Current functional level related to goals / functional outcomes: unknown   Remaining deficits: unknown   Education / Equipment: HEP initiated   Patient agrees to discharge. Patient goals were not met. Patient is being discharged due to not returning since the last visit.  Dion Body, PT 05/17/2023, 10:15 AM  [facility] [address] [place], [place], [address] Phone: [phone]   Fax:  [phone]

## 2023-05-17 NOTE — Procedures (Signed)
  [facility] Neurology  [address] [place], [address]  [place], [place] [address] Tel: [phone] Fax: [phone] Test Date:  05/17/2023  Patient: Elan Mcelvain DOB: February 09, 1956 Physician: Nita Sickle, DO  Sex: Female Height: 5\' 3"  Ref Phys: Nita Sickle, DO  ID#: [id number]   Technician:    History: This is a 68 year old female referred for evaluation of left hand paresthesias.  NCV & EMG Findings: Extensive electrodiagnostic testing of the left upper extremity shows:  Left median sensory response shows prolonged latencycy (4.1 ms).  Left ulnar sensory response is within normal limits.   Left median motor response shows prolonged latency (4.1 ms).  Of note, there is a left Martin-Gruber anastomosis, a normal anatomic variant.  Left ulnar motor response is within normal limits.  There is no evidence of active or chronic motor axonal loss changes affecting any of the tested muscles.  Motor unit configuration and recruitment pattern is within normal limits.    Impression: Left median neuropathy at or distal to the wrist, consistent with a clinical diagnosis of carpal tunnel syndrome.  Overall, these findings are moderate in degree electrically.   ___________________________ Nita Sickle, DO    Nerve Conduction Studies   Stim Site NR Peak (ms) Norm Peak (ms) O-P Amp (V) Norm O-P Amp  Left Median Anti Sensory (2nd Digit)  32 C  Wrist    *4.1 <3.8 15.6 >10  Left Ulnar Anti Sensory (5th Digit)  32 C  Wrist    2.6 <3.2 24.4 >5     Stim Site NR Onset (ms) Norm Onset (ms) O-P Amp (mV) Norm O-P Amp Site1 Site2 Delta-0 (ms) Dist (cm) Vel (m/s) Norm Vel (m/s)  Left Median Motor (Abd Poll Brev)  32 C  Wrist    *4.1 <4.0 5.1 >5 Elbow Wrist 5.2 28.0 54 >50  Elbow    9.3  4.8  Ulnar-wrist crossover Elbow 5.3 0.0    Ulnar-wrist crossover    4.0  5.4         Left Ulnar Motor (Abd Dig Minimi)  32 C  Wrist    2.3 <3.1 7.4 >7 B Elbow Wrist 3.2 20.0 63 >50  B Elbow    5.5  6.4  A Elbow B  Elbow 1.8 10.0 56 >50  A Elbow    7.3  6.4          Electromyography   Side Muscle Ins.Act Fibs Fasc Recrt Amp Dur Poly Activation Comment  Left 1stDorInt Nml Nml Nml Nml Nml Nml Nml Nml N/A  Left Abd Poll Brev Nml Nml Nml Nml Nml Nml Nml Nml N/A  Left PronatorTeres Nml Nml Nml Nml Nml Nml Nml Nml N/A  Left Biceps Nml Nml Nml Nml Nml Nml Nml Nml N/A  Left Triceps Nml Nml Nml Nml Nml Nml Nml Nml N/A  Left Deltoid Nml Nml Nml Nml Nml Nml Nml Nml N/A      Waveforms:

## 2023-05-23 ENCOUNTER — Encounter (INDEPENDENT_AMBULATORY_CARE_PROVIDER_SITE_OTHER): Payer: Medicare Other | Admitting: Family Medicine

## 2023-05-23 NOTE — Progress Notes (Signed)
 Error

## 2023-05-24 DIAGNOSIS — M25572 Pain in left ankle and joints of left foot: Secondary | ICD-10-CM | POA: Diagnosis not present

## 2023-05-27 DIAGNOSIS — M25572 Pain in left ankle and joints of left foot: Secondary | ICD-10-CM | POA: Diagnosis not present

## 2023-05-28 ENCOUNTER — Other Ambulatory Visit: Payer: Self-pay | Admitting: Family Medicine

## 2023-05-28 NOTE — Progress Notes (Unsigned)
  Cardiology Office Note:  .   Date:  05/28/2023  ID:  Debra Trevino, DOB June 30, 1955, MRN [id number] PCP: Debra Covey, MD  [facility] Providers Cardiologist:  Debra Red, MD { Click to update primary MD,subspecialty MD or APP then REFRESH:1}   History of Present Illness: .   Debra Trevino is a 68 y.o. female hx of HTN, COPD, referral for CP. She saw Dr. Tresa Trevino for HTN in 2016 for which he managed with bisporolol/hydrochlorothiazide. Bps were well controlled. She had a faint murmur. Echo in 2016 showed normal LV fxn, no significant valve abn. Likely chiari network. She had a CAC score in 2020 showing 0 CAC.  She now requested a visit 2/2 chest tightness.  ROS:  per HPI otherwise negative   Studies Reviewed: .        *** Risk Assessment/Calculations:   {Does this patient have ATRIAL FIBRILLATION?:[id number]} No BP recorded.  {Refresh Note OR Click here to enter BP  :1}***       Physical Exam:   VS:  There were no vitals taken for this visit.   Wt Readings from Last 3 Encounters:  03/26/23 151 lb (68.5 kg)  02/04/23 150 lb (68 kg)  01/15/23 150 lb (68 kg)    GEN: Well nourished, well developed in no acute distress NECK: No JVD; No carotid bruits CARDIAC: ***RRR, no murmurs, rubs, gallops RESPIRATORY:  Clear to auscultation without rales, wheezing or rhonchi  ABDOMEN: Soft, non-tender, non-distended EXTREMITIES:  No edema; No deformity   ASSESSMENT AND PLAN: .   ***    {Are you ordering a CV Procedure (e.g. stress test, cath, DCCV, TEE, etc)?   Press F2        :[id number]}  Dispo: ***  Signed, Maisie Fus, MD

## 2023-05-30 ENCOUNTER — Ambulatory Visit: Attending: Internal Medicine | Admitting: Internal Medicine

## 2023-05-30 ENCOUNTER — Encounter: Payer: Self-pay | Admitting: Internal Medicine

## 2023-05-30 ENCOUNTER — Ambulatory Visit: Payer: Medicare Other | Admitting: Internal Medicine

## 2023-05-30 VITALS — BP 112/64 | HR 66 | Ht 63.0 in | Wt 153.0 lb

## 2023-05-30 DIAGNOSIS — I10 Essential (primary) hypertension: Secondary | ICD-10-CM | POA: Diagnosis not present

## 2023-05-30 DIAGNOSIS — R079 Chest pain, unspecified: Secondary | ICD-10-CM | POA: Insufficient documentation

## 2023-05-30 NOTE — Patient Instructions (Addendum)
 Medication Instructions:  Your physician recommends that you continue on your current medications as directed. Please refer to the Current Medication list given to you today.  *If you need a refill on your cardiac medications before your next appointment, please call your pharmacy*  Testing/Procedures: Dr. Wyline Mood has ordered a Lexiscan Myocardial Perfusion Imaging Study.  Please arrive 15 minutes prior to your appointment time for registration and insurance purposes.   The test will take approximately 3 to 4 hours to complete; you may bring reading material.  If someone comes with you to your appointment, they will need to remain in the main lobby due to limited space in the testing area. **If you are pregnant or breastfeeding, please notify the nuclear lab prior to your appointment**   How to prepare for your Myocardial Perfusion Test: Do not eat or drink 3 hours prior to your test, except you may have water. Do not consume products containing caffeine (regular or decaffeinated) 12 hours prior to your test. (ex: coffee, chocolate, sodas, tea). Do wear comfortable clothes (no dresses or overalls) and walking shoes, tennis shoes preferred (No heels or open toe shoes are allowed). Do NOT wear cologne, perfume, aftershave, or lotions (deodorant is allowed). If you use an inhaler, use it the AM of your test and bring it with you.  If you use a nebulizer, use it the AM of your test.  If these instructions are not followed, your test will have to be rescheduled.   Follow-Up: At [facility], you and your health needs are our priority.  As part of our continuing mission to provide you with exceptional heart care, we have created designated Provider Care Teams.  These Care Teams include your primary Cardiologist (physician) and Advanced Practice Providers (APPs -  Physician Assistants and Nurse Practitioners) who all work together to provide you with the care you need, when you need it.  We  recommend signing up for the patient portal called "MyChart".  Sign up information is provided on this After Visit Summary.  MyChart is used to connect with patients for Virtual Visits (Telemedicine).  Patients are able to view lab/test results, encounter notes, upcoming appointments, etc.  Non-urgent messages can be sent to your provider as well.   To learn more about what you can do with MyChart, go to [email or web address].    Your next appointment:   As needed  Provider:   Any APP  Other Instructions

## 2023-05-31 ENCOUNTER — Encounter (HOSPITAL_COMMUNITY): Payer: Self-pay

## 2023-06-13 ENCOUNTER — Encounter (HOSPITAL_COMMUNITY)

## 2023-06-13 ENCOUNTER — Ambulatory Visit (HOSPITAL_COMMUNITY)

## 2023-06-18 ENCOUNTER — Telehealth (HOSPITAL_COMMUNITY): Payer: Self-pay | Admitting: Internal Medicine

## 2023-06-18 NOTE — Telephone Encounter (Signed)
 Patient cancelled Myoview x 2 . See reason below:  06/13/2023 11:12 AM By: Daneil Dolin  Cancel Rsn: Patient (Patient sent a message through My Chart to have today's appointment cancelled.)   Order will be removed from the [facility]. If patient calls back to reschedule we will reinstate the order. Thank you.

## 2023-06-24 ENCOUNTER — Other Ambulatory Visit: Payer: Self-pay | Admitting: Family Medicine

## 2023-07-01 ENCOUNTER — Telehealth: Payer: Self-pay

## 2023-07-01 NOTE — Telephone Encounter (Signed)
 Pt ready for scheduling on or after 5/5  Estimated out-of-pocket cost due at time of visit: $0  Primary Insurance: Medicare  This summary of benefits is an estimation of the patient's out-of-pocket cost. Exact cost may vary based on individual plan coverage.

## 2023-07-04 NOTE — Telephone Encounter (Signed)
 Left a message for the patient to return my call.

## 2023-07-08 NOTE — Telephone Encounter (Signed)
 Left a message for the patient to return my call.

## 2023-07-10 ENCOUNTER — Ambulatory Visit: Admitting: Family Medicine

## 2023-07-11 NOTE — Telephone Encounter (Signed)
 Left a message for the patient to return my call.

## 2023-07-24 ENCOUNTER — Encounter: Payer: Self-pay | Admitting: Internal Medicine

## 2023-07-25 ENCOUNTER — Ambulatory Visit

## 2023-07-25 DIAGNOSIS — M810 Age-related osteoporosis without current pathological fracture: Secondary | ICD-10-CM | POA: Diagnosis not present

## 2023-07-25 MED ORDER — DENOSUMAB 60 MG/ML ~~LOC~~ SOSY
60.0000 mg | PREFILLED_SYRINGE | Freq: Once | SUBCUTANEOUS | Status: AC
  Administered 2023-07-25: 60 mg via SUBCUTANEOUS

## 2023-07-25 NOTE — Progress Notes (Signed)
 Per orders of Dr. Bambi Lever, injection of Prolia  60 mg/ml given by Markevius Trombetta L Riya Huxford. Patient tolerated injection well.

## 2023-08-13 ENCOUNTER — Ambulatory Visit (INDEPENDENT_AMBULATORY_CARE_PROVIDER_SITE_OTHER): Admitting: Family Medicine

## 2023-08-13 ENCOUNTER — Encounter: Payer: Self-pay | Admitting: Family Medicine

## 2023-08-13 VITALS — BP 120/70 | HR 80 | Temp 97.8°F | Resp 16 | Ht 63.0 in | Wt 146.4 lb

## 2023-08-13 DIAGNOSIS — J069 Acute upper respiratory infection, unspecified: Secondary | ICD-10-CM | POA: Diagnosis not present

## 2023-08-13 DIAGNOSIS — J029 Acute pharyngitis, unspecified: Secondary | ICD-10-CM

## 2023-08-13 DIAGNOSIS — R062 Wheezing: Secondary | ICD-10-CM

## 2023-08-13 DIAGNOSIS — R1084 Generalized abdominal pain: Secondary | ICD-10-CM

## 2023-08-13 LAB — POCT RAPID STREP A (OFFICE): Rapid Strep A Screen: NEGATIVE

## 2023-08-13 MED ORDER — ALBUTEROL SULFATE HFA 108 (90 BASE) MCG/ACT IN AERS: 2.0000 | INHALATION_SPRAY | Freq: Four times a day (QID) | RESPIRATORY_TRACT | 0 refills | Status: AC | PRN

## 2023-08-13 MED ORDER — PREDNISONE 20 MG PO TABS: 40.0000 mg | ORAL_TABLET | Freq: Every day | ORAL | 0 refills | Status: AC

## 2023-08-13 MED ORDER — HYDROCODONE BIT-HOMATROP MBR 5-1.5 MG/5ML PO SOLN: 5.0000 mL | Freq: Every evening | ORAL | 0 refills | Status: DC | PRN

## 2023-08-13 NOTE — Progress Notes (Signed)
 ACUTE VISIT Chief Complaint  Patient presents with   Sore Throat   Cough    Started last week, cough picked up on Friday/weekend   Nasal Congestion   HPI: Debra Trevino is a 68 y.o. female with a PMHx of  Depression, HTN, COPD, GERD, Allergic Rhinitis, Osteoarthritis (knee) who is here today complaining of sore throat, congestion, and cough.   Pt complaining of sore throat, nasal congestion, and cough which began last week.  Symptoms include subjective fever, chills, post-nasal drainage. Also reports wheezing which started last night, (she denies a h/o asthma or COPD,  has never used inhalers, she occasionally smokes). Cough This is a new problem. The current episode started in the past 7 days. The problem has been gradually improving. The problem occurs every few hours. The cough is Productive of sputum. Associated symptoms include nasal congestion, postnasal drip and a sore throat. Pertinent negatives include no chest pain, ear congestion, ear pain, heartburn, hemoptysis, myalgias, rash, shortness of breath, sweats or weight loss. Risk factors for lung disease include smoking/tobacco exposure. She has tried OTC cough suppressant for the symptoms. The treatment provided mild relief.   Also endorses having intermittent generalized stomach mild pains and frontal pressure headaches. Took a OTC cough medicine which has been helping with the headaches. Pertinent negatives include N/V/D, and fullness/pain in her ears.  Has not tested for COVID, but was around 1 person who had sinuitis last week.  States that her symptoms are improved today.  Review of Systems  Constitutional:  Positive for fatigue. Negative for appetite change, unexpected weight change and weight loss.  HENT:  Positive for congestion, postnasal drip and sore throat. Negative for ear pain.   Eyes:  Negative for discharge.  Respiratory:  Positive for cough. Negative for hemoptysis and shortness of breath.    Cardiovascular:  Negative for chest pain.  Gastrointestinal:  Negative for heartburn, nausea and vomiting.  Genitourinary:  Negative for decreased urine volume, dysuria and hematuria.  Musculoskeletal:  Negative for myalgias.  Skin:  Negative for rash.  See other pertinent positives and negatives in HPI.  Current Outpatient Medications on File Prior to Visit  Medication Sig Dispense Refill   Acetaminophen  (TYLENOL  PO) Take 1 tablet by mouth daily as needed (prior to physical therapy).     bisoprolol -hydrochlorothiazide  (ZIAC ) 2.5-6.25 MG tablet TAKE 1 TABLET BY MOUTH EVERY DAY 90 tablet 1   buPROPion  (WELLBUTRIN  XL) 150 MG 24 hr tablet TAKE 1 TABLET BY MOUTH EVERY DAY 90 tablet 0   citalopram  (CELEXA ) 20 MG tablet TAKE 1 TABLET BY MOUTH EVERY DAY 90 tablet 1   cyclobenzaprine  (FLEXERIL ) 10 MG tablet TAKE 1 TABLET BY MOUTH THREE TIMES A DAY AS NEEDED FOR MUSCLE SPASMS 90 tablet 0   diclofenac Sodium (VOLTAREN) 1 % GEL Apply 1 application  topically 2 (two) times daily as needed (pain).     gabapentin  (NEURONTIN ) 100 MG capsule Take 100 mg by mouth at bedtime.      meloxicam  (MOBIC ) 15 MG tablet Take 15 mg by mouth every morning.     omeprazole  (PRILOSEC) 20 MG capsule Take 1 capsule (20 mg total) by mouth every morning. 90 capsule 3   No current facility-administered medications on file prior to visit.   Past Medical History:  Diagnosis Date   Bilateral foot pain    morton neuroma, seeing podiatrist   CTS (carpal tunnel syndrome)    has seen hand specilist   Depression 03/24/2007   Qualifier: Diagnosis  of  By: Larrie Po MD, Wilmon Hashimoto    Gastric ulcer    per patient   GERD (gastroesophageal reflux disease) 05/27/2014   Sees Dr. Andriette Keeling; hx erosive esophagitis   Hot flashes    recur when stops HRT   HTN (hypertension)    Low back pain    chronic   Migraines    resolved on HRT for hot flases   Muscle spasm    very active, muscle spasm after tennis matches   Allergies  Allergen  Reactions   Penicillins Swelling    Has patient had a PCN reaction causing immediate rash, facial/tongue/throat swelling, SOB or lightheadedness with hypotension:YES Has patient had a PCN reaction causing severe rash involving mucus membranes or skin necrosis: NO Has patient had a PCN reaction that required hospitalization NO Has patient had a PCN reaction occurring within the last 10 years: NO If all of the above answers are "NO", then may proceed with Cephalosporin use.   Tape Rash    Pt is not sure if paper tape also causes the rash. Reaction was to surgical tape   Social History   Socioeconomic History   Marital status: Married    Spouse name: Not on file   Number of children: Not on file   Years of education: Not on file   Highest education level: Not on file  Occupational History   Not on file  Tobacco Use   Smoking status: Every Day    Current packs/day: 1.00    Average packs/day: 1 pack/day for 50.0 years (50.0 ttl pk-yrs)    Types: Cigarettes   Smokeless tobacco: Never   Tobacco comments:    Reports 5-10 cigs/day  Vaping Use   Vaping status: Never Used  Substance and Sexual Activity   Alcohol use: Yes    Comment: 2+ drinks per night, vodka or wine   Drug use: No   Sexual activity: Not on file  Other Topics Concern   Not on file  Social History Narrative   Work or School: 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      Home Situation: live with husband - boys off at college      Spiritual Beliefs: catholic      Lifestyle: lots of exercise - tennis; diet is healthy      Write with her left hand and does everything else with her right hand       Lives in a two story home    Social Drivers of Health   Financial Resource Strain: Low Risk  (10/09/2021)   Overall Financial Resource Strain (CARDIA)    Difficulty of Paying Living Expenses: Not hard at all  Food Insecurity: No Food Insecurity (10/09/2021)   Hunger Vital Sign    Worried About Running Out of Food  in the Last Year: Never true    Ran Out of Food in the Last Year: Never true  Transportation Needs: No Transportation Needs (10/09/2021)   PRAPARE - Administrator, Civil Service (Medical): No    Lack of Transportation (Non-Medical): No  Physical Activity: Sufficiently Active (10/09/2021)   Exercise Vital Sign    Days of Exercise per Week: 4 days    Minutes of Exercise per Session: 120 min  Stress: No Stress Concern Present (10/09/2021)   [employer] of Occupational Health - Occupational Stress Questionnaire    Feeling of Stress : Not at all  Social Connections: Moderately Isolated (05/20/2020)   Social Connection and  Isolation Panel [NHANES]    Frequency of Communication with Friends and Family: More than three times a week    Frequency of Social Gatherings with Friends and Family: Three times a week    Attends Religious Services: Never    Active Member of Clubs or Organizations: No    Attends Banker Meetings: Never    Marital Status: Married   Vitals:   08/13/23 1332  BP: 120/70  Pulse: 80  Resp: 16  Temp: 97.8 F (36.6 C)  SpO2: 97%   Body mass index is 25.93 kg/m.  Physical Exam Vitals and nursing note reviewed.  Constitutional:      General: She is not in acute distress.    Appearance: She is well-developed. She is not ill-appearing.  HENT:     Head: Normocephalic and atraumatic.     Nose: Congestion present. No rhinorrhea.     Right Turbinates: Not enlarged.     Left Turbinates: Not enlarged.     Mouth/Throat:     Mouth: Mucous membranes are moist.     Pharynx: Posterior oropharyngeal erythema (Difficult to examine due to gag reflex.) present. No oropharyngeal exudate.  Eyes:     Conjunctiva/sclera: Conjunctivae normal.  Cardiovascular:     Rate and Rhythm: Normal rate and regular rhythm.     Heart sounds: No murmur heard. Pulmonary:     Effort: Pulmonary effort is normal. No respiratory distress.     Breath sounds: Normal breath  sounds. No stridor.  Abdominal:     Palpations: Abdomen is soft. There is no mass.     Tenderness: There is no abdominal tenderness.  Lymphadenopathy:     Cervical: Cervical adenopathy (< 1 cm, bilateral, mildly tender) present.  Skin:    General: Skin is warm.     Findings: No erythema or rash.  Neurological:     General: No focal deficit present.     Mental Status: She is alert and oriented to person, place, and time.     Gait: Gait normal.  Psychiatric:        Mood and Affect: Mood and affect normal.   ASSESSMENT AND PLAN:  Pt was seen today for URI symptoms:  URI, acute Symptoms suggests a viral etiology, I explained patient that symptomatic treatment is usually recommended in this case, so I do not think abx is needed at this time. Instructed to monitor for signs of complications, including persistent fever among some, instructed about warning signs. I also explained that cough and nasal congestion can last a few days and sometimes weeks. Cough worse at night, she would like something with codeine that helps. Hycodan recommended at bedtime. F/U as needed.  -     HYDROcodone  Bit-Homatrop MBr; Take 5 mLs by mouth at bedtime as needed for up to 10 days for cough.  Dispense: 50 mL; Refill: 0  Wheezing Normal auscultation today. If wheezing reoccurs she can start Prednisone 40 mg daily x 3 d with breakfast. Albuterol  inh 2 puff every 6 hours for a week then as needed for wheezing or shortness of breath.  Smocking cessation encouraged. I do not think imaging is needed.  -     Albuterol  Sulfate HFA; Inhale 2 puffs into the lungs every 6 (six) hours as needed for wheezing or shortness of breath.  Dispense: 8 g; Refill: 0 -     predniSONE; Take 2 tablets (40 mg total) by mouth daily with breakfast for 3 days.  Dispense: 6 tablet; Refill: 0  Sore throat Rapid strep negative. Recommend throat lozenges.  -     POCT rapid strep A  Abdominal pain, generalized Could be associated  to acute viral illness. No other associated symptoms and pain not elicit with examination. Monitor for new symptoms. F/U with PCP is recurrent. Instructed about warning signs.  Return if symptoms worsen or fail to improve.  I, Fritz Jewel Wierda, acting as a scribe for Zuriah Bordas [place], MD., have documented all relevant documentation on the behalf of Ruairi Stutsman [place], MD, as directed by  Roseland Braun [place], MD while in the presence of Gemini Beaumier [place], MD.   I, Jaianna Nicoll [place], MD, have reviewed all documentation for this visit. The documentation on 08/13/23 for the exam, diagnosis, procedures, and orders are all accurate and complete.  Cutter Passey G. [place], MD  [facility]. [place] office.

## 2023-08-13 NOTE — Patient Instructions (Addendum)
 A few things to remember from today's visit:  URI, acute  Sore throat  Abdominal pain, generalized  Wheezing - Plan: albuterol  (VENTOLIN  HFA) 108 (90 Base) MCG/ACT inhaler, predniSONE (DELTASONE) 20 MG tablet  viral infections are self-limited and we treat each symptom depending of severity.  Over the counter medications as decongestants and cold medications usually help, they need to be taken with caution if there is a history of high blood pressure or palpitations. Tylenol  and/or Ibuprofen also helps with most symptoms (headache, muscle aching, fever,etc) Plenty of fluids. Honey helps with cough. Steam inhalations helps with runny nose, nasal congestion, and may prevent sinus infections. Cough and nasal congestion could last a few days and sometimes weeks. Please follow in not any better in 1-2 weeks or if symptoms get worse.  Albuterol  inh 2 puff every 6 hours for a week then as needed for wheezing or shortness of breath.  Prednisone for 3 days with breakfast if wheezing reocurs.  Do not use My Chart to request refills or for acute issues that need immediate attention. If you send a my chart message, it may take a few days to be addressed, specially if I am not in the office.  Please be sure medication list is accurate. If a new problem present, please set up appointment sooner than planned today.

## 2023-09-06 ENCOUNTER — Encounter: Payer: Self-pay | Admitting: Family Medicine

## 2023-09-06 ENCOUNTER — Ambulatory Visit (INDEPENDENT_AMBULATORY_CARE_PROVIDER_SITE_OTHER): Admitting: Family Medicine

## 2023-09-06 VITALS — BP 98/50 | HR 75 | Temp 98.3°F | Ht 63.0 in | Wt 148.9 lb

## 2023-09-06 DIAGNOSIS — J069 Acute upper respiratory infection, unspecified: Secondary | ICD-10-CM

## 2023-09-06 DIAGNOSIS — J209 Acute bronchitis, unspecified: Secondary | ICD-10-CM | POA: Diagnosis not present

## 2023-09-06 MED ORDER — PREDNISONE 20 MG PO TABS: ORAL_TABLET | ORAL | 0 refills | Status: AC

## 2023-09-06 MED ORDER — HYDROCODONE BIT-HOMATROP MBR 5-1.5 MG/5ML PO SOLN: 5.0000 mL | Freq: Every evening | ORAL | 0 refills | Status: AC | PRN

## 2023-09-06 MED ORDER — DOXYCYCLINE HYCLATE 100 MG PO TABS: 100.0000 mg | ORAL_TABLET | Freq: Two times a day (BID) | ORAL | 0 refills | Status: AC

## 2023-09-06 NOTE — Progress Notes (Unsigned)
   Acute Office Visit  Subjective:     Patient ID: Debra Trevino, female    DOB: 09-20-1955, 68 y.o.   MRN: [id number]  Chief Complaint  Patient presents with   Cough    Non-productive x1 month, complains of chest tightness and shortness of breath x1 month    Pt is reporting continued cough and tightness in the chest that is still bothersome. The cough is not very productive of mucus, maybe some mild nasal drip but no sinus congestion, no stuffiness. Was given an albuterol  inhaler and 3 day course of prednisone  which maybe helped some but the symptoms have persisted.   Cough   Patient is in today  Review of Systems  Respiratory:  Positive for cough.         Objective:    BP (!) 98/50   Pulse 75   Temp 98.3 F (36.8 C) (Oral)   Ht 5' 3 (1.6 m)   Wt 148 lb 14.4 oz (67.5 kg)   SpO2 96%   BMI 26.38 kg/m  {Vitals History (Optional):23777}  Physical Exam  No results found for any visits on 09/06/23.      Assessment & Plan:   Problem List Items Addressed This Visit   None Visit Diagnoses       Acute bronchitis, unspecified organism    -  Primary   Relevant Medications   doxycycline  (VIBRA -TABS) 100 MG tablet   predniSONE  (DELTASONE ) 20 MG tablet     URI, acute       Relevant Medications   HYDROcodone  bit-homatropine (HYCODAN) 5-1.5 MG/5ML syrup       Meds ordered this encounter  Medications   doxycycline  (VIBRA -TABS) 100 MG tablet    Sig: Take 1 tablet (100 mg total) by mouth 2 (two) times daily for 7 days.    Dispense:  14 tablet    Refill:  0   predniSONE  (DELTASONE ) 20 MG tablet    Sig: Take 3 tablets (60 mg total) by mouth daily for 2 days, THEN 2 tablets (40 mg total) daily for 2 days, THEN 1 tablet (20 mg total) daily for 2 days, THEN 0.5 tablets (10 mg total) daily for 2 days. Take 2 pills for 3 days, 1 pill for 4 days.    Dispense:  14 tablet    Refill:  0   HYDROcodone  bit-homatropine (HYCODAN) 5-1.5 MG/5ML syrup    Sig: Take 5 mLs by  mouth at bedtime as needed for up to 10 days for cough.    Dispense:  50 mL    Refill:  0    Return if symptoms worsen or fail to improve.  Heron CHRISTELLA Sharper, MD

## 2023-09-18 ENCOUNTER — Other Ambulatory Visit: Payer: Self-pay | Admitting: Family Medicine

## 2023-09-23 ENCOUNTER — Ambulatory Visit: Payer: Medicare Other | Admitting: Neurology

## 2023-09-25 ENCOUNTER — Encounter: Payer: Self-pay | Admitting: Internal Medicine

## 2023-11-27 ENCOUNTER — Ambulatory Visit
Admission: RE | Admit: 2023-11-27 | Discharge: 2023-11-27 | Disposition: A | Discharge status: Home / Self Care | Source: Ambulatory Visit | Attending: Acute Care | Admitting: Acute Care

## 2023-11-27 ENCOUNTER — Other Ambulatory Visit: Payer: Self-pay

## 2023-11-27 DIAGNOSIS — F17210 Nicotine dependence, cigarettes, uncomplicated: Secondary | ICD-10-CM | POA: Diagnosis not present

## 2023-11-27 DIAGNOSIS — Z87891 Personal history of nicotine dependence: Secondary | ICD-10-CM

## 2023-11-27 DIAGNOSIS — M810 Age-related osteoporosis without current pathological fracture: Secondary | ICD-10-CM

## 2023-11-27 DIAGNOSIS — Z122 Encounter for screening for malignant neoplasm of respiratory organs: Secondary | ICD-10-CM

## 2023-11-27 MED ORDER — DENOSUMAB 60 MG/ML ~~LOC~~ SOSY: 60.0000 mg | PREFILLED_SYRINGE | SUBCUTANEOUS | Status: AC

## 2023-11-27 NOTE — Progress Notes (Signed)
 Pt on bone density report. Order placed for PA.

## 2023-12-05 ENCOUNTER — Other Ambulatory Visit: Payer: Self-pay

## 2023-12-05 DIAGNOSIS — Z122 Encounter for screening for malignant neoplasm of respiratory organs: Secondary | ICD-10-CM

## 2023-12-05 DIAGNOSIS — F17210 Nicotine dependence, cigarettes, uncomplicated: Secondary | ICD-10-CM

## 2023-12-05 DIAGNOSIS — Z87891 Personal history of nicotine dependence: Secondary | ICD-10-CM

## 2023-12-20 DIAGNOSIS — Z23 Encounter for immunization: Secondary | ICD-10-CM | POA: Diagnosis not present

## 2023-12-25 ENCOUNTER — Telehealth: Payer: Self-pay

## 2023-12-25 NOTE — Telephone Encounter (Signed)
 Prolia  VOB initiated via [email or web address]  Next Prolia  inj DUE: 01/25/24

## 2023-12-30 ENCOUNTER — Ambulatory Visit: Payer: Self-pay

## 2023-12-30 NOTE — Telephone Encounter (Signed)
 FYI Only or Action Required?: FYI only for provider.  Patient was last seen in primary care on 09/06/2023 by Ozell Heron HERO, MD.  Called Nurse Triage reporting Dizziness.  Symptoms began several weeks ago.  Interventions attempted: Rest, hydration, or home remedies.  Symptoms are: unchanged.  Triage Disposition: See HCP Within 4 Hours (Or PCP Triage)  Patient/caregiver understands and will follow disposition?: Yes          Copied from CRM #[id number]. Topic: Clinical - Red Word Triage >> Dec 30, 2023  4:59 PM Wess RAMAN wrote: Red Word that prompted transfer to Nurse Triage: Patient is feeling dizzy, shakey, feeling lethargic Reason for Disposition  [1] Dizziness caused by heat exposure, sudden standing, or poor fluid intake AND [2] no improvement after 2 hours of rest and fluids  Answer Assessment - Initial Assessment Questions 1. DESCRIPTION: Describe your dizziness.     Sometimes if I stand up, I just sway a little bit - lasts about a minute 2. LIGHTHEADED: Do you feel lightheaded? (e.g., somewhat faint, woozy, weak upon standing)     Woozy, has to hold on to something during events 3. VERTIGO: Do you feel like either you or the room is spinning or tilting? (i.e., vertigo)     denies 4. SEVERITY: How bad is it?  Do you feel like you are going to faint? Can you stand and walk?     *No Answer* 5. ONSET:  When did the dizziness begin?     weeks 6. AGGRAVATING FACTORS: Does anything make it worse? (e.g., standing, change in head position)     Sitting to standing, walking 7. HEART RATE: Can you tell me your heart rate? How many beats in 15 seconds?  (Note: Not all patients can do this.)       N/a 8. CAUSE: What do you think is causing the dizziness? (e.g., decreased fluids or food, diarrhea, emotional distress, heat exposure, new medicine, sudden standing, vomiting; unknown)     Endorses has been drinking water. Denies V/D, medication changes. 9.  RECURRENT SYMPTOM: Have you had dizziness before? If Yes, ask: When was the last time? What happened that time?     First time 10. OTHER SYMPTOMS: Do you have any other symptoms? (e.g., fever, chest pain, vomiting, diarrhea, bleeding)       denies 11. PREGNANCY: Is there any chance you are pregnant? When was your last menstrual period?       N/a  Protocols used: Dizziness - Lightheadedness-A-AH

## 2023-12-31 ENCOUNTER — Encounter: Payer: Self-pay | Admitting: Family Medicine

## 2023-12-31 ENCOUNTER — Telehealth: Payer: Self-pay | Admitting: Acute Care

## 2023-12-31 ENCOUNTER — Ambulatory Visit: Payer: Self-pay | Admitting: Family Medicine

## 2023-12-31 ENCOUNTER — Ambulatory Visit (INDEPENDENT_AMBULATORY_CARE_PROVIDER_SITE_OTHER): Admitting: Family Medicine

## 2023-12-31 VITALS — BP 100/60 | HR 69 | Temp 97.5°F | Wt 147.9 lb

## 2023-12-31 DIAGNOSIS — R5383 Other fatigue: Secondary | ICD-10-CM | POA: Diagnosis not present

## 2023-12-31 DIAGNOSIS — R7303 Prediabetes: Secondary | ICD-10-CM

## 2023-12-31 DIAGNOSIS — R2689 Other abnormalities of gait and mobility: Secondary | ICD-10-CM | POA: Diagnosis not present

## 2023-12-31 DIAGNOSIS — R42 Dizziness and giddiness: Secondary | ICD-10-CM

## 2023-12-31 LAB — COMPREHENSIVE METABOLIC PANEL WITH GFR
ALT: 14 U/L (ref 0–35)
AST: 15 U/L (ref 0–37)
Albumin: 4.1 g/dL (ref 3.5–5.2)
Alkaline Phosphatase: 64 U/L (ref 39–117)
BUN: 22 mg/dL (ref 6–23)
CO2: 24 meq/L (ref 19–32)
Calcium: 9.2 mg/dL (ref 8.4–10.5)
Chloride: 104 meq/L (ref 96–112)
Creatinine, Ser: 0.91 mg/dL (ref 0.40–1.20)
GFR: 63.93 mL/min (ref >60.00)
Glucose, Bld: 104 mg/dL — ABNORMAL HIGH (ref 70–99)
Potassium: 3.8 meq/L (ref 3.5–5.1)
Sodium: 137 meq/L (ref 135–145)
Total Bilirubin: 0.4 mg/dL (ref 0.2–1.2)
Total Protein: 6.6 g/dL (ref 6.0–8.3)

## 2023-12-31 LAB — HEMOGLOBIN A1C: Hgb A1c MFr Bld: 5.9 % (ref 4.6–6.5)

## 2023-12-31 LAB — CBC WITH DIFFERENTIAL/PLATELET
Basophils Absolute: 0.1 K/uL (ref 0.0–0.1)
Basophils Relative: 0.9 % (ref 0.0–3.0)
Eosinophils Absolute: 0.3 K/uL (ref 0.0–0.7)
Eosinophils Relative: 5.2 % — ABNORMAL HIGH (ref 0.0–5.0)
HCT: 42.6 % (ref 36.0–46.0)
Hemoglobin: 14.3 g/dL (ref 12.0–15.0)
Lymphocytes Relative: 32.5 % (ref 12.0–46.0)
Lymphs Abs: 2.1 K/uL (ref 0.7–4.0)
MCHC: 33.6 g/dL (ref 30.0–36.0)
MCV: 92 fl (ref 78.0–100.0)
Monocytes Absolute: 0.5 K/uL (ref 0.1–1.0)
Monocytes Relative: 7 % (ref 3.0–12.0)
Neutro Abs: 3.6 K/uL (ref 1.4–7.7)
Neutrophils Relative %: 54.4 % (ref 43.0–77.0)
Platelets: 258 K/uL (ref 150.0–400.0)
RBC: 4.63 Mil/uL (ref 3.87–5.11)
RDW: 13.1 % (ref 11.5–15.5)
WBC: 6.6 K/uL (ref 4.0–10.5)

## 2023-12-31 LAB — TSH: TSH: 1.24 u[IU]/mL (ref 0.35–5.50)

## 2023-12-31 NOTE — Progress Notes (Signed)
 Established Patient Office Visit  Subjective   Patient ID: Debra Trevino, female    DOB: Dec 11, 1955  Age: 68 y.o. MRN: [id number]  Chief Complaint  Patient presents with   Dizziness    HPI   Debra Trevino is seen today with dizziness for really months but perhaps worse past couple weeks and frequently feeling off balance.  She has chronic problems including hypertension, history of meningioma, recurrent depression, mild peripheral neuropathy.  She does describe frequent lightheadedness with standing.  She has had vertigo in the past but states the symptoms are different.  Denies any chest pain or palpitations.  No recent syncope.  No recent falls.  No recent head injury.  No headaches.  She states she generally drinks 3 cups of caffeinated coffee per day and 1 soda and occasionally some milk but almost no water.  Does have history of hypertension and takes low-dose bisoprolol  HCTZ.  She is also having some general fatigue issues.  She has past history of prediabetes range blood sugars with no recent A1c.  Past Medical History:  Diagnosis Date   Bilateral foot pain    morton neuroma, seeing podiatrist   CTS (carpal tunnel syndrome)    has seen hand specilist   Depression 03/24/2007   Qualifier: Diagnosis of  By: Debra Trevino, Debra Trevino    Gastric ulcer    per patient   GERD (gastroesophageal reflux disease) 05/27/2014   Sees Dr. Luis; hx erosive esophagitis   Hot flashes    recur when stops HRT   HTN (hypertension)    Low back pain    chronic   Migraines    resolved on HRT for hot flases   Muscle spasm    very active, muscle spasm after tennis matches   Past Surgical History:  Procedure Laterality Date   CARPAL TUNNEL RELEASE     right hand   MEDIAL PARTIAL KNEE REPLACEMENT  2017   Right knee   REVISION TOTAL KNEE ARTHROPLASTY Left    02/2021   TONSILLECTOMY      reports that she has been smoking cigarettes. She has a 50 pack-year smoking history. She has never used  smokeless tobacco. She reports current alcohol use. She reports that she does not use drugs. family history includes Charcot-Marie-Tooth disease in her father; Hypertension in an other family member; Renal cancer in her mother; Schizophrenia in her brother. Allergies  Allergen Reactions   Penicillins Swelling    Has patient had a PCN reaction causing immediate rash, facial/tongue/throat swelling, SOB or lightheadedness with hypotension:YES Has patient had a PCN reaction causing severe rash involving mucus membranes or skin necrosis: NO Has patient had a PCN reaction that required hospitalization NO Has patient had a PCN reaction occurring within the last 10 years: NO If all of the above answers are NO, then may proceed with Cephalosporin use.   Tape Rash    Pt is not sure if paper tape also causes the rash. Reaction was to surgical tape    Review of Systems  Constitutional:  Positive for malaise/fatigue.  Respiratory:  Negative for shortness of breath.   Cardiovascular:  Negative for chest pain.  Gastrointestinal:  Negative for abdominal pain.  Genitourinary:  Negative for dysuria.  Neurological:  Positive for dizziness. Negative for speech change, focal weakness, seizures, loss of consciousness and headaches.      Objective:     BP 100/60   Pulse 69   Temp (!) 97.5 F (36.4 C) (Oral)  Wt 147 lb 14.4 oz (67.1 kg)   SpO2 97%   BMI 26.20 kg/m  BP Readings from Last 3 Encounters:  12/31/23 100/60  09/06/23 (!) 98/50  08/13/23 120/70   Wt Readings from Last 3 Encounters:  12/31/23 147 lb 14.4 oz (67.1 kg)  09/06/23 148 lb 14.4 oz (67.5 kg)  08/13/23 146 lb 6 oz (66.4 kg)      Physical Exam Vitals reviewed.  Constitutional:      Appearance: Normal appearance. She is well-developed.  Eyes:     Pupils: Pupils are equal, round, and reactive to light.  Neck:     Thyroid : No thyromegaly.     Vascular: No JVD.  Cardiovascular:     Rate and Rhythm: Normal rate and  regular rhythm.     Heart sounds:     No gallop.  Pulmonary:     Effort: Pulmonary effort is normal. No respiratory distress.     Breath sounds: Normal breath sounds. No wheezing or rales.  Musculoskeletal:     Cervical back: Neck supple.     Right lower leg: No edema.     Left lower leg: No edema.  Neurological:     General: No focal deficit present.     Mental Status: She is alert.     Cranial Nerves: No cranial nerve deficit.     Motor: No weakness.  Psychiatric:        Mood and Affect: Mood normal.      No results found for any visits on 12/31/23.  Last CBC Lab Results  Component Value Date   WBC 6.9 02/16/2022   HGB 14.4 02/16/2022   HCT 41.2 02/16/2022   MCV 93.2 02/16/2022   MCH 32.6 02/16/2022   RDW 12.3 02/16/2022   PLT 245 02/16/2022   Last metabolic panel Lab Results  Component Value Date   GLUCOSE 100 (H) 05/25/2022   NA 140 05/25/2022   K 4.6 05/25/2022   CL 105 05/25/2022   CO2 28 05/25/2022   BUN 19 05/25/2022   CREATININE 0.92 05/25/2022   GFR 63.81 05/25/2022   CALCIUM 9.8 05/25/2022   PROT 6.1 11/21/2022   ALBUMIN 4.0 05/25/2022   LABGLOB 2.7 05/25/2022   BILITOT 0.5 05/25/2022   ALKPHOS 84 05/25/2022   AST 18 05/25/2022   ALT 19 05/25/2022   ANIONGAP 10 04/20/2021   Last hemoglobin A1c Lab Results  Component Value Date   HGBA1C 5.7 05/25/2022   Last thyroid  functions Lab Results  Component Value Date   TSH 1.07 02/16/2022   Last vitamin B12 and Folate Lab Results  Component Value Date   VITAMINB12 1,037 (H) 05/25/2022   FOLATE 9.0 11/21/2022      The ASCVD Risk score (Arnett DK, et al., 2019) failed to calculate for the following reasons:   Cannot find a previous HDL lab   Cannot find a previous total cholesterol lab    Assessment & Plan:   Problem List Items Addressed This Visit   None Visit Diagnoses       Prediabetes    -  Primary   Relevant Orders   Hemoglobin A1c     Fatigue, unspecified type        Relevant Orders   TSH     Dizziness       Relevant Orders   CBC with Differential/Platelet   CMP     Debra Trevino presents with several month history of dizziness.  By description this is more lightheadedness than vestibular.  Her blood pressure today seated 110/52 and standing 108/50.  By description, her dizziness sounds to be more orthostatic though she did not have any significant changes today.  She is also drinking significant amount of caffeine and very little water.  She has some nonspecific fatigue which may also be related to inadequate fluid consumption.  She has prediabetes with no recent labs.  -Recommend additional labs including A1c, TSH, CBC, CMP - Gradually scaled back caffeine some and try to endeavor to 64 ounces of fluid per day with increased water consumption - May need to look at scaling back or discontinuing her bisoprolol  hydrochlorothiazide  - She has had some ongoing chronic balance issues which are certainly likely related in part to her chronic neuropathy.  Did discuss possible PT referral for balance training and she will consider.  No follow-ups on file.    Wolm Scarlet, Trevino

## 2023-12-31 NOTE — Telephone Encounter (Signed)
 Returned call from VM to review results of LDCT.  No answer. Left VM with our callback number

## 2023-12-31 NOTE — Patient Instructions (Signed)
 Consider gradually scaling back caffeine  Try to increase total daily fluid intake to around 64 ounces.

## 2024-01-01 ENCOUNTER — Other Ambulatory Visit (HOSPITAL_COMMUNITY): Payer: Self-pay

## 2024-01-01 NOTE — Telephone Encounter (Signed)
 SABRA

## 2024-01-01 NOTE — Telephone Encounter (Signed)
 Pt ready for scheduling for PROLIA  on or after : 01/25/24  Option# 1: Buy/Bill (Office supplied medication)  Out-of-pocket cost due at time of clinic visit: $0  Number of injection/visits approved: ---  Primary: MEDICARE Prolia  co-insurance: 0% Admin fee co-insurance: 0%  Secondary: [employer] Prolia  co-insurance:  Admin fee co-insurance:   Medical Benefit Details: Date Benefits were checked: 12/26/23 Deductible: $257 Met of $257 Required/ Coinsurance: 0%/ Admin Fee: 0%  Prior Auth: N/A PA# Expiration Date:   # of doses approved: ----------------------------------------------------------------------- Option# 2- Med Obtained from pharmacy:  Pharmacy benefit: Copay $937.11 (Paid to pharmacy) Admin Fee: 0% (Pay at clinic)  Prior Auth: N/A PA# Expiration Date:   # of doses approved:   If patient wants fill through the pharmacy benefit please send prescription to: [facility], and include estimated need by date in rx notes. Pharmacy will ship medication directly to the office.  Patient NOT eligible for Prolia  Copay Card. Copay Card can make patient's cost as little as $25. Link to apply: https://www.amgensupportplus.[email or web address]/copay  ** This summary of benefits is an estimation of the patient's out-of-pocket cost. Exact cost may very based on individual plan coverage.

## 2024-01-21 ENCOUNTER — Other Ambulatory Visit: Payer: Self-pay | Admitting: Family Medicine

## 2024-01-22 ENCOUNTER — Emergency Department (HOSPITAL_COMMUNITY)
Admission: EM | Admit: 2024-01-22 | Discharge: 2024-01-22 | Disposition: A | Discharge status: Home / Self Care | Attending: Emergency Medicine | Admitting: Emergency Medicine

## 2024-01-22 ENCOUNTER — Encounter (HOSPITAL_COMMUNITY): Payer: Self-pay

## 2024-01-22 ENCOUNTER — Ambulatory Visit (HOSPITAL_COMMUNITY)
Admission: EM | Admit: 2024-01-22 | Discharge: 2024-01-22 | Disposition: A | Discharge status: Home / Self Care | Attending: Internal Medicine | Admitting: Internal Medicine

## 2024-01-22 ENCOUNTER — Other Ambulatory Visit: Payer: Self-pay

## 2024-01-22 ENCOUNTER — Emergency Department (HOSPITAL_COMMUNITY)

## 2024-01-22 ENCOUNTER — Encounter (HOSPITAL_COMMUNITY): Payer: Self-pay | Admitting: Emergency Medicine

## 2024-01-22 DIAGNOSIS — Z471 Aftercare following joint replacement surgery: Secondary | ICD-10-CM | POA: Diagnosis not present

## 2024-01-22 DIAGNOSIS — S299XXA Unspecified injury of thorax, initial encounter: Secondary | ICD-10-CM | POA: Diagnosis not present

## 2024-01-22 DIAGNOSIS — M1812 Unilateral primary osteoarthritis of first carpometacarpal joint, left hand: Secondary | ICD-10-CM | POA: Diagnosis not present

## 2024-01-22 DIAGNOSIS — R55 Syncope and collapse: Secondary | ICD-10-CM | POA: Diagnosis not present

## 2024-01-22 DIAGNOSIS — I10 Essential (primary) hypertension: Secondary | ICD-10-CM | POA: Diagnosis not present

## 2024-01-22 DIAGNOSIS — R1012 Left upper quadrant pain: Secondary | ICD-10-CM | POA: Diagnosis not present

## 2024-01-22 DIAGNOSIS — Z79899 Other long term (current) drug therapy: Secondary | ICD-10-CM | POA: Diagnosis not present

## 2024-01-22 DIAGNOSIS — S0990XA Unspecified injury of head, initial encounter: Secondary | ICD-10-CM

## 2024-01-22 DIAGNOSIS — Y9373 Activity, racquet and hand sports: Secondary | ICD-10-CM | POA: Insufficient documentation

## 2024-01-22 DIAGNOSIS — W1830XA Fall on same level, unspecified, initial encounter: Secondary | ICD-10-CM | POA: Insufficient documentation

## 2024-01-22 DIAGNOSIS — W19XXXA Unspecified fall, initial encounter: Secondary | ICD-10-CM

## 2024-01-22 DIAGNOSIS — S60512A Abrasion of left hand, initial encounter: Secondary | ICD-10-CM | POA: Diagnosis not present

## 2024-01-22 DIAGNOSIS — S22038A Other fracture of third thoracic vertebra, initial encounter for closed fracture: Secondary | ICD-10-CM | POA: Diagnosis not present

## 2024-01-22 DIAGNOSIS — I6782 Cerebral ischemia: Secondary | ICD-10-CM | POA: Diagnosis not present

## 2024-01-22 DIAGNOSIS — S199XXA Unspecified injury of neck, initial encounter: Secondary | ICD-10-CM | POA: Diagnosis not present

## 2024-01-22 DIAGNOSIS — R0789 Other chest pain: Secondary | ICD-10-CM | POA: Insufficient documentation

## 2024-01-22 DIAGNOSIS — S22048A Other fracture of fourth thoracic vertebra, initial encounter for closed fracture: Secondary | ICD-10-CM | POA: Diagnosis not present

## 2024-01-22 DIAGNOSIS — S3991XA Unspecified injury of abdomen, initial encounter: Secondary | ICD-10-CM | POA: Diagnosis not present

## 2024-01-22 DIAGNOSIS — S0003XA Contusion of scalp, initial encounter: Secondary | ICD-10-CM | POA: Insufficient documentation

## 2024-01-22 DIAGNOSIS — R42 Dizziness and giddiness: Secondary | ICD-10-CM

## 2024-01-22 DIAGNOSIS — S80212A Abrasion, left knee, initial encounter: Secondary | ICD-10-CM | POA: Insufficient documentation

## 2024-01-22 DIAGNOSIS — S6992XA Unspecified injury of left wrist, hand and finger(s), initial encounter: Secondary | ICD-10-CM | POA: Diagnosis not present

## 2024-01-22 DIAGNOSIS — S8992XA Unspecified injury of left lower leg, initial encounter: Secondary | ICD-10-CM | POA: Diagnosis not present

## 2024-01-22 DIAGNOSIS — M19032 Primary osteoarthritis, left wrist: Secondary | ICD-10-CM | POA: Diagnosis not present

## 2024-01-22 DIAGNOSIS — S22030A Wedge compression fracture of third thoracic vertebra, initial encounter for closed fracture: Secondary | ICD-10-CM

## 2024-01-22 DIAGNOSIS — S22019A Unspecified fracture of first thoracic vertebra, initial encounter for closed fracture: Secondary | ICD-10-CM | POA: Diagnosis not present

## 2024-01-22 DIAGNOSIS — Z96652 Presence of left artificial knee joint: Secondary | ICD-10-CM | POA: Diagnosis not present

## 2024-01-22 DIAGNOSIS — S22029A Unspecified fracture of second thoracic vertebra, initial encounter for closed fracture: Secondary | ICD-10-CM | POA: Diagnosis not present

## 2024-01-22 DIAGNOSIS — R220 Localized swelling, mass and lump, head: Secondary | ICD-10-CM | POA: Diagnosis not present

## 2024-01-22 DIAGNOSIS — S3993XA Unspecified injury of pelvis, initial encounter: Secondary | ICD-10-CM | POA: Diagnosis not present

## 2024-01-22 DIAGNOSIS — M19042 Primary osteoarthritis, left hand: Secondary | ICD-10-CM | POA: Diagnosis not present

## 2024-01-22 LAB — URINALYSIS, ROUTINE W REFLEX MICROSCOPIC
Bacteria, UA: NONE SEEN
Bilirubin Urine: NEGATIVE
Glucose, UA: NEGATIVE mg/dL
Hgb urine dipstick: NEGATIVE
Ketones, ur: NEGATIVE mg/dL
Leukocytes,Ua: NEGATIVE
Nitrite: NEGATIVE
Protein, ur: NEGATIVE mg/dL
Specific Gravity, Urine: 1.004 — ABNORMAL LOW (ref 1.005–1.030)
pH: 5 (ref 5.0–8.0)

## 2024-01-22 LAB — CBC
HCT: 39.3 % (ref 36.0–46.0)
Hemoglobin: 13.7 g/dL (ref 12.0–15.0)
MCH: 31.6 pg (ref 26.0–34.0)
MCHC: 34.9 g/dL (ref 30.0–36.0)
MCV: 90.6 fL (ref 80.0–100.0)
Platelets: 242 K/uL (ref 150–400)
RBC: 4.34 MIL/uL (ref 3.87–5.11)
RDW: 12 % (ref 11.5–15.5)
WBC: 13.6 K/uL — ABNORMAL HIGH (ref 4.0–10.5)
nRBC: 0 % (ref 0.0–0.2)

## 2024-01-22 LAB — COMPREHENSIVE METABOLIC PANEL WITH GFR
ALT: 17 U/L (ref 0–44)
AST: 22 U/L (ref 15–41)
Albumin: 3.7 g/dL (ref 3.5–5.0)
Alkaline Phosphatase: 58 U/L (ref 38–126)
Anion gap: 13 (ref 5–15)
BUN: 20 mg/dL (ref 8–23)
CO2: 21 mmol/L — ABNORMAL LOW (ref 22–32)
Calcium: 9.1 mg/dL (ref 8.9–10.3)
Chloride: 102 mmol/L (ref 98–111)
Creatinine, Ser: 0.83 mg/dL (ref 0.44–1.00)
GFR, Estimated: >60 mL/min (ref >60)
Glucose, Bld: 98 mg/dL (ref 70–99)
Potassium: 3.6 mmol/L (ref 3.5–5.1)
Sodium: 136 mmol/L (ref 135–145)
Total Bilirubin: 0.2 mg/dL (ref 0.0–1.2)
Total Protein: 6.3 g/dL — ABNORMAL LOW (ref 6.5–8.1)

## 2024-01-22 LAB — I-STAT CHEM 8, ED
BUN: 22 mg/dL (ref 8–23)
Calcium, Ion: 1.19 mmol/L (ref 1.15–1.40)
Chloride: 104 mmol/L (ref 98–111)
Creatinine, Ser: 1 mg/dL (ref 0.44–1.00)
Glucose, Bld: 96 mg/dL (ref 70–99)
HCT: 39 % (ref 36.0–46.0)
Hemoglobin: 13.3 g/dL (ref 12.0–15.0)
Potassium: 3.6 mmol/L (ref 3.5–5.1)
Sodium: 137 mmol/L (ref 135–145)
TCO2: 22 mmol/L (ref 22–32)

## 2024-01-22 LAB — TROPONIN I (HIGH SENSITIVITY)
Troponin I (High Sensitivity): 4 ng/L (ref <18)
Troponin I (High Sensitivity): 5 ng/L (ref <18)

## 2024-01-22 LAB — ETHANOL: Alcohol, Ethyl (B): 45 mg/dL — ABNORMAL HIGH (ref <15)

## 2024-01-22 MED ORDER — HYDROMORPHONE HCL 1 MG/ML IJ SOLN
1.0000 mg | Freq: Once | INTRAMUSCULAR | Status: AC
  Administered 2024-01-22: 1 mg via INTRAVENOUS
  Filled 2024-01-22: qty 1

## 2024-01-22 MED ORDER — IOHEXOL 350 MG/ML SOLN
75.0000 mL | Freq: Once | INTRAVENOUS | Status: AC | PRN
  Administered 2024-01-22: 75 mL via INTRAVENOUS

## 2024-01-22 MED ORDER — CYCLOBENZAPRINE HCL 10 MG PO TABS
5.0000 mg | ORAL_TABLET | Freq: Once | ORAL | Status: AC
  Administered 2024-01-22: 5 mg via ORAL
  Filled 2024-01-22: qty 1

## 2024-01-22 MED ORDER — HYDROCODONE-ACETAMINOPHEN 5-325 MG PO TABS: 1.0000 | ORAL_TABLET | Freq: Four times a day (QID) | ORAL | 0 refills | Status: AC | PRN

## 2024-01-22 MED ORDER — ONDANSETRON HCL 4 MG/2ML IJ SOLN
4.0000 mg | Freq: Once | INTRAMUSCULAR | Status: AC
  Administered 2024-01-22: 4 mg via INTRAVENOUS
  Filled 2024-01-22: qty 2

## 2024-01-22 MED ORDER — MORPHINE SULFATE (PF) 4 MG/ML IV SOLN
4.0000 mg | Freq: Once | INTRAVENOUS | Status: AC
  Administered 2024-01-22: 4 mg via INTRAVENOUS
  Filled 2024-01-22: qty 1

## 2024-01-22 MED ORDER — TETANUS-DIPHTH-ACELL PERTUSSIS 5-2-15.5 LF-MCG/0.5 IM SUSP
0.5000 mL | Freq: Once | INTRAMUSCULAR | Status: AC
  Administered 2024-01-22: 0.5 mL via INTRAMUSCULAR
  Filled 2024-01-22: qty 0.5

## 2024-01-22 MED ORDER — CYCLOBENZAPRINE HCL 10 MG PO TABS: 10.0000 mg | ORAL_TABLET | Freq: Three times a day (TID) | ORAL | 0 refills | Status: AC | PRN

## 2024-01-22 NOTE — ED Notes (Signed)
 [facility] notified about transport needed to ED.

## 2024-01-22 NOTE — Progress Notes (Signed)
 Orthopedic Tech Progress Note Patient Details:  Debra Trevino 28-Oct-1955 [id number]  Ortho Devices Type of Ortho Device: Thoracolumbar corset (TLSO) Ortho Device/Splint Interventions: Ordered, Application, Adjustment   Post Interventions Patient Tolerated: Well Instructions Provided: Care of device, Adjustment of device  [name] 01/22/2024, 8:59 PM

## 2024-01-22 NOTE — ED Notes (Signed)
[facility] at bedside, report given. 

## 2024-01-22 NOTE — ED Notes (Signed)
 Ozell Charge RN at [facility] ED made aware that patient coming via [facility]

## 2024-01-22 NOTE — ED Provider Notes (Signed)
 Shawneequa Baldridge is a 68 y.o. female presenting for chief complaint of fall that happened today after lunch. She stood up, then became dizzy and fell forwards hitting her head on the pavement. She scraped her left knee and left hand/elbow during the fall as well. She cannot remember if she passed out or not. Additionally complains of pain to the left ribcage as a result of fall.   Denies nausea/vomiting/vision disturbance after head injury. She complains of pain to the crown of the head, denies bleeding/swelling to the area. Denies blood thinner use.  She remains dizzy with position changes and ambulation.  Denies unilateral extremity weakness, chest pain, shortness of breath, heart palpitations, paresthesias to the extremities.   Dizzy with ambulation in the urgent care. Non-focal neuro exam. Abrasions to the left knee, left hand, and left elbow. Tender to palpation over the medial left ribcage.   Discussed clinical concerns/exam findings leading to recommendation for further workup in the ER setting and risks of deferring ER visit with patient/family. Patient/family express understanding and agreement with plan, discharged to ER via [facility] due to dizziness and concern for safety with ambulation to the ER by herself.    Enedelia Dorna HERO, [place] 01/22/24 1635

## 2024-01-22 NOTE — ED Notes (Signed)
 Pt provided w/ d.c and f/u instructions, pt and spouse verbalized understanding, LDA removed, VSS, pt out of ED in NAD w/ paperwork and all belongings

## 2024-01-22 NOTE — ED Triage Notes (Signed)
 Pt reports that she was leaving lunch and got dizzy and fell. Pt doesn't think she lost consciousness. Pt reports hit head and having neck pain, left rib pain. Has abrasion to left knee left hand.

## 2024-01-22 NOTE — ED Notes (Signed)
 Pt able to ambulate w/ 1 assist approx 20 ft, safely returned to stretcher

## 2024-01-22 NOTE — ED Notes (Signed)
 Patient given warm blanket.

## 2024-01-22 NOTE — Discharge Instructions (Signed)
 As we discussed, you have compression fracture of the thoracic spine  You can take Tylenol  or Motrin for pain or Norco for severe pain  If you have muscle spasms you may use Flexeril  as prescribed  I recommend you stay hydrated  Please call Dr. Onetha for follow-up regarding your thoracic fracture  I recommend you wear your brace during the day  Return to ER if you have another fall or severe pain or passing out

## 2024-01-22 NOTE — ED Provider Notes (Signed)
 [place] EMERGENCY DEPARTMENT AT [facility] Provider Note   CSN: [id number] Arrival date & time: 01/22/24  1715     Patient presents with: No chief complaint on file.   Debra Trevino is a 68 y.o. female history of hypertension, here presenting with dizziness and syncope.  Patient states that she was playing tennis today.  She states that she got dizzy and fell and hit her head.  She also fell forward and hit her abdomen and chest as well.  She went to urgent care was sent in for further evaluation.  Patient states that she is not on blood thinners.  Complains of some hand pain and also chest and knee pain.  Denies any cardiac history   The history is provided by the patient.       Prior to Admission medications   Medication Sig Start Date End Date Taking? Authorizing Provider  Acetaminophen  (TYLENOL  PO) Take 1 tablet by mouth daily as needed (prior to physical therapy).    [provider]  albuterol  (VENTOLIN  HFA) 108 (90 Base) MCG/ACT inhaler Inhale 2 puffs into the lungs every 6 (six) hours as needed for wheezing or shortness of breath. 08/13/23   Jordan, Betty G, MD  bisoprolol -hydrochlorothiazide  (ZIAC ) 2.5-6.25 MG tablet TAKE 1 TABLET BY MOUTH EVERY DAY 09/18/23   Burchette, Wolm ORN, MD  buPROPion  (WELLBUTRIN  XL) 150 MG 24 hr tablet TAKE 1 TABLET BY MOUTH EVERY DAY 01/21/24   Burchette, Wolm ORN, MD  citalopram  (CELEXA ) 20 MG tablet TAKE 1 TABLET BY MOUTH EVERY DAY 06/26/23   Burchette, Wolm ORN, MD  cyclobenzaprine  (FLEXERIL ) 10 MG tablet TAKE 1 TABLET BY MOUTH THREE TIMES A DAY AS NEEDED FOR MUSCLE SPASMS 02/20/23   Burchette, Wolm ORN, MD  diclofenac Sodium (VOLTAREN) 1 % GEL Apply 1 application  topically 2 (two) times daily as needed (pain).    [provider]  gabapentin  (NEURONTIN ) 100 MG capsule Take 100 mg by mouth at bedtime.     [provider]  meloxicam  (MOBIC ) 15 MG tablet Take 15 mg by mouth every morning.    [provider]  omeprazole  (PRILOSEC) 20 MG capsule Take 1 capsule (20 mg total) by mouth every morning. 10/09/21   Burchette, Wolm ORN, MD    Allergies: Penicillins and Tape    Review of Systems  All other systems reviewed and are negative.   Updated Vital Signs BP 121/69   Pulse 63   Temp 97.7 F (36.5 C) (Oral)   Resp 11   Ht 5' 3 (1.6 m)   Wt 68 kg   SpO2 96%   BMI 26.57 kg/m   Physical Exam Vitals and nursing note reviewed.  HENT:     Head:     Comments: Frontal scalp hematoma    Nose: Nose normal.     Mouth/Throat:     Mouth: Mucous membranes are moist.  Eyes:     Extraocular Movements: Extraocular movements intact.     Pupils: Pupils are equal, round, and reactive to light.  Neck:     Comments: C-collar in place Cardiovascular:     Rate and Rhythm: Normal rate and regular rhythm.     Pulses: Normal pulses.     Heart sounds: Normal heart sounds.  Pulmonary:     Effort: Pulmonary effort is normal.     Comments: Mild tenderness in the left lower chest wall and upper abdomen area Abdominal:     Comments: Tenderness in the left  upper quadrant.  No obvious bruising  Musculoskeletal:     Comments: Abrasion of the left knee and also left hand.  No obvious spinal tenderness  Skin:    General: Skin is warm.     Capillary Refill: Capillary refill takes less than 2 seconds.  Neurological:     General: No focal deficit present.     Mental Status: She is alert and oriented to person, place, and time.  Psychiatric:        Mood and Affect: Mood normal.        Behavior: Behavior normal.     (all labs ordered are listed, but only abnormal results are displayed) Labs Reviewed  CBC - Abnormal; Notable for the following components:      Result Value   WBC 13.6 (*)    All other components within normal limits  URINALYSIS, ROUTINE W REFLEX MICROSCOPIC - Abnormal; Notable for the following components:   Color, Urine STRAW (*)    Specific Gravity, Urine 1.004 (*)    All other  components within normal limits  COMPREHENSIVE METABOLIC PANEL WITH GFR  ETHANOL  I-STAT CHEM 8, ED  TROPONIN I (HIGH SENSITIVITY)    EKG: EKG Interpretation Date/Time:  Wednesday January 22 2024 17:24:59 EST Ventricular Rate:  66 PR Interval:  199 QRS Duration:  96 QT Interval:  425 QTC Calculation: 446 R Axis:   73  Text Interpretation: Sinus rhythm Consider left atrial enlargement No significant change since last tracing Confirmed by Patt Alm DEL [phone]) on 01/22/2024 5:52:05 PM  Radiology: [facility] Pelvis Portable Result Date: 01/22/2024 EXAM: 1 or 2 VIEW(S) XRAY OF THE PELVIS 01/22/2024 05:38:00 PM COMPARISON: None available. CLINICAL HISTORY: Trauma FINDINGS: BONES AND JOINTS: Both hips are normally located. No acute hip fracture. The pubic symphysis and si joints are intact. No pelvic fractures. SOFT TISSUES: The soft tissues are unremarkable. IMPRESSION: 1. No evidence of acute traumatic injury. Electronically signed by: Maude Stammer MD 01/22/2024 06:14 PM EST RP Workstation: [id number]   DG Hand Complete Left Result Date: 01/22/2024 EXAM: 3 OR MORE VIEW(S) XRAY OF THE LEFT HAND 01/22/2024 05:38:00 PM COMPARISON: None available. CLINICAL HISTORY: Trauma FINDINGS: BONES AND JOINTS: Severe osteoarthritis of the radiocarpal joint, with partial collapse of the scaphoid and proximal migration of the lunate. Severe osteoarthritis of the first carpometacarpal and triscaphe joints. SOFT TISSUES: The soft tissues are unremarkable. IMPRESSION: 1. No acute osseous abnormality. 2. Advanced degenerative changes involving the wrist and carpometacarpal joint of the thumb. Electronically signed by: Maude Stammer MD 01/22/2024 06:13 PM EST RP Workstation: [id number]   DG Knee Left Port Result Date: 01/22/2024 EXAM: 1 OR 2 VIEW(S) XRAY OF THE LEFT KNEE 01/22/2024 05:38:00 PM COMPARISON: None available. CLINICAL HISTORY: Blunt Trauma FINDINGS: BONES AND JOINTS: No acute fracture or dislocation.  Status post left total knee arthroplasty with normal alignment. SOFT TISSUES: Prepatellar soft tissue swelling. IMPRESSION: 1. No acute osseous abnormality. 2. Intact left total knee arthroplasty. Electronically signed by: Harrietta Sherry MD 01/22/2024 06:12 PM EST RP Workstation: [id number]   DG Chest Port 1 View Result Date: 01/22/2024 CLINICAL DATA:  Trauma EXAM: PORTABLE CHEST 1 VIEW COMPARISON:  Chest x-ray 10/09/2017 FINDINGS: The heart size and mediastinal contours are within normal limits. Both lungs are clear. The visualized skeletal structures are unremarkable. IMPRESSION: No active disease. Electronically Signed   By: Greig Pique M.D.   On: 01/22/2024 18:10     Procedures   Medications Ordered in the ED  Tdap (ADACEL) injection 0.5  mL (0.5 mLs Intramuscular Given 01/22/24 1750)  morphine (PF) 4 MG/ML injection 4 mg (4 mg Intravenous Given 01/22/24 1750)  ondansetron (ZOFRAN) injection 4 mg (4 mg Intravenous Given 01/22/24 1750)                                    Medical Decision Making Debra Trevino is a 68 y.o. female here presenting with syncope with fall.  Patient does have head injury and also tenderness in the chest and abdomen.  Consider intracranial bleeding versus cervical fracture versus pneumothorax versus hemothorax versus intra-abdominal bleeding versus contusion.  Plan to do trauma scan with CT head and cervical spine and CT chest abdomen pelvis.  Will get extremity x-rays as well.  9:33 PM I reviewed patient's labs and alcohol was 45.  Chemistry otherwise unremarkable.  Troponin negative x 2.  Trauma scan showed acute compression fracture of T3 and T4.  Patient was able to tolerate TLSO brace.  Patient is able to ambulate with it.  Pain is under control now.  Will discharge home with pain medicine and muscle relaxant.  Patient has seen Dr. Onetha from neurosurgery previously and can follow-up with them.  Regarding the near syncope, patient did not drink much  fluids after playing tennis I think likely dehydration causing symptoms.  Problems Addressed: Compression fracture of T3 vertebra, initial encounter [facility]): acute illness or injury Near syncope: acute illness or injury  Amount and/or Complexity of Data Reviewed Labs: ordered. Decision-making details documented in ED Course. Radiology: ordered and independent interpretation performed. Decision-making details documented in ED Course. ECG/medicine tests: ordered and independent interpretation performed. Decision-making details documented in ED Course.  Risk Prescription drug management.    Final diagnoses:  None    ED Discharge Orders     None          Patt Alm Macho, MD 01/22/24 2138

## 2024-01-22 NOTE — ED Notes (Signed)
 Patient is being discharged from the Urgent Care and sent to the Emergency Department via [facility]. Per Rosaline Silk, NP, patient is in need of higher level of care due to fall, hit head, dizziness. Patient is aware and verbalizes understanding of plan of care.  Vitals:   01/22/24 1607  BP: 112/68  Pulse: 62  Resp: 18  Temp: 97.7 F (36.5 C)  SpO2: 97%

## 2024-01-22 NOTE — ED Triage Notes (Addendum)
 Pt walking when she got dizzy and fell, hit left side head, wrist and knee. Unknown LOC, is not on blood thinners. Pt w/ C collar in place via EMS for c spine tenderness 5/10

## 2024-01-23 ENCOUNTER — Other Ambulatory Visit: Payer: Self-pay | Admitting: Family Medicine

## 2024-01-30 ENCOUNTER — Ambulatory Visit

## 2024-01-30 DIAGNOSIS — S22030D Wedge compression fracture of third thoracic vertebra, subsequent encounter for fracture with routine healing: Secondary | ICD-10-CM | POA: Diagnosis not present

## 2024-01-30 DIAGNOSIS — M4310 Spondylolisthesis, site unspecified: Secondary | ICD-10-CM | POA: Diagnosis not present

## 2024-01-30 DIAGNOSIS — M48061 Spinal stenosis, lumbar region without neurogenic claudication: Secondary | ICD-10-CM | POA: Diagnosis not present

## 2024-02-12 ENCOUNTER — Ambulatory Visit: Admitting: *Deleted

## 2024-02-12 DIAGNOSIS — M810 Age-related osteoporosis without current pathological fracture: Secondary | ICD-10-CM | POA: Diagnosis not present

## 2024-02-12 MED ORDER — DENOSUMAB 60 MG/ML ~~LOC~~ SOSY
60.0000 mg | PREFILLED_SYRINGE | Freq: Once | SUBCUTANEOUS | Status: AC
  Administered 2024-02-12: 60 mg via SUBCUTANEOUS

## 2024-02-12 MED ORDER — DENOSUMAB 60 MG/ML ~~LOC~~ SOSY: 60.0000 mg | PREFILLED_SYRINGE | SUBCUTANEOUS | Status: AC

## 2024-02-12 NOTE — Progress Notes (Signed)
Per orders of Dr. Burchette, injection of Prolia given by Jaciel Diem. Patient tolerated injection well. 

## 2024-02-20 DIAGNOSIS — Z6826 Body mass index (BMI) 26.0-26.9, adult: Secondary | ICD-10-CM | POA: Diagnosis not present

## 2024-02-20 DIAGNOSIS — S22030D Wedge compression fracture of third thoracic vertebra, subsequent encounter for fracture with routine healing: Secondary | ICD-10-CM | POA: Diagnosis not present

## 2024-02-25 ENCOUNTER — Other Ambulatory Visit: Payer: Self-pay | Admitting: Family Medicine

## 2024-04-08 ENCOUNTER — Telehealth: Payer: Self-pay

## 2024-04-08 NOTE — Telephone Encounter (Signed)
 Error crm

## 2024-04-09 ENCOUNTER — Encounter: Payer: Self-pay | Admitting: Family Medicine

## 2024-04-09 MED ORDER — BISOPROLOL-HYDROCHLOROTHIAZIDE 2.5-6.25 MG PO TABS: 1.0000 | ORAL_TABLET | Freq: Every day | ORAL | 1 refills | Status: AC
